# Patient Record
Sex: Female | Born: 1985 | Race: White | Hispanic: No | Smoking: Former smoker
Health system: Southern US, Community
[De-identification: ages and names within clinical notes are randomized; demographics above are authoritative.]

## PROBLEM LIST (undated history)

## (undated) ENCOUNTER — Inpatient Hospital Stay (HOSPITAL_COMMUNITY): Payer: Self-pay

## (undated) DIAGNOSIS — Z8751 Personal history of pre-term labor: Secondary | ICD-10-CM

## (undated) DIAGNOSIS — G4730 Sleep apnea, unspecified: Secondary | ICD-10-CM

## (undated) DIAGNOSIS — G709 Myoneural disorder, unspecified: Secondary | ICD-10-CM

## (undated) DIAGNOSIS — G500 Trigeminal neuralgia: Secondary | ICD-10-CM

## (undated) DIAGNOSIS — J45909 Unspecified asthma, uncomplicated: Secondary | ICD-10-CM

## (undated) DIAGNOSIS — R569 Unspecified convulsions: Secondary | ICD-10-CM

## (undated) DIAGNOSIS — D259 Leiomyoma of uterus, unspecified: Secondary | ICD-10-CM

## (undated) DIAGNOSIS — F419 Anxiety disorder, unspecified: Secondary | ICD-10-CM

## (undated) DIAGNOSIS — T7840XA Allergy, unspecified, initial encounter: Secondary | ICD-10-CM

## (undated) DIAGNOSIS — C801 Malignant (primary) neoplasm, unspecified: Secondary | ICD-10-CM

## (undated) DIAGNOSIS — G43909 Migraine, unspecified, not intractable, without status migrainosus: Secondary | ICD-10-CM

## (undated) DIAGNOSIS — K219 Gastro-esophageal reflux disease without esophagitis: Secondary | ICD-10-CM

## (undated) HISTORY — DX: Sleep apnea, unspecified: G47.30

## (undated) HISTORY — DX: Trigeminal neuralgia: G50.0

## (undated) HISTORY — DX: Unspecified asthma, uncomplicated: J45.909

## (undated) HISTORY — DX: Personal history of pre-term labor: Z87.51

## (undated) HISTORY — PX: OTHER SURGICAL HISTORY: SHX169

## (undated) HISTORY — DX: Anxiety disorder, unspecified: F41.9

## (undated) HISTORY — DX: Myoneural disorder, unspecified: G70.9

## (undated) HISTORY — PX: BREAST SURGERY: SHX581

## (undated) HISTORY — DX: Unspecified convulsions: R56.9

## (undated) HISTORY — DX: Gastro-esophageal reflux disease without esophagitis: K21.9

## (undated) HISTORY — DX: Allergy, unspecified, initial encounter: T78.40XA

## (undated) HISTORY — DX: Migraine, unspecified, not intractable, without status migrainosus: G43.909

## (undated) HISTORY — PX: COSMETIC SURGERY: SHX468

## (undated) HISTORY — DX: Malignant (primary) neoplasm, unspecified: C80.1

---
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
## 2006-01-05 ENCOUNTER — Ambulatory Visit: Payer: Self-pay | Admitting: Internal Medicine

## 2006-01-06 ENCOUNTER — Ambulatory Visit: Payer: Self-pay | Admitting: Internal Medicine

## 2006-01-07 ENCOUNTER — Ambulatory Visit: Payer: Self-pay | Admitting: Internal Medicine

## 2006-01-12 ENCOUNTER — Ambulatory Visit: Payer: Self-pay | Admitting: Internal Medicine

## 2006-01-22 ENCOUNTER — Ambulatory Visit: Payer: Self-pay | Admitting: Cardiology

## 2006-02-02 ENCOUNTER — Ambulatory Visit: Payer: Self-pay | Admitting: Internal Medicine

## 2006-02-26 ENCOUNTER — Ambulatory Visit: Payer: Self-pay | Admitting: Internal Medicine

## 2006-04-13 HISTORY — PX: BREAST ENHANCEMENT SURGERY: SHX7

## 2006-06-07 ENCOUNTER — Other Ambulatory Visit: Admission: RE | Admit: 2006-06-07 | Discharge: 2006-06-07 | Payer: Self-pay | Admitting: Obstetrics and Gynecology

## 2006-07-26 ENCOUNTER — Ambulatory Visit: Payer: Self-pay | Admitting: Internal Medicine

## 2006-11-06 ENCOUNTER — Encounter: Payer: Self-pay | Admitting: Internal Medicine

## 2006-11-06 DIAGNOSIS — J45909 Unspecified asthma, uncomplicated: Secondary | ICD-10-CM | POA: Insufficient documentation

## 2006-11-06 DIAGNOSIS — G43909 Migraine, unspecified, not intractable, without status migrainosus: Secondary | ICD-10-CM | POA: Insufficient documentation

## 2007-03-16 ENCOUNTER — Encounter: Payer: Self-pay | Admitting: Internal Medicine

## 2007-04-22 ENCOUNTER — Ambulatory Visit: Payer: Self-pay | Admitting: Internal Medicine

## 2007-04-22 DIAGNOSIS — J309 Allergic rhinitis, unspecified: Secondary | ICD-10-CM | POA: Insufficient documentation

## 2007-05-13 ENCOUNTER — Encounter: Payer: Self-pay | Admitting: Internal Medicine

## 2007-05-16 ENCOUNTER — Telehealth: Payer: Self-pay | Admitting: Internal Medicine

## 2007-05-23 ENCOUNTER — Encounter: Payer: Self-pay | Admitting: Internal Medicine

## 2007-06-29 ENCOUNTER — Telehealth: Payer: Self-pay | Admitting: Internal Medicine

## 2007-07-06 ENCOUNTER — Ambulatory Visit: Payer: Self-pay | Admitting: Internal Medicine

## 2007-07-07 ENCOUNTER — Telehealth: Payer: Self-pay | Admitting: Internal Medicine

## 2007-07-08 ENCOUNTER — Encounter: Payer: Self-pay | Admitting: Internal Medicine

## 2007-08-12 ENCOUNTER — Encounter: Payer: Self-pay | Admitting: Internal Medicine

## 2007-09-14 ENCOUNTER — Encounter: Payer: Self-pay | Admitting: Internal Medicine

## 2007-11-01 ENCOUNTER — Other Ambulatory Visit: Admission: RE | Admit: 2007-11-01 | Discharge: 2007-11-01 | Payer: Self-pay | Admitting: Obstetrics and Gynecology

## 2008-01-18 ENCOUNTER — Telehealth (INDEPENDENT_AMBULATORY_CARE_PROVIDER_SITE_OTHER): Payer: Self-pay | Admitting: *Deleted

## 2008-02-02 ENCOUNTER — Telehealth: Payer: Self-pay | Admitting: Internal Medicine

## 2008-07-27 ENCOUNTER — Ambulatory Visit: Payer: Self-pay | Admitting: Internal Medicine

## 2008-07-30 ENCOUNTER — Telehealth: Payer: Self-pay | Admitting: Internal Medicine

## 2008-08-06 ENCOUNTER — Telehealth: Payer: Self-pay | Admitting: Family Medicine

## 2008-08-07 ENCOUNTER — Telehealth: Payer: Self-pay | Admitting: Internal Medicine

## 2009-05-31 ENCOUNTER — Ambulatory Visit: Payer: Self-pay | Admitting: Internal Medicine

## 2009-05-31 DIAGNOSIS — J019 Acute sinusitis, unspecified: Secondary | ICD-10-CM | POA: Insufficient documentation

## 2010-01-31 ENCOUNTER — Telehealth: Payer: Self-pay | Admitting: Internal Medicine

## 2010-05-04 ENCOUNTER — Encounter: Payer: Self-pay | Admitting: Internal Medicine

## 2010-05-13 NOTE — Assessment & Plan Note (Signed)
Summary: SINUS PRESSURE,DRAINAGE-LB   Vital Signs:  Patient profile:   25 year old female Height:      63 inches Weight:      122 pounds BMI:     21.69 Temp:     98.5 degrees F oral Pulse rate:   88 / minute Pulse rhythm:   regular BP sitting:   100 / 70  (left arm) Cuff size:   regular  Vitals Entered By: Lamar Sprinkles, CMA (May 31, 2009 8:36 AM) CC: Pt c/o sinus pressure & pain. Symptoms started wednesday. She is taking dayquil, and has tried sudafed & benadryl w/no relief. No fever., URI symptoms   Primary Care Provider:  [facility]  CC:  Pt c/o sinus pressure & pain. Symptoms started wednesday. She is taking dayquil, and has tried sudafed & benadryl w/no relief. No fever., and URI symptoms.  History of Present Illness:  URI Symptoms      This is a 25 year old woman who presents with URI symptoms.  The symptoms began 3 days ago.  The severity is described as moderate.  The patient reports nasal congestion, purulent nasal discharge, dry cough, and sick contacts, but denies sore throat, productive cough, and earache.  The patient denies fever, stiff neck, dyspnea, wheezing, rash, vomiting, diarrhea, and use of an antipyretic.  The patient also reports severe fatigue.  The patient denies headache and muscle aches.  Risk factors for Strep sinusitis include unilateral facial pain, unilateral nasal discharge, and Strep exposure.  The patient denies the following risk factors for Strep sinusitis: tender adenopathy and absence of cough.    Preventive Screening-Counseling & Management  Alcohol-Tobacco     Alcohol drinks/day: 0     Smoking Status: never  Caffeine-Diet-Exercise     Does Patient Exercise: no  Hep-HIV-STD-Contraception     Hepatitis Risk: no risk noted     HIV Risk: no risk noted     STD Risk: no risk noted      Sexual History:  currently monogamous.        Drug Use:  no.        Blood Transfusions:  no.    Medications Prior to Update: 1)  Advair Diskus 100-50  Mcg/dose  Misc (Fluticasone-Salmeterol) .... Two Times A Day 2)  Ativan 1 Mg  Tabs (Lorazepam) .Marland Kitchen.. 1 Two Times A Day As Needed 3)  Maxalt-Mlt 10 Mg  Tbdp (Rizatriptan Benzoate) .... As Needed 4)  Topamax 50 Mg  Tabs (Topiramate) .... 6 Tabs At Hs 5)  Ortho Micronor 0.35 Mg  Tabs (Norethindrone (Contraceptive)) .... As Dirr 6)  Xopenex Hfa 45 Mcg/act Aero (Levalbuterol Tartrate) .... 2 Inh Two Times A Day As Needed 7)  Flexeril 10 Mg Tabs (Cyclobenzaprine Hcl) .Marland Kitchen.. 1 By Mouth Two Times A Day Prn 8)  Vitamin D3 1000 Unit  Tabs (Cholecalciferol) .Marland Kitchen.. 1 Qd 9)  Zyrtec-D Allergy & Congestion 5-120 Mg Xr12h-Tab (Cetirizine-Pseudoephedrine) 10)  Clindamycin Phosphate 1 % Gel (Clindamycin Phosphate) .... Use Bid 11)  Transderm-Scop 1.5 Mg Pt72 (Scopolamine Base) .Marland Kitchen.. 1 Q 3d Prn 12)  Hydrocodone-Acetaminophen 5-325 Mg Tabs (Hydrocodone-Acetaminophen) .Marland Kitchen.. 1 By Mouth Up To 4 Times Per Day As Needed For Pain  Current Medications (verified): 1)  Topamax 100 Mg Tabs (Topiramate) .... 3 By Mouth Qhs 2)  Vitamin D3 1000 Unit  Tabs (Cholecalciferol) .Marland Kitchen.. 1 Qd 3)  Zyrtec-D Allergy & Congestion 5-120 Mg Xr12h-Tab (Cetirizine-Pseudoephedrine) 4)  Clindamycin Phosphate 1 % Gel (Clindamycin Phosphate) .... Use Bid 5)  Trileptal  150 Mg Tabs (Oxcarbazepine) .... 3 At Bedtime 6)  Neurontin 800 Mg Tabs (Gabapentin) .... 3 To 4 Daily 7)  Vit E .... Daily  Allergies (verified): 1)  ! Sulfa 2)  ! Codeine 3)  ! * Amitriptyline 4)  ! Erythromycin 5)  ! Paxil  Past History:  Past Medical History: Reviewed history from 04/22/2007 and no changes required. Asthma Migraines Allergic rhinitis   Dr Thomasena Edis gyn Dr Neale Burly HA  Past Surgical History: Denies surgical history  Family History: Reviewed history from 04/22/2007 and no changes required. Family Hsitory Headaches  Social History: Reviewed history from 04/22/2007 and no changes required. Occupation: Pharm. Tech. Single Never Smoked Alcohol  use-no Drug use-no Regular exercise-no Hepatitis Risk:  no risk noted HIV Risk:  no risk noted STD Risk:  no risk noted Sexual History:  currently monogamous Blood Transfusions:  no Drug Use:  no Does Patient Exercise:  no  Review of Systems  The patient denies anorexia, fever, weight loss, weight gain, chest pain, dyspnea on exertion, headaches, hemoptysis, abdominal pain, hematuria, depression, and enlarged lymph nodes.    Physical Exam  General:  alert, well-developed, well-nourished, well-hydrated, appropriate dress, normal appearance, healthy-appearing, and cooperative to examination.   Head:  normocephalic, atraumatic, no abnormalities observed, and no abnormalities palpated.   Ears:  R ear normal and L ear normal.   Nose:  no airflow obstruction, no intranasal foreign body, no nasal polyps, no nasal mucosal lesions, no mucosal friability, no active bleeding or clots, no septum abnormalities, nasal dischargemucosal pallor, mucosal erythema, mucosal edema, L maxillary sinus tenderness, and R maxillary sinus tenderness.   Mouth:  Oral mucosa and oropharynx without lesions or exudates.  Teeth in good repair. Neck:  No deformities, masses, or tenderness noted.no cervical lymphadenopathy and no neck tenderness.   Lungs:  Normal respiratory effort, chest expands symmetrically. Lungs are clear to auscultation, no crackles or wheezes. Heart:  Normal rate and regular rhythm. S1 and S2 normal without gallop, murmur, click, rub or other extra sounds. Abdomen:  Bowel sounds positive,abdomen soft and non-tender without masses, organomegaly or hernias noted. Msk:  No deformity or scoliosis noted of thoracic or lumbar spine.   Pulses:  R and L carotid,radial,femoral,dorsalis pedis and posterior tibial pulses are full and equal bilaterally Extremities:  No clubbing, cyanosis, edema, or deformity noted with normal full range of motion of all joints.   Neurologic:  No cranial nerve deficits noted.  Station and gait are normal. Plantar reflexes are down-going bilaterally. DTRs are symmetrical throughout. Sensory, motor and coordinative functions appear intact. Skin:  Intact without suspicious lesions or rashes Cervical Nodes:  no anterior cervical adenopathy and no posterior cervical adenopathy.   Axillary Nodes:  no R axillary adenopathy and no L axillary adenopathy.   Psych:  Cognition and judgment appear intact. Alert and cooperative with normal attention span and concentration. No apparent delusions, illusions, hallucinations   Impression & Recommendations:  Problem # 1:  SINUSITIS- ACUTE-NOS (ICD-461.9) Assessment New  Her updated medication list for this problem includes:    Zyrtec-d Allergy & Congestion 5-120 Mg Xr12h-tab (Cetirizine-pseudoephedrine)    Amoxicillin 500 Mg Cap (Amoxicillin) .Marland Kitchen... Take 1 capsule by mouth three times a day x 10 days    Hydrocodone-homatropine 5-1.5 Mg/43ml Syrp (Hydrocodone-homatropine) .Marland Kitchen... 5-10 ml by mouth qid as needed for cough  Instructed on treatment. Call if symptoms persist or worsen.   Complete Medication List: 1)  Topamax 100 Mg Tabs (Topiramate) .... 3 by mouth qhs 2)  Vitamin D3 1000 Unit Tabs (Cholecalciferol) .Marland Kitchen.. 1 qd 3)  Zyrtec-d Allergy & Congestion 5-120 Mg Xr12h-tab (Cetirizine-pseudoephedrine) 4)  Clindamycin Phosphate 1 % Gel (Clindamycin phosphate) .... Use bid 5)  Trileptal 150 Mg Tabs (Oxcarbazepine) .... 3 at bedtime 6)  Neurontin 800 Mg Tabs (Gabapentin) .... 3 to 4 daily 7)  Vit E  .... Daily 8)  Amoxicillin 500 Mg Cap (Amoxicillin) .... Take 1 capsule by mouth three times a day x 10 days 9)  Hydrocodone-homatropine 5-1.5 Mg/54ml Syrp (Hydrocodone-homatropine) .... 5-10 ml by mouth qid as needed for cough  Patient Instructions: 1)  Please schedule a follow-up appointment in 2 weeks. 2)  Take your antibiotic as prescribed until ALL of it is gone, but stop if you develop a rash or swelling and contact our office as  soon as possible. 3)  Acute sinusitis symptoms for less than 10 days are not helped by antibiotics.Use warm moist compresses, and over the counter decongestants ( only as directed). Call if no improvement in 5-7 days, sooner if increasing pain, fever, or new symptoms. Prescriptions: HYDROCODONE-HOMATROPINE 5-1.5 MG/5ML SYRP (HYDROCODONE-HOMATROPINE) 5-10 ml by mouth QID as needed for cough  #6 ounces x 0   Entered and Authorized by:   Etta Grandchild MD   Signed by:   Etta Grandchild MD on 05/31/2009   Method used:   Print then Give to Patient   RxID:   [id number] AMOXICILLIN 500 MG CAP (AMOXICILLIN) Take 1 capsule by mouth three times a day X 10 days  #30 x 0   Entered and Authorized by:   Etta Grandchild MD   Signed by:   Etta Grandchild MD on 05/31/2009   Method used:   Print then Give to Patient   RxID:   [id number]

## 2010-05-13 NOTE — Letter (Signed)
Summary: Out of Work  [facility]  [address] [place], [place] [address]   Phone: [phone]  Fax: [phone]    May 31, 2009   Employee:  Sharon King    To Whom It May Concern:   For Medical reasons, please excuse the above named employee from work for the following dates:  Start:   05/30/09  End:   06/03/09  If you need additional information, please feel free to contact our office.         Sincerely,    Etta Grandchild MD

## 2010-05-13 NOTE — Progress Notes (Signed)
Summary: refill  Phone Note Call from Patient   Caller: Patient Summary of Call: Pt called wanting to know why Xopenex was denied. Initial call taken by: Alysia Penna,  January 31, 2010 1:39 PM  Follow-up for Phone Call        called back explained that xopenex was removed from active med list. Dr is out of office. will send refill request to another dr. advised need an office visit. pt stated it would not be anytime soon. Is it ok to refill Xopenex 2 inh 2 times a day. Follow-up by: Alysia Penna,  January 31, 2010 1:55 PM  Additional Follow-up for Phone Call Additional follow up Details #1::        yes Additional Follow-up by: Etta Grandchild MD,  January 31, 2010 2:00 PM    New/Updated Medications: XOPENEX HFA 45 MCG/ACT AERO (LEVALBUTEROL TARTRATE) 2 inh 2 times a day  Appended Document: refill sent refill to pharmacy.

## 2010-08-29 NOTE — Letter (Signed)
March 17, 2006    Mark Benecke  [address]  [place], [place] [address]   RE:  [name], [name]  MRN:  [id number]  /  DOB:  10-07-1985   To whom it may concern:   [name] has been a patient of mine.  She was diagnosed with  headaches related to trigeminal neuralgia this fall.  She is doing well  now and it is okay for her to attend school.    Sincerely,      Georgina Quint. Plotnikov, MD  Electronically Signed    AVP/MedQ  DD: 03/17/2006  DT: 03/17/2006  Job #: [id number]

## 2011-02-12 ENCOUNTER — Ambulatory Visit (INDEPENDENT_AMBULATORY_CARE_PROVIDER_SITE_OTHER): Payer: BC Managed Care – PPO | Admitting: Internal Medicine

## 2011-02-12 ENCOUNTER — Encounter: Payer: Self-pay | Admitting: Internal Medicine

## 2011-02-12 ENCOUNTER — Other Ambulatory Visit (INDEPENDENT_AMBULATORY_CARE_PROVIDER_SITE_OTHER): Payer: BC Managed Care – PPO

## 2011-02-12 VITALS — BP 92/68 | HR 96 | Temp 97.9°F | Resp 16 | Wt 126.0 lb

## 2011-02-12 DIAGNOSIS — R112 Nausea with vomiting, unspecified: Secondary | ICD-10-CM

## 2011-02-12 DIAGNOSIS — Z87898 Personal history of other specified conditions: Secondary | ICD-10-CM

## 2011-02-12 DIAGNOSIS — J029 Acute pharyngitis, unspecified: Secondary | ICD-10-CM

## 2011-02-12 LAB — CBC WITH DIFFERENTIAL/PLATELET
Basophils Absolute: 0 10*3/uL (ref 0.0–0.1)
Eosinophils Absolute: 0 10*3/uL (ref 0.0–0.7)
Lymphocytes Relative: 13.9 % (ref 12.0–46.0)
MCHC: 34 g/dL (ref 30.0–36.0)
Neutrophils Relative %: 78 % — ABNORMAL HIGH (ref 43.0–77.0)
RBC: 4.05 Mil/uL (ref 3.87–5.11)
RDW: 13.1 % (ref 11.5–14.6)

## 2011-02-12 LAB — MONONUCLEOSIS SCREEN: Mono Screen: NEGATIVE

## 2011-02-12 LAB — HEPATIC FUNCTION PANEL
Alkaline Phosphatase: 51 U/L (ref 39–117)
Bilirubin, Direct: 0 mg/dL (ref 0.0–0.3)

## 2011-02-12 LAB — BASIC METABOLIC PANEL
BUN: 7 mg/dL (ref 6–23)
CO2: 23 mEq/L (ref 19–32)
Chloride: 108 mEq/L (ref 96–112)
Creatinine, Ser: 0.6 mg/dL (ref 0.4–1.2)

## 2011-02-12 MED ORDER — PROMETHAZINE HCL 12.5 MG PO TABS: 12.5000 mg | ORAL_TABLET | Freq: Four times a day (QID) | ORAL | Status: DC | PRN

## 2011-02-12 MED ORDER — PROMETHAZINE HCL 50 MG RE SUPP: 50.0000 mg | Freq: Four times a day (QID) | RECTAL | Status: DC | PRN

## 2011-02-12 MED ORDER — PROMETHAZINE HCL 50 MG/ML IJ SOLN
50.0000 mg | Freq: Four times a day (QID) | INTRAMUSCULAR | Status: DC | PRN
  Administered 2011-02-12: 50 mg via INTRAMUSCULAR

## 2011-02-12 NOTE — Progress Notes (Signed)
  Subjective:    Patient ID: Sharon King, female    DOB: [birth year], 25 y.o.   MRN: [id number]  HPI  C/o ST x 1 wk. C/o n/v since last night; four times this am Was seen 4 d ago - taking a zpac  Review of Systems  Constitutional: Positive for fever, chills and fatigue. Negative for activity change, appetite change and unexpected weight change.  HENT: Positive for sore throat. Negative for congestion, mouth sores and sinus pressure.   Eyes: Negative for visual disturbance.  Respiratory: Negative for cough and chest tightness.   Gastrointestinal: Positive for vomiting. Negative for nausea, abdominal pain, diarrhea and abdominal distention.  Genitourinary: Negative for frequency, difficulty urinating and vaginal pain.  Musculoskeletal: Negative for back pain and gait problem.  Skin: Negative for pallor and rash.  Neurological: Positive for headaches. Negative for dizziness, tremors, weakness and numbness.  Psychiatric/Behavioral: Negative for confusion and sleep disturbance.   LMP 3 wks ago    Objective:   Physical Exam  Constitutional: She appears well-developed and well-nourished. No distress.       Looks tired  HENT:  Head: Normocephalic.  Right Ear: External ear normal.  Left Ear: External ear normal.  Nose: Nose normal.  Mouth/Throat: Oropharynx is clear and moist.       Not dry Eryth throat  Eyes: Conjunctivae are normal. Pupils are equal, round, and reactive to light. Right eye exhibits no discharge. Left eye exhibits no discharge.  Neck: Normal range of motion. Neck supple. No JVD present. No tracheal deviation present. No thyromegaly present.  Cardiovascular: Normal rate, regular rhythm and normal heart sounds.   Pulmonary/Chest: No stridor. No respiratory distress. She has no wheezes.  Abdominal: Soft. Bowel sounds are normal. She exhibits no distension and no mass. There is no tenderness. There is no rebound and no guarding.       Sensitive epig area    Musculoskeletal: She exhibits no edema and no tenderness.  Lymphadenopathy:    She has cervical adenopathy (mild B).  Neurological: She displays normal reflexes. No cranial nerve deficit. She exhibits normal muscle tone. Coordination normal.  Skin: No rash noted. No erythema.  Psychiatric: She has a normal mood and affect. Her behavior is normal. Judgment and thought content normal.          Assessment & Plan:

## 2011-02-12 NOTE — Patient Instructions (Signed)
Sips of Pedialite or any other rehydration solution To ER if worse

## 2011-02-13 ENCOUNTER — Telehealth: Payer: Self-pay | Admitting: Internal Medicine

## 2011-02-13 NOTE — Telephone Encounter (Signed)
Sharon King, please, inform patient that all labs are normal. Mono test is negative. Is she better? Thx

## 2011-02-13 NOTE — Telephone Encounter (Signed)
Pt informed. She feels a little better. She states she can keep some food/fluids down.

## 2011-02-15 ENCOUNTER — Encounter: Payer: Self-pay | Admitting: Internal Medicine

## 2011-02-15 NOTE — Assessment & Plan Note (Signed)
See Meds 

## 2011-02-15 NOTE — Assessment & Plan Note (Signed)
Continue with current prescription therapy as reflected on the Med list.  

## 2011-02-15 NOTE — Assessment & Plan Note (Signed)
Viral vs other. Not pregnant Labs  See instructions

## 2011-02-21 ENCOUNTER — Other Ambulatory Visit: Payer: Self-pay | Admitting: Internal Medicine

## 2011-04-10 ENCOUNTER — Telehealth: Payer: Self-pay | Admitting: *Deleted

## 2011-04-10 NOTE — Telephone Encounter (Signed)
Rf req for Lorazepam 1 mg 1 po bid prn # 60. Last filled 11.29.12. Ok to Rf?

## 2011-04-10 NOTE — Telephone Encounter (Signed)
OK to fill this prescription with additional refills x0. Use as little as poss  Needs OV. Thank you!

## 2011-04-13 MED ORDER — LORAZEPAM 1 MG PO TABS: 1.0000 mg | ORAL_TABLET | Freq: Two times a day (BID) | ORAL | Status: DC

## 2011-04-13 NOTE — Telephone Encounter (Signed)
Rf phoned in. Will have schedulers call pt to arrange OV.

## 2011-04-28 ENCOUNTER — Ambulatory Visit: Payer: BC Managed Care – PPO | Admitting: Internal Medicine

## 2011-08-05 ENCOUNTER — Telehealth: Payer: Self-pay | Admitting: Internal Medicine

## 2011-08-05 NOTE — Telephone Encounter (Signed)
Received copies from [facility] ,on 08/05/11 . Forwarded 3  pages to Dr. Martha Clan review.

## 2011-10-18 ENCOUNTER — Other Ambulatory Visit: Payer: Self-pay | Admitting: Internal Medicine

## 2012-04-03 ENCOUNTER — Emergency Department (HOSPITAL_COMMUNITY): Payer: Self-pay

## 2012-04-03 ENCOUNTER — Encounter (HOSPITAL_COMMUNITY): Payer: Self-pay | Admitting: *Deleted

## 2012-04-03 ENCOUNTER — Emergency Department (HOSPITAL_COMMUNITY)
Admission: EM | Admit: 2012-04-03 | Discharge: 2012-04-03 | Disposition: A | Discharge status: Home / Self Care | Payer: Self-pay | Attending: Emergency Medicine | Admitting: Emergency Medicine

## 2012-04-03 DIAGNOSIS — R221 Localized swelling, mass and lump, neck: Secondary | ICD-10-CM | POA: Insufficient documentation

## 2012-04-03 DIAGNOSIS — Z79899 Other long term (current) drug therapy: Secondary | ICD-10-CM | POA: Insufficient documentation

## 2012-04-03 DIAGNOSIS — R0789 Other chest pain: Secondary | ICD-10-CM | POA: Insufficient documentation

## 2012-04-03 DIAGNOSIS — T4995XA Adverse effect of unspecified topical agent, initial encounter: Secondary | ICD-10-CM | POA: Insufficient documentation

## 2012-04-03 DIAGNOSIS — R21 Rash and other nonspecific skin eruption: Secondary | ICD-10-CM | POA: Insufficient documentation

## 2012-04-03 DIAGNOSIS — R220 Localized swelling, mass and lump, head: Secondary | ICD-10-CM | POA: Insufficient documentation

## 2012-04-03 DIAGNOSIS — R0602 Shortness of breath: Secondary | ICD-10-CM | POA: Insufficient documentation

## 2012-04-03 DIAGNOSIS — Z3202 Encounter for pregnancy test, result negative: Secondary | ICD-10-CM | POA: Insufficient documentation

## 2012-04-03 DIAGNOSIS — G43909 Migraine, unspecified, not intractable, without status migrainosus: Secondary | ICD-10-CM | POA: Insufficient documentation

## 2012-04-03 DIAGNOSIS — T7840XA Allergy, unspecified, initial encounter: Secondary | ICD-10-CM

## 2012-04-03 LAB — URINALYSIS, ROUTINE W REFLEX MICROSCOPIC
Glucose, UA: NEGATIVE mg/dL
Hgb urine dipstick: NEGATIVE
Leukocytes, UA: NEGATIVE
Specific Gravity, Urine: 1.004 — ABNORMAL LOW (ref 1.005–1.030)
Urobilinogen, UA: 0.2 mg/dL (ref 0.0–1.0)

## 2012-04-03 MED ORDER — FAMOTIDINE 20 MG PO TABS: 20.0000 mg | ORAL_TABLET | Freq: Two times a day (BID) | ORAL | Status: DC

## 2012-04-03 MED ORDER — PREDNISONE 20 MG PO TABS
60.0000 mg | ORAL_TABLET | Freq: Once | ORAL | Status: AC
  Administered 2012-04-03: 60 mg via ORAL
  Filled 2012-04-03: qty 3

## 2012-04-03 MED ORDER — DIPHENHYDRAMINE HCL 25 MG PO TABS: 25.0000 mg | ORAL_TABLET | Freq: Four times a day (QID) | ORAL | Status: DC

## 2012-04-03 MED ORDER — FAMOTIDINE 20 MG PO TABS
20.0000 mg | ORAL_TABLET | Freq: Once | ORAL | Status: AC
  Administered 2012-04-03: 20 mg via ORAL
  Filled 2012-04-03: qty 1

## 2012-04-03 MED ORDER — PREDNISONE 50 MG PO TABS: ORAL_TABLET | ORAL | Status: DC

## 2012-04-03 NOTE — ED Notes (Signed)
Patient transported to X-ray 

## 2012-04-03 NOTE — ED Notes (Signed)
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  Pt had banana, lean cuisine lunch, and hostess snack.  Pt states faces started to swell, redness to face, throat tightness.  Pt took 75mg  of benadryl.

## 2012-04-03 NOTE — ED Provider Notes (Signed)
History     CSN: [id number]  Arrival date & time 04/03/12  1702   First MD Initiated Contact with Patient 04/03/12 1715      Chief Complaint  Patient presents with  . Allergic Reaction    (Consider location/radiation/quality/duration/timing/severity/associated sxs/prior treatment) HPI Comments: Patient presents with allergic reaction which she thinks is due to food. She started having redness and swelling to her face and throat tightness immediately after eating lunch today. 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 Her redness is feeling better. She still has some tightness in her chest and difficulty breathing. She denies any problems swallowing or vomiting. No abdominal pain. No recent changes with her medications.  The history is provided by the patient and a relative.    Past Medical History  Diagnosis Date  . Migraine     History reviewed. No pertinent past surgical history.  Family History  Problem Relation Age of Onset  . Hyperlipidemia Mother   . Arthritis Mother   . Depression Mother     History  Substance Use Topics  . Smoking status: Never Smoker   . Smokeless tobacco: Not on file  . Alcohol Use: No    OB History    Grav Para Term Preterm Abortions TAB SAB Ect Mult Living                  Review of Systems  Constitutional: Negative for activity change and appetite change.  HENT: Negative for congestion, sore throat, rhinorrhea and trouble swallowing.   Respiratory: Positive for chest tightness and shortness of breath. Negative for cough.   Cardiovascular: Positive for chest pain.  Gastrointestinal: Negative for nausea, vomiting and abdominal pain.  Genitourinary: Negative for dysuria.  Musculoskeletal: Negative for back pain.  Skin: Positive for rash.  Neurological: Negative for dizziness, weakness and headaches.  A complete 10 system review of systems was obtained and all systems are negative except as noted in the  HPI and PMH.    Allergies  Codeine; Paroxetine; Erythromycin; and Sulfonamide derivatives  Home Medications   Current Outpatient Rx  Name  Route  Sig  Dispense  Refill  . DIPHENHYDRAMINE HCL 25 MG PO TABS   Oral   Take 75 mg by mouth once.         . DROSPIRENONE-ETHINYL ESTRADIOL 3-0.02 MG PO TABS   Oral   Take 1 tablet by mouth daily.           Marland Kitchen GABAPENTIN 400 MG PO CAPS   Oral   Take 400-800 mg by mouth 3 (three) times daily.           Marland Kitchen LORAZEPAM 1 MG PO TABS   Oral   Take 1 tablet (1 mg total) by mouth 2 (two) times daily.   60 tablet   0   . TOPIRAMATE 100 MG PO TABS   Oral   Take 500 mg by mouth 2 (two) times daily.         Marland Kitchen DIPHENHYDRAMINE HCL 25 MG PO TABS   Oral   Take 1 tablet (25 mg total) by mouth every 6 (six) hours.   20 tablet   0   . FAMOTIDINE 20 MG PO TABS   Oral   Take 1 tablet (20 mg total) by mouth 2 (two) times daily.   30 tablet   0   . PREDNISONE 50 MG PO TABS      1 tablet PO daily  5 tablet   0   . PROMETHAZINE HCL 12.5 MG PO TABS   Oral   Take 1-2 tablets (12.5-25 mg total) by mouth every 6 (six) hours as needed for nausea.   60 tablet   0   . PROMETHAZINE HCL 50 MG RE SUPP   Rectal   Place 1 suppository (50 mg total) rectally every 6 (six) hours as needed for nausea.   12 each   0     BP 120/74  Pulse 88  Temp 98.3 F (36.8 C) (Oral)  Resp 11  SpO2 100%  LMP 04/01/2012  Physical Exam  Constitutional: She appears well-developed and well-nourished. No distress.  HENT:  Head: Normocephalic and atraumatic.  Mouth/Throat: Oropharynx is clear and moist. No oropharyngeal exudate.       Erythema to the cheeks, forehead and nose. Oropharynx clear, no asymmetry, uvula midline  Eyes: Conjunctivae normal and EOM are normal. Pupils are equal, round, and reactive to light.  Neck: Normal range of motion. Neck supple.  Cardiovascular: Normal rate, regular rhythm and normal heart sounds.   No murmur  heard. Pulmonary/Chest: Effort normal and breath sounds normal. No respiratory distress. She has no wheezes.  Abdominal: Soft. There is no tenderness. There is no rebound and no guarding.  Musculoskeletal: Normal range of motion. She exhibits no edema and no tenderness.  Neurological: No cranial nerve deficit. She exhibits normal muscle tone. Coordination normal.  Skin: No rash noted.    ED Course  Procedures (including critical care time)  Labs Reviewed  URINALYSIS, ROUTINE W REFLEX MICROSCOPIC - Abnormal; Notable for the following:    Specific Gravity, Urine 1.004 (*)     All other components within normal limits  PREGNANCY, URINE   Dg Chest 2 View  04/03/2012  *RADIOLOGY REPORT*  Clinical Data: Chest pain and shortness of breath  CHEST - 2 VIEW  Comparison: None.  Findings: Lungs clear.  Heart size and pulmonary vascularity are normal.  No adenopathy.  No bone lesions.  IMPRESSION: No abnormality noted.   Original Report Authenticated By: Bretta Bang, M.D.      1. Allergic reaction       MDM  Presumed allergic reaction with redness and swelling to face and throat. Vital stable, no distress, lungs clear. No evidence of airway compromise. Oropharynx appears normal. Speaking in full sentences  Will get steroids, antihistamines, chest x-ray reassess  Patient monitored in the ED for 1 hour. She had improvement in her facial redness and swelling. Her lungs remain clear. Her throat remains clear.  Treat for allergic reaction with antihistamines and steroids. Return precautions discussed.   Date: 04/03/2012  Rate: 94  Rhythm: normal sinus rhythm  QRS Axis: normal  Intervals: normal  ST/T Wave abnormalities: normal  Conduction Disutrbances:none  Narrative Interpretation:   Old EKG Reviewed: none available      Glynn Octave, MD 04/03/12 2348

## 2012-04-03 NOTE — ED Notes (Signed)
EDP at bedside  

## 2012-11-23 ENCOUNTER — Emergency Department: Payer: Self-pay | Admitting: Unknown Physician Specialty

## 2012-11-23 LAB — CBC WITH DIFFERENTIAL/PLATELET
Basophil #: 0 10*3/uL (ref 0.0–0.1)
Eosinophil #: 0.1 10*3/uL (ref 0.0–0.7)
Eosinophil %: 0.7 %
HCT: 38.2 % (ref 35.0–47.0)
Lymphocyte #: 4.2 10*3/uL — ABNORMAL HIGH (ref 1.0–3.6)
Lymphocyte %: 51.4 %
MCH: 31.8 pg (ref 26.0–34.0)
MCHC: 34.7 g/dL (ref 32.0–36.0)
MCV: 92 fL (ref 80–100)
Monocyte %: 8 %
Neutrophil %: 39.5 %
Platelet: 262 10*3/uL (ref 150–440)
RBC: 4.16 10*6/uL (ref 3.80–5.20)
RDW: 12.7 % (ref 11.5–14.5)

## 2012-11-23 LAB — URINALYSIS, COMPLETE
Ketone: NEGATIVE
Nitrite: NEGATIVE
Protein: NEGATIVE
RBC,UR: <1 /HPF (ref 0–5)
Specific Gravity: 1.006 (ref 1.003–1.030)

## 2012-11-23 LAB — DRUG SCREEN, URINE
Benzodiazepine, Ur Scrn: NEGATIVE (ref ?–200)
MDMA (Ecstasy)Ur Screen: NEGATIVE (ref ?–500)
Opiate, Ur Screen: NEGATIVE (ref ?–300)
Phencyclidine (PCP) Ur S: NEGATIVE (ref ?–25)

## 2012-11-23 LAB — COMPREHENSIVE METABOLIC PANEL
Anion Gap: 10 (ref 7–16)
BUN: 7 mg/dL (ref 7–18)
Bilirubin,Total: 0.2 mg/dL (ref 0.2–1.0)
Chloride: 109 mmol/L — ABNORMAL HIGH (ref 98–107)
Co2: 23 mmol/L (ref 21–32)
Creatinine: 0.76 mg/dL (ref 0.60–1.30)
EGFR (Non-African Amer.): >60
Glucose: 87 mg/dL (ref 65–99)
Osmolality: 280 (ref 275–301)
SGPT (ALT): 21 U/L (ref 12–78)
Total Protein: 7.4 g/dL (ref 6.4–8.2)

## 2013-09-15 ENCOUNTER — Ambulatory Visit: Payer: Self-pay | Admitting: Family Medicine

## 2013-10-04 ENCOUNTER — Ambulatory Visit: Payer: Self-pay | Admitting: Obstetrics & Gynecology

## 2013-10-20 ENCOUNTER — Ambulatory Visit: Payer: Self-pay | Admitting: Family Medicine

## 2013-10-24 ENCOUNTER — Emergency Department: Payer: Self-pay | Admitting: Emergency Medicine

## 2013-10-24 LAB — COMPREHENSIVE METABOLIC PANEL
ALK PHOS: 57 U/L
ANION GAP: 7 (ref 7–16)
Albumin: 3.5 g/dL (ref 3.4–5.0)
BUN: 9 mg/dL (ref 7–18)
Bilirubin,Total: 0.4 mg/dL (ref 0.2–1.0)
CALCIUM: 8.8 mg/dL (ref 8.5–10.1)
CHLORIDE: 109 mmol/L — AB (ref 98–107)
CO2: 24 mmol/L (ref 21–32)
CREATININE: 0.63 mg/dL (ref 0.60–1.30)
EGFR (African American): >60
EGFR (Non-African Amer.): >60
Glucose: 106 mg/dL — ABNORMAL HIGH (ref 65–99)
Osmolality: 279 (ref 275–301)
POTASSIUM: 3.5 mmol/L (ref 3.5–5.1)
SGOT(AST): 8 U/L — ABNORMAL LOW (ref 15–37)
SGPT (ALT): 10 U/L — ABNORMAL LOW (ref 12–78)
Sodium: 140 mmol/L (ref 136–145)
Total Protein: 7.2 g/dL (ref 6.4–8.2)

## 2013-10-24 LAB — URINALYSIS, COMPLETE
BLOOD: NEGATIVE
Bilirubin,UR: NEGATIVE
Glucose,UR: NEGATIVE mg/dL (ref 0–75)
Ketone: NEGATIVE
LEUKOCYTE ESTERASE: NEGATIVE
Nitrite: NEGATIVE
Ph: 6 (ref 4.5–8.0)
Protein: NEGATIVE
SPECIFIC GRAVITY: 1.014 (ref 1.003–1.030)
Squamous Epithelial: 3

## 2013-10-24 LAB — CBC
HCT: 38.9 % (ref 35.0–47.0)
HGB: 13 g/dL (ref 12.0–16.0)
MCH: 31.3 pg (ref 26.0–34.0)
MCHC: 33.5 g/dL (ref 32.0–36.0)
MCV: 94 fL (ref 80–100)
Platelet: 258 10*3/uL (ref 150–440)
RBC: 4.16 10*6/uL (ref 3.80–5.20)
RDW: 12.8 % (ref 11.5–14.5)
WBC: 7.5 10*3/uL (ref 3.6–11.0)

## 2013-10-24 LAB — GC/CHLAMYDIA PROBE AMP

## 2013-10-24 LAB — LIPASE, BLOOD: Lipase: 162 U/L (ref 73–393)

## 2013-10-24 LAB — WET PREP, GENITAL

## 2013-10-26 ENCOUNTER — Ambulatory Visit: Payer: Self-pay | Admitting: Obstetrics & Gynecology

## 2013-11-29 ENCOUNTER — Ambulatory Visit: Payer: Self-pay | Admitting: Obstetrics & Gynecology

## 2013-11-29 DIAGNOSIS — Z01419 Encounter for gynecological examination (general) (routine) without abnormal findings: Secondary | ICD-10-CM

## 2013-12-02 ENCOUNTER — Emergency Department: Payer: Self-pay | Admitting: Emergency Medicine

## 2013-12-26 ENCOUNTER — Ambulatory Visit: Payer: Self-pay | Admitting: Family Medicine

## 2013-12-29 ENCOUNTER — Ambulatory Visit: Payer: Self-pay | Admitting: Obstetrics & Gynecology

## 2014-01-04 ENCOUNTER — Ambulatory Visit: Payer: Self-pay | Admitting: Obstetrics & Gynecology

## 2014-01-04 DIAGNOSIS — Z01419 Encounter for gynecological examination (general) (routine) without abnormal findings: Secondary | ICD-10-CM

## 2014-12-13 ENCOUNTER — Ambulatory Visit (INDEPENDENT_AMBULATORY_CARE_PROVIDER_SITE_OTHER): Payer: Self-pay | Admitting: Obstetrics and Gynecology

## 2014-12-13 ENCOUNTER — Encounter: Payer: Self-pay | Admitting: Obstetrics and Gynecology

## 2014-12-13 ENCOUNTER — Encounter (INDEPENDENT_AMBULATORY_CARE_PROVIDER_SITE_OTHER): Payer: Self-pay

## 2014-12-13 VITALS — BP 120/82 | HR 116 | Wt 148.0 lb

## 2014-12-13 DIAGNOSIS — Z124 Encounter for screening for malignant neoplasm of cervix: Secondary | ICD-10-CM

## 2014-12-13 DIAGNOSIS — Z23 Encounter for immunization: Secondary | ICD-10-CM

## 2014-12-13 DIAGNOSIS — O99351 Diseases of the nervous system complicating pregnancy, first trimester: Secondary | ICD-10-CM

## 2014-12-13 DIAGNOSIS — Z3401 Encounter for supervision of normal first pregnancy, first trimester: Secondary | ICD-10-CM

## 2014-12-13 DIAGNOSIS — Z113 Encounter for screening for infections with a predominantly sexual mode of transmission: Secondary | ICD-10-CM

## 2014-12-13 DIAGNOSIS — O99350 Diseases of the nervous system complicating pregnancy, unspecified trimester: Secondary | ICD-10-CM

## 2014-12-13 DIAGNOSIS — Z3400 Encounter for supervision of normal first pregnancy, unspecified trimester: Secondary | ICD-10-CM | POA: Insufficient documentation

## 2014-12-13 DIAGNOSIS — G40909 Epilepsy, unspecified, not intractable, without status epilepticus: Secondary | ICD-10-CM | POA: Insufficient documentation

## 2014-12-13 NOTE — Addendum Note (Signed)
Addended by: Lin Landsman C on: 12/13/2014 11:08 AM   Modules accepted: Orders

## 2014-12-13 NOTE — Progress Notes (Signed)
Bedside ultrasound shows [redacted]w[redacted]d fetus with heartbeat.

## 2014-12-13 NOTE — Assessment & Plan Note (Signed)
BABYSCRIPTS PATIENT: [X] initial, [ ] 12, [ ] 20, [ ] 28, [ ] 32, [ ] 36, [ ] 38, [ ] 39, [ ] 40 

## 2014-12-13 NOTE — Progress Notes (Signed)
   Subjective:    Sharon King is a G1P0 [redacted]w[redacted]d being seen today for her first obstetrical visit.  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 Patient has been followed by a neurologist who stopped anti-seizure meds recently. last seizure was summer 2015. Patient does intend to breast feed. Pregnancy history fully reviewed.  Patient reports nausea and is requesting diclegis.  Filed Vitals:   12/13/14 1007  BP: 120/82  Pulse: 116  Weight: 148 lb (67.132 kg)    HISTORY: OB History  Gravida Para Term Preterm AB SAB TAB Ectopic Multiple Living  1             # Outcome Date GA Lbr Len/2nd Weight Sex Delivery Anes PTL Lv  1 Current              Past Medical History  Diagnosis Date  . Migraine   . Seizures   . Migraines    Past Surgical History  Procedure Laterality Date  . Breast enhancement surgery  2008  . Wisdom teeth     Family History  Problem Relation Age of Onset  . Hyperlipidemia Mother   . Arthritis Mother   . Depression Mother      Exam    Uterus:     Pelvic Exam:    Perineum: No Hemorrhoids, Normal Perineum   Vulva: normal   Vagina:  normal mucosa, normal discharge   pH:    Cervix: closed/thick and long   Adnexa: no mass, fullness, tenderness   Bony Pelvis: gynecoid  System: Breast:  normal appearance, no masses or tenderness   Skin: normal coloration and turgor, no rashes    Neurologic: oriented, no focal deficits   Extremities: normal strength, tone, and muscle mass   HEENT extra ocular movement intact   Mouth/Teeth mucous membranes moist, pharynx normal without lesions and dental hygiene good   Neck supple and no masses   Cardiovascular: regular rate and rhythm   Respiratory:  chest clear, no wheezing, crepitations, rhonchi, normal symmetric air entry   Abdomen: soft, non-tender; bowel sounds normal; no masses,  no organomegaly   Urinary:       Assessment:    Pregnancy: G1P0 Patient Active Problem List   Diagnosis Date Noted  . Supervision of normal first pregnancy 12/13/2014  . Seizure disorder in pregnancy, antepartum 12/13/2014  . Pharyngitis 02/12/2011  . Nausea & vomiting 02/12/2011  . SINUSITIS- ACUTE-NOS 05/31/2009  . ALLERGIC RHINITIS 04/22/2007  . ASTHMA 11/06/2006  . MIGRAINES, HX OF 11/06/2006        Plan:     Initial labs drawn. Prenatal vitamins. Problem list reviewed and updated. Genetic Screening discussed First Screen: ordered.  Ultrasound discussed; fetal survey: requested. Diclegis samples provided  Follow up in 4 weeks. 50% of 30 min visit spent on counseling and coordination of care.     Odel Schmid 12/13/2014

## 2014-12-14 LAB — PRESCRIPTION MONITORING PROFILE (19 PANEL)
AMPHETAMINE/METH: NEGATIVE ng/mL
BARBITURATE SCREEN, URINE: NEGATIVE ng/mL
Benzodiazepine Screen, Urine: NEGATIVE ng/mL
Buprenorphine, Urine: NEGATIVE ng/mL
CANNABINOID SCRN UR: NEGATIVE ng/mL
CARISOPRODOL, URINE: NEGATIVE ng/mL
Cocaine Metabolites: NEGATIVE ng/mL
Creatinine, Urine: 19.09 mg/dL — ABNORMAL LOW (ref >20.0)
Fentanyl, Ur: NEGATIVE ng/mL
MDMA URINE: NEGATIVE ng/mL
METHAQUALONE SCREEN (URINE): NEGATIVE ng/mL
Meperidine, Ur: NEGATIVE ng/mL
Methadone Screen, Urine: NEGATIVE ng/mL
NITRITES URINE, INITIAL: NEGATIVE ug/mL
OPIATE SCREEN, URINE: NEGATIVE ng/mL
Oxycodone Screen, Ur: NEGATIVE ng/mL
PHENCYCLIDINE, UR: NEGATIVE ng/mL
PROPOXYPHENE: NEGATIVE ng/mL
TAPENTADOLUR: NEGATIVE ng/mL
Tramadol Scrn, Ur: NEGATIVE ng/mL
Zolpidem, Urine: NEGATIVE ng/mL
pH, Initial: 5.7 pH (ref 4.5–8.9)

## 2014-12-14 LAB — OBSTETRIC PANEL
Antibody Screen: NEGATIVE
BASOS ABS: 0 10*3/uL (ref 0.0–0.1)
Basophils Relative: 0 % (ref 0–1)
Eosinophils Absolute: 0.1 10*3/uL (ref 0.0–0.7)
Eosinophils Relative: 1 % (ref 0–5)
HEMATOCRIT: 40.7 % (ref 36.0–46.0)
HEP B S AG: NEGATIVE
Hemoglobin: 13.2 g/dL (ref 12.0–15.0)
LYMPHS PCT: 32 % (ref 12–46)
Lymphs Abs: 3 10*3/uL (ref 0.7–4.0)
MCH: 31.7 pg (ref 26.0–34.0)
MCHC: 32.4 g/dL (ref 30.0–36.0)
MCV: 97.8 fL (ref 78.0–100.0)
MONO ABS: 0.8 10*3/uL (ref 0.1–1.0)
MPV: 10.4 fL (ref 8.6–12.4)
Monocytes Relative: 8 % (ref 3–12)
NEUTROS PCT: 59 % (ref 43–77)
Neutro Abs: 5.5 10*3/uL (ref 1.7–7.7)
Platelets: 404 10*3/uL — ABNORMAL HIGH (ref 150–400)
RBC: 4.16 MIL/uL (ref 3.87–5.11)
RDW: 14.2 % (ref 11.5–15.5)
RH TYPE: POSITIVE
Rubella: 4.89 Index — ABNORMAL HIGH (ref <0.90)
WBC: 9.4 10*3/uL (ref 4.0–10.5)

## 2014-12-14 LAB — CULTURE, OB URINE
COLONY COUNT: NO GROWTH
ORGANISM ID, BACTERIA: NO GROWTH

## 2014-12-14 LAB — CYTOLOGY - PAP

## 2014-12-25 ENCOUNTER — Telehealth: Payer: Self-pay | Admitting: Family Medicine

## 2014-12-25 DIAGNOSIS — O219 Vomiting of pregnancy, unspecified: Secondary | ICD-10-CM

## 2014-12-25 MED ORDER — PROMETHAZINE HCL 12.5 MG PO TABS: 12.5000 mg | ORAL_TABLET | Freq: Four times a day (QID) | ORAL | Status: DC | PRN

## 2014-12-25 NOTE — Telephone Encounter (Signed)
I have spoke to patient and called in phenergan.  Pt ask for 12.5 mg.  I made patient aware it may make her drowsy.

## 2014-12-25 NOTE — Telephone Encounter (Signed)
Pt was wondering if something could be called in for n/v. Pt can't used Diclegis, and prefers Zofran. She uses the pharmacy at [facility] in [place] on [address]. Please advise.

## 2015-01-10 ENCOUNTER — Encounter: Payer: Self-pay | Admitting: Family Medicine

## 2015-01-15 ENCOUNTER — Ambulatory Visit (INDEPENDENT_AMBULATORY_CARE_PROVIDER_SITE_OTHER): Payer: BLUE CROSS/BLUE SHIELD | Admitting: Family Medicine

## 2015-01-15 VITALS — BP 116/78 | HR 92 | Wt 156.0 lb

## 2015-01-15 DIAGNOSIS — Z3401 Encounter for supervision of normal first pregnancy, first trimester: Secondary | ICD-10-CM

## 2015-01-15 NOTE — Patient Instructions (Signed)
Second Trimester of Pregnancy The second trimester is from week 13 through week 28, months 4 through 6. The second trimester is often a time when you feel your best. Your body has also adjusted to being pregnant, and you begin to feel better physically. Usually, morning sickness has lessened or quit completely, you may have more energy, and you may have an increase in appetite. The second trimester is also a time when the fetus is growing rapidly. At the end of the sixth month, the fetus is about 9 inches long and weighs about 1 pounds. You will likely begin to feel the baby move (quickening) between 18 and 20 weeks of the pregnancy. BODY CHANGES Your body goes through many changes during pregnancy. The changes vary from woman to woman.   Your weight will continue to increase. You will notice your lower abdomen bulging out.  You may begin to get stretch marks on your hips, abdomen, and breasts.  You may develop headaches that can be relieved by medicines approved by your health care provider.  You may urinate more often because the fetus is pressing on your bladder.  You may develop or continue to have heartburn as a result of your pregnancy.  You may develop constipation because certain hormones are causing the muscles that push waste through your intestines to slow down.  You may develop hemorrhoids or swollen, bulging veins (varicose veins).  You may have back pain because of the weight gain and pregnancy hormones relaxing your joints between the bones in your pelvis and as a result of a shift in weight and the muscles that support your balance.  Your breasts will continue to grow and be tender.  Your gums may bleed and may be sensitive to brushing and flossing.  Dark spots or blotches (chloasma, mask of pregnancy) may develop on your face. This will likely fade after the baby is born.  A dark line from your belly button to the pubic area (linea nigra) may appear. This will likely  fade after the baby is born.  You may have changes in your hair. These can include thickening of your hair, rapid growth, and changes in texture. Some women also have hair loss during or after pregnancy, or hair that feels dry or thin. Your hair will most likely return to normal after your baby is born. WHAT TO EXPECT AT YOUR PRENATAL VISITS During a routine prenatal visit:  You will be weighed to make sure you and the fetus are growing normally.  Your blood pressure will be taken.  Your abdomen will be measured to track your baby's growth.  The fetal heartbeat will be listened to.  Any test results from the previous visit will be discussed. Your health care provider may ask you:  How you are feeling.  If you are feeling the baby move.  If you have had any abnormal symptoms, such as leaking fluid, bleeding, severe headaches, or abdominal cramping.  If you have any questions. Other tests that may be performed during your second trimester include:  Blood tests that check for:  Low iron levels (anemia).  Gestational diabetes (between 24 and 28 weeks).  Rh antibodies.  Urine tests to check for infections, diabetes, or protein in the urine.  An ultrasound to confirm the proper growth and development of the baby.  An amniocentesis to check for possible genetic problems.  Fetal screens for spina bifida and Down syndrome. HOME CARE INSTRUCTIONS   Avoid all smoking, herbs, alcohol, and unprescribed   drugs. These chemicals affect the formation and growth of the baby.  Follow your health care provider's instructions regarding medicine use. There are medicines that are either safe or unsafe to take during pregnancy.  Exercise only as directed by your health care provider. Experiencing uterine cramps is a good sign to stop exercising.  Continue to eat regular, healthy meals.  Wear a good support bra for breast tenderness.  Do not use hot tubs, steam rooms, or saunas.  Wear  your seat belt at all times when driving.  Avoid raw meat, uncooked cheese, cat litter boxes, and soil used by cats. These carry germs that can cause birth defects in the baby.  Take your prenatal vitamins.  Try taking a stool softener (if your health care provider approves) if you develop constipation. Eat more high-fiber foods, such as fresh vegetables or fruit and whole grains. Drink plenty of fluids to keep your urine clear or pale yellow.  Take warm sitz baths to soothe any pain or discomfort caused by hemorrhoids. Use hemorrhoid cream if your health care provider approves.  If you develop varicose veins, wear support hose. Elevate your feet for 15 minutes, 3-4 times a day. Limit salt in your diet.  Avoid heavy lifting, wear low heel shoes, and practice good posture.  Rest with your legs elevated if you have leg cramps or low back pain.  Visit your dentist if you have not gone yet during your pregnancy. Use a soft toothbrush to brush your teeth and be gentle when you floss.  A sexual relationship may be continued unless your health care provider directs you otherwise.  Continue to go to all your prenatal visits as directed by your health care provider. SEEK MEDICAL CARE IF:   You have dizziness.  You have mild pelvic cramps, pelvic pressure, or nagging pain in the abdominal area.  You have persistent nausea, vomiting, or diarrhea.  You have a bad smelling vaginal discharge.  You have pain with urination. SEEK IMMEDIATE MEDICAL CARE IF:   You have a fever.  You are leaking fluid from your vagina.  You have spotting or bleeding from your vagina.  You have severe abdominal cramping or pain.  You have rapid weight gain or loss.  You have shortness of breath with chest pain.  You notice sudden or extreme swelling of your face, hands, ankles, feet, or legs.  You have not felt your baby move in over an hour.  You have severe headaches that do not go away with  medicine.  You have vision changes. Document Released: 03/24/2001 Document Revised: 04/04/2013 Document Reviewed: 05/31/2012 [employer] Patient Information 2015 [employer]. This information is not intended to replace advice given to you by your health care provider. Make sure you discuss any questions you have with your health care provider.  Breastfeeding Deciding to breastfeed is one of the best choices you can make for you and your baby. A change in hormones during pregnancy causes your breast tissue to grow and increases the number and size of your milk ducts. These hormones also allow proteins, sugars, and fats from your blood supply to make breast milk in your milk-producing glands. Hormones prevent breast milk from being released before your baby is born as well as prompt milk flow after birth. Once breastfeeding has begun, thoughts of your baby, as well as his or her sucking or crying, can stimulate the release of milk from your milk-producing glands.  BENEFITS OF BREASTFEEDING For Your Baby  Your first   milk (colostrum) helps your baby's digestive system function better.   There are antibodies in your milk that help your baby fight off infections.   Your baby has a lower incidence of asthma, allergies, and sudden infant death syndrome.   The nutrients in breast milk are better for your baby than infant formulas and are designed uniquely for your baby's needs.   Breast milk improves your baby's brain development.   Your baby is less likely to develop other conditions, such as childhood obesity, asthma, or type 2 diabetes mellitus.  For You   Breastfeeding helps to create a very special bond between you and your baby.   Breastfeeding is convenient. Breast milk is always available at the correct temperature and costs nothing.   Breastfeeding helps to burn calories and helps you lose the weight gained during pregnancy.   Breastfeeding makes your uterus contract to its  prepregnancy size faster and slows bleeding (lochia) after you give birth.   Breastfeeding helps to lower your risk of developing type 2 diabetes mellitus, osteoporosis, and breast or ovarian cancer later in life. SIGNS THAT YOUR BABY IS HUNGRY Early Signs of Hunger  Increased alertness or activity.  Stretching.  Movement of the head from side to side.  Movement of the head and opening of the mouth when the corner of the mouth or cheek is stroked (rooting).  Increased sucking sounds, smacking lips, cooing, sighing, or squeaking.  Hand-to-mouth movements.  Increased sucking of fingers or hands. Late Signs of Hunger  Fussing.  Intermittent crying. Extreme Signs of Hunger Signs of extreme hunger will require calming and consoling before your baby will be able to breastfeed successfully. Do not wait for the following signs of extreme hunger to occur before you initiate breastfeeding:   Restlessness.  A loud, strong cry.   Screaming. BREASTFEEDING BASICS Breastfeeding Initiation  Find a comfortable place to sit or lie down, with your neck and back well supported.  Place a pillow or rolled up blanket under your baby to bring him or her to the level of your breast (if you are seated). Nursing pillows are specially designed to help support your arms and your baby while you breastfeed.  Make sure that your baby's abdomen is facing your abdomen.   Gently massage your breast. With your fingertips, massage from your chest wall toward your nipple in a circular motion. This encourages milk flow. You may need to continue this action during the feeding if your milk flows slowly.  Support your breast with 4 fingers underneath and your thumb above your nipple. Make sure your fingers are well away from your nipple and your baby's mouth.   Stroke your baby's lips gently with your finger or nipple.   When your baby's mouth is open wide enough, quickly bring your baby to your breast,  placing your entire nipple and as much of the colored area around your nipple (areola) as possible into your baby's mouth.   More areola should be visible above your baby's upper lip than below the lower lip.   Your baby's tongue should be between his or her lower gum and your breast.   Ensure that your baby's mouth is correctly positioned around your nipple (latched). Your baby's lips should create a seal on your breast and be turned out (everted).  It is common for your baby to suck about 2-3 minutes in order to start the flow of breast milk. Latching Teaching your baby how to latch on to your breast   properly is very important. An improper latch can cause nipple pain and decreased milk supply for you and poor weight gain in your baby. Also, if your baby is not latched onto your nipple properly, he or she may swallow some air during feeding. This can make your baby fussy. Burping your baby when you switch breasts during the feeding can help to get rid of the air. However, teaching your baby to latch on properly is still the best way to prevent fussiness from swallowing air while breastfeeding. Signs that your baby has successfully latched on to your nipple:    Silent tugging or silent sucking, without causing you pain.   Swallowing heard between every 3-4 sucks.    Muscle movement above and in front of his or her ears while sucking.  Signs that your baby has not successfully latched on to nipple:   Sucking sounds or smacking sounds from your baby while breastfeeding.  Nipple pain. If you think your baby has not latched on correctly, slip your finger into the corner of your baby's mouth to break the suction and place it between your baby's gums. Attempt breastfeeding initiation again. Signs of Successful Breastfeeding Signs from your baby:   A gradual decrease in the number of sucks or complete cessation of sucking.   Falling asleep.   Relaxation of his or her body.    Retention of a small amount of milk in his or her mouth.   Letting go of your breast by himself or herself. Signs from you:  Breasts that have increased in firmness, weight, and size 1-3 hours after feeding.   Breasts that are softer immediately after breastfeeding.  Increased milk volume, as well as a change in milk consistency and color by the fifth day of breastfeeding.   Nipples that are not sore, cracked, or bleeding. Signs That Your Baby is Getting Enough Milk  Wetting at least 3 diapers in a 24-hour period. The urine should be clear and pale yellow by age 5 days.  At least 3 stools in a 24-hour period by age 5 days. The stool should be soft and yellow.  At least 3 stools in a 24-hour period by age 7 days. The stool should be seedy and yellow.  No loss of weight greater than 10% of birth weight during the first 3 days of age.  Average weight gain of 4-7 ounces (113-198 g) per week after age 4 days.  Consistent daily weight gain by age 5 days, without weight loss after the age of 2 weeks. After a feeding, your baby may spit up a small amount. This is common. BREASTFEEDING FREQUENCY AND DURATION Frequent feeding will help you make more milk and can prevent sore nipples and breast engorgement. Breastfeed when you feel the need to reduce the fullness of your breasts or when your baby shows signs of hunger. This is called "breastfeeding on demand." Avoid introducing a pacifier to your baby while you are working to establish breastfeeding (the first 4-6 weeks after your baby is born). After this time you may choose to use a pacifier. Research has shown that pacifier use during the first year of a baby's life decreases the risk of sudden infant death syndrome (SIDS). Allow your baby to feed on each breast as long as he or she wants. Breastfeed until your baby is finished feeding. When your baby unlatches or falls asleep while feeding from the first breast, offer the second breast.  Because newborns are often sleepy in the   first few weeks of life, you may need to awaken your baby to get him or her to feed. Breastfeeding times will vary from baby to baby. However, the following rules can serve as a guide to help you ensure that your baby is properly fed:  Newborns (babies 4 weeks of age or younger) may breastfeed every 1-3 hours.  Newborns should not go longer than 3 hours during the day or 5 hours during the night without breastfeeding.  You should breastfeed your baby a minimum of 8 times in a 24-hour period until you begin to introduce solid foods to your baby at around 6 months of age. BREAST MILK PUMPING Pumping and storing breast milk allows you to ensure that your baby is exclusively fed your breast milk, even at times when you are unable to breastfeed. This is especially important if you are going back to work while you are still breastfeeding or when you are not able to be present during feedings. Your lactation consultant can give you guidelines on how long it is safe to store breast milk.  A breast pump is a machine that allows you to pump milk from your breast into a sterile bottle. The pumped breast milk can then be stored in a refrigerator or freezer. Some breast pumps are operated by hand, while others use electricity. Ask your lactation consultant which type will work best for you. Breast pumps can be purchased, but some hospitals and breastfeeding support groups lease breast pumps on a monthly basis. A lactation consultant can teach you how to hand express breast milk, if you prefer not to use a pump.  CARING FOR YOUR BREASTS WHILE YOU BREASTFEED Nipples can become dry, cracked, and sore while breastfeeding. The following recommendations can help keep your breasts moisturized and healthy:  Avoid using soap on your nipples.   Wear a supportive bra. Although not required, special nursing bras and tank tops are designed to allow access to your breasts for  breastfeeding without taking off your entire bra or top. Avoid wearing underwire-style bras or extremely tight bras.  Air dry your nipples for 3-4minutes after each feeding.   Use only cotton bra pads to absorb leaked breast milk. Leaking of breast milk between feedings is normal.   Use lanolin on your nipples after breastfeeding. Lanolin helps to maintain your skin's normal moisture barrier. If you use pure lanolin, you do not need to wash it off before feeding your baby again. Pure lanolin is not toxic to your baby. You may also hand express a few drops of breast milk and gently massage that milk into your nipples and allow the milk to air dry. In the first few weeks after giving birth, some women experience extremely full breasts (engorgement). Engorgement can make your breasts feel heavy, warm, and tender to the touch. Engorgement peaks within 3-5 days after you give birth. The following recommendations can help ease engorgement:  Completely empty your breasts while breastfeeding or pumping. You may want to start by applying warm, moist heat (in the shower or with warm water-soaked hand towels) just before feeding or pumping. This increases circulation and helps the milk flow. If your baby does not completely empty your breasts while breastfeeding, pump any extra milk after he or she is finished.  Wear a snug bra (nursing or regular) or tank top for 1-2 days to signal your body to slightly decrease milk production.  Apply ice packs to your breasts, unless this is too uncomfortable for you.    Make sure that your baby is latched on and positioned properly while breastfeeding. If engorgement persists after 48 hours of following these recommendations, contact your health care provider or a lactation consultant. OVERALL HEALTH CARE RECOMMENDATIONS WHILE BREASTFEEDING  Eat healthy foods. Alternate between meals and snacks, eating 3 of each per day. Because what you eat affects your breast milk,  some of the foods may make your baby more irritable than usual. Avoid eating these foods if you are sure that they are negatively affecting your baby.  Drink milk, fruit juice, and water to satisfy your thirst (about 10 glasses a day).   Rest often, relax, and continue to take your prenatal vitamins to prevent fatigue, stress, and anemia.  Continue breast self-awareness checks.  Avoid chewing and smoking tobacco.  Avoid alcohol and drug use. Some medicines that may be harmful to your baby can pass through breast milk. It is important to ask your health care provider before taking any medicine, including all over-the-counter and prescription medicine as well as vitamin and herbal supplements. It is possible to become pregnant while breastfeeding. If birth control is desired, ask your health care provider about options that will be safe for your baby. SEEK MEDICAL CARE IF:   You feel like you want to stop breastfeeding or have become frustrated with breastfeeding.  You have painful breasts or nipples.  Your nipples are cracked or bleeding.  Your breasts are red, tender, or warm.  You have a swollen area on either breast.  You have a fever or chills.  You have nausea or vomiting.  You have drainage other than breast milk from your nipples.  Your breasts do not become full before feedings by the fifth day after you give birth.  You feel sad and depressed.  Your baby is too sleepy to eat well.  Your baby is having trouble sleeping.   Your baby is wetting less than 3 diapers in a 24-hour period.  Your baby has less than 3 stools in a 24-hour period.  Your baby's skin or the white part of his or her eyes becomes yellow.   Your baby is not gaining weight by 5 days of age. SEEK IMMEDIATE MEDICAL CARE IF:   Your baby is overly tired (lethargic) and does not want to wake up and feed.  Your baby develops an unexplained fever. Document Released: 03/30/2005 Document Revised:  04/04/2013 Document Reviewed: 09/21/2012 ExitCare Patient Information 2015 [employer]. This information is not intended to replace advice given to you by your health care provider. Make sure you discuss any questions you have with your health care provider.  

## 2015-01-15 NOTE — Progress Notes (Signed)
C/O occasional RLP

## 2015-01-15 NOTE — Progress Notes (Signed)
Subjective:  Sharon King is a 29 y.o. G1P0 at [redacted]w[redacted]d being seen today for ongoing prenatal care.  Patient reports no complaints.  Contractions: Not present.  Vag. Bleeding: None. Movement: Absent. Denies leaking of fluid.   The following portions of the patient's history were reviewed and updated as appropriate: allergies, current medications, past family history, past medical history, past social history, past surgical history and problem list.   Objective:   Filed Vitals:   01/15/15 0934  BP: 116/78  Pulse: 92  Weight: 156 lb (70.761 kg)    Fetal Status: Fetal Heart Rate (bpm): + on [place]   Movement: Absent     General:  Alert, oriented and cooperative. Patient is in no acute distress.  Skin: Skin is warm and dry. No rash noted.   Cardiovascular: Normal heart rate noted  Respiratory: Normal respiratory effort, no problems with respiration noted  Abdomen: Soft, gravid, appropriate for gestational age. Pain/Pressure: Present     Pelvic: Vag. Bleeding: None Vag D/C Character: Thin   Cervical exam deferred        Extremities: Normal range of motion.  Edema: None  Mental Status: Normal mood and affect. Normal behavior. Normal judgment and thought content.   Urinalysis: Urine Protein: Negative Urine Glucose: Negative  Assessment and Plan:  Pregnancy: G1P0 at [redacted]w[redacted]d  1. Encounter for supervision of normal first pregnancy in first trimester Continue routine prenatal care. For first screen--scheduled  Please refer to After Visit Summary for other counseling recommendations.  No Follow-up on file.   Donnamae Jude, MD

## 2015-01-31 ENCOUNTER — Ambulatory Visit (HOSPITAL_COMMUNITY): Admission: RE | Admit: 2015-01-31 | Payer: BLUE CROSS/BLUE SHIELD | Source: Ambulatory Visit

## 2015-01-31 ENCOUNTER — Encounter (HOSPITAL_COMMUNITY): Payer: Self-pay

## 2015-01-31 ENCOUNTER — Ambulatory Visit (HOSPITAL_COMMUNITY)
Admission: RE | Admit: 2015-01-31 | Discharge: 2015-01-31 | Disposition: A | Discharge status: Home / Self Care | Payer: BLUE CROSS/BLUE SHIELD | Source: Ambulatory Visit | Attending: Family Medicine | Admitting: Family Medicine

## 2015-01-31 DIAGNOSIS — Z3401 Encounter for supervision of normal first pregnancy, first trimester: Secondary | ICD-10-CM | POA: Insufficient documentation

## 2015-02-01 ENCOUNTER — Telehealth: Payer: Self-pay | Admitting: *Deleted

## 2015-02-01 DIAGNOSIS — O219 Vomiting of pregnancy, unspecified: Secondary | ICD-10-CM

## 2015-02-01 MED ORDER — PROMETHAZINE HCL 12.5 MG PO TABS: 12.5000 mg | ORAL_TABLET | Freq: Four times a day (QID) | ORAL | Status: DC | PRN

## 2015-02-01 NOTE — Telephone Encounter (Signed)
-----   Message from Sharon King sent at 02/01/2015 10:14 AM EDT ----- Regarding: Rx Refill Contact: [phone] [name] called and wants to know if she can get an refill on her promethazine  [facility] on [address] [facility])

## 2015-02-01 NOTE — Telephone Encounter (Signed)
Sent in refills to patients pharmacy. Left message on patient voicemail that the prescription was at pharmacy.

## 2015-02-14 ENCOUNTER — Telehealth: Payer: Self-pay | Admitting: *Deleted

## 2015-02-14 NOTE — Telephone Encounter (Signed)
Pt had 1st Trimester Screen at [facility], unable to visualize NT due to baby position, recommended quad screen.  Pt calling to see if she needed to come in sooner.  Informed her that we can draw it bt 15-[redacted]wks gestation and it could be done at her next visit in December. Pt acknowledged.

## 2015-03-04 ENCOUNTER — Other Ambulatory Visit: Payer: Self-pay | Admitting: Family Medicine

## 2015-03-13 ENCOUNTER — Telehealth: Payer: Self-pay | Admitting: *Deleted

## 2015-03-13 DIAGNOSIS — Z3402 Encounter for supervision of normal first pregnancy, second trimester: Secondary | ICD-10-CM

## 2015-03-13 NOTE — Telephone Encounter (Signed)
Order needed for Anatomy US, placed order per protocol.  Will schedule appt for pt.

## 2015-03-14 ENCOUNTER — Inpatient Hospital Stay (HOSPITAL_COMMUNITY)
Admission: AD | Admit: 2015-03-14 | Discharge: 2015-03-14 | Disposition: A | Discharge status: Home / Self Care | Payer: BLUE CROSS/BLUE SHIELD | Source: Ambulatory Visit | Attending: Obstetrics & Gynecology | Admitting: Obstetrics & Gynecology

## 2015-03-14 ENCOUNTER — Encounter (HOSPITAL_COMMUNITY): Payer: Self-pay | Admitting: *Deleted

## 2015-03-14 ENCOUNTER — Ambulatory Visit (HOSPITAL_BASED_OUTPATIENT_CLINIC_OR_DEPARTMENT_OTHER)
Admit: 2015-03-14 | Discharge: 2015-03-14 | Disposition: A | Discharge status: Home / Self Care | Payer: BLUE CROSS/BLUE SHIELD | Attending: Obstetrics and Gynecology | Admitting: Obstetrics and Gynecology

## 2015-03-14 ENCOUNTER — Encounter (HOSPITAL_COMMUNITY): Payer: BLUE CROSS/BLUE SHIELD

## 2015-03-14 DIAGNOSIS — M5431 Sciatica, right side: Secondary | ICD-10-CM | POA: Insufficient documentation

## 2015-03-14 DIAGNOSIS — M79606 Pain in leg, unspecified: Secondary | ICD-10-CM

## 2015-03-14 DIAGNOSIS — O26892 Other specified pregnancy related conditions, second trimester: Secondary | ICD-10-CM | POA: Insufficient documentation

## 2015-03-14 DIAGNOSIS — O9989 Other specified diseases and conditions complicating pregnancy, childbirth and the puerperium: Secondary | ICD-10-CM | POA: Diagnosis not present

## 2015-03-14 DIAGNOSIS — Z3A19 19 weeks gestation of pregnancy: Secondary | ICD-10-CM | POA: Diagnosis not present

## 2015-03-14 DIAGNOSIS — M79661 Pain in right lower leg: Secondary | ICD-10-CM | POA: Diagnosis not present

## 2015-03-14 LAB — URINALYSIS, ROUTINE W REFLEX MICROSCOPIC
BILIRUBIN URINE: NEGATIVE
Glucose, UA: NEGATIVE mg/dL
HGB URINE DIPSTICK: NEGATIVE
Ketones, ur: NEGATIVE mg/dL
Nitrite: NEGATIVE
PH: 6.5 (ref 5.0–8.0)
Protein, ur: NEGATIVE mg/dL

## 2015-03-14 LAB — URINE MICROSCOPIC-ADD ON: RBC / HPF: NONE SEEN RBC/hpf (ref 0–5)

## 2015-03-14 MED ORDER — CYCLOBENZAPRINE HCL 5 MG PO TABS
5.0000 mg | ORAL_TABLET | Freq: Once | ORAL | Status: AC
  Administered 2015-03-14: 5 mg via ORAL
  Filled 2015-03-14: qty 1

## 2015-03-14 MED ORDER — IBUPROFEN 600 MG PO TABS
600.0000 mg | ORAL_TABLET | Freq: Once | ORAL | Status: AC
  Administered 2015-03-14: 600 mg via ORAL
  Filled 2015-03-14: qty 1

## 2015-03-14 NOTE — MAU Provider Note (Signed)
History     CSN: [id number]  Arrival date and time: 03/14/15 [id number]   First Provider Initiated Contact with Patient 03/14/15 0935      No chief complaint on file.  HPI   Ms.Sharon King is a 29 y.o. female G1P0 at [redacted]w[redacted]d presenting to MAU with numbness and pain in her right leg; its her whole leg that is numb and painful.   This has happened many other times; this time was the worst it felt. The numbness and pain has only ever occurred while she was pregnant.   The patient was able to walk into the hospital without difficulty.   The symptoms feels like when your legs goes to sleep, like the feeling of tingling.   It occurred yesterday while she was at work, she works at [employer] and stands on her feet a lot.   The pain is mostly at her hip and in her in her lower back and shoots down her leg.   OB History    Gravida Para Term Preterm AB TAB SAB Ectopic Multiple Living   1               Past Medical History  Diagnosis Date  . Migraine   . Migraines   . Seizures ([place])     Last seizure 2014    Past Surgical History  Procedure Laterality Date  . Breast enhancement surgery  2008  . Wisdom teeth      Family History  Problem Relation Age of Onset  . Hyperlipidemia Mother   . Arthritis Mother   . Depression Mother     Social History  Substance Use Topics  . Smoking status: Former Research scientist (life sciences)  . Smokeless tobacco: Former Systems developer  . Alcohol Use: Yes     Comment: rare-stopped with pregnancy    Allergies:  Allergies  Allergen Reactions  . Paroxetine Other (See Comments)    Heart palpatations  . Erythromycin Rash  . Sulfonamide Derivatives Rash    Prescriptions prior to admission  Medication Sig Dispense Refill Last Dose  . diphenhydrAMINE (BENADRYL) 25 MG tablet Take 50 mg by mouth 2 (two) times daily as needed for allergies or sleep.    03/14/2015 at Unknown time  . Melatonin 3 MG CAPS Take 3 mg by mouth at bedtime as needed (for sleep).    prn  . promethazine  (PHENERGAN) 12.5 MG tablet TAKE 1 TABLET BY MOUTH EVERY 6 HOURS AS NEEDED FOR NAUSEA/VOMITING 30 tablet 1 Past Week at Unknown time    Results for orders placed or performed during the hospital encounter of 03/14/15 (from the past 48 hour(s))  Urinalysis, Routine w reflex microscopic (not at [facility])     Status: Abnormal   Collection Time: 03/14/15  8:44 AM  Result Value Ref Range   Color, Urine YELLOW YELLOW   APPearance CLEAR CLEAR   Specific Gravity, Urine <1.005 (L) 1.005 - 1.030   pH 6.5 5.0 - 8.0   Glucose, UA NEGATIVE NEGATIVE mg/dL   Hgb urine dipstick NEGATIVE NEGATIVE   Bilirubin Urine NEGATIVE NEGATIVE   Ketones, ur NEGATIVE NEGATIVE mg/dL   Protein, ur NEGATIVE NEGATIVE mg/dL   Nitrite NEGATIVE NEGATIVE   Leukocytes, UA TRACE (A) NEGATIVE  Urine microscopic-add on     Status: Abnormal   Collection Time: 03/14/15  8:44 AM  Result Value Ref Range   Squamous Epithelial / LPF 0-5 (A) NONE SEEN   WBC, UA 0-5 0 - 5 WBC/hpf   RBC / HPF  NONE SEEN 0 - 5 RBC/hpf   Bacteria, UA RARE (A) NONE SEEN   No results found.  Review of Systems  Constitutional: Negative for fever and chills.  Cardiovascular: Negative for chest pain.  Neurological: Negative for weakness and headaches.   Physical Exam   Blood pressure 115/71, pulse 101, temperature 98.5 F (36.9 C), resp. rate 18, last menstrual period 10/09/2014.  Physical Exam  Constitutional: She is oriented to person, place, and time. She appears well-developed and well-nourished. No distress.  HENT:  Head: Normocephalic.  Neck: Normal range of motion. Neck supple.  Cardiovascular: Regular rhythm, intact distal pulses and normal pulses.  Exam reveals no decreased pulses.   Respiratory: Effort normal.  Musculoskeletal: Normal range of motion. She exhibits tenderness.       Back:       Arms: Right calf 14.5 Left calf 14 No erythema   Neurological: She is alert and oriented to person, place, and time. She has normal reflexes.  She displays normal reflexes.  Skin: Skin is warm. She is not diaphoretic.  Psychiatric: Her behavior is normal.   MAU Course  Procedures  None  MDM + fetal heart tones via doppler.   Right calf 14.5 Left calf 14 Pedal pulses strong bilaterally.    Ibuprofen 600 mg PO Heat pack   Vascular lab unable to come to MAU to perform study.  Patient to go to [facility] Vascular lab for venous doppler.  Discussed plan of care with Dr. Gala Romney.   Venous doppler negative for DVT in right calf. Patient called and made aware @1645   Assessment and Plan   A:  1. Sciatic nerve pain, right   2. Leg pain    P:  Patient to leave MAU and go to [facility] for Vascular study to evaluate for DVT; I will call patient with results Patient to return to MAU if symptoms worsen Sciatic nerve exercises given and discussed Ok to take ibuprofen for 2-3 days as needed for pain.  Alternate heat and cold Keep appointments with [facility].    Lezlie Lye, NP 03/14/2015 4:49 PM

## 2015-03-14 NOTE — Progress Notes (Signed)
Vascular Lab Preliminary Results  Right lower extremity venous duplex completed. No evidence of deep or superficial vein thrombosis in the right lower extremity and left common femoral vein.

## 2015-03-14 NOTE — MAU Note (Signed)
Pt presents to MAU with complaints of numbness in her right leg for three hours. Denies any vaginal bleeding or abnormal discharge.

## 2015-03-14 NOTE — Discharge Instructions (Signed)
Sciatica With Rehab The sciatic nerve runs from the back down the leg and is responsible for sensation and control of the muscles in the back (posterior) side of the thigh, lower leg, and foot. Sciatica is a condition that is characterized by inflammation of this nerve.  SYMPTOMS   Signs of nerve damage, including numbness and/or weakness along the posterior side of the lower extremity.  Pain in the back of the thigh that may also travel down the leg.  Pain that worsens when sitting for long periods of time.  Occasionally, pain in the back or buttock. CAUSES  Inflammation of the sciatic nerve is the cause of sciatica. The inflammation is due to something irritating the nerve. Common sources of irritation include:  Sitting for long periods of time.  Direct trauma to the nerve.  Arthritis of the spine.  Herniated or ruptured disk.  Slipping of the vertebrae (spondylolisthesis).  Pressure from soft tissues, such as muscles or ligament-like tissue (fascia). RISK INCREASES WITH:  Sports that place pressure or stress on the spine (football or weightlifting).  Poor strength and flexibility.  Failure to warm up properly before activity.  Family history of low back pain or disk disorders.  Previous back injury or surgery.  Poor body mechanics, especially when lifting, or poor posture. PREVENTION   Warm up and stretch properly before activity.  Maintain physical fitness:  Strength, flexibility, and endurance.  Cardiovascular fitness.  Learn and use proper technique, especially with posture and lifting. When possible, have coach correct improper technique.  Avoid activities that place stress on the spine. PROGNOSIS If treated properly, then sciatica usually resolves within 6 weeks. However, occasionally surgery is necessary.  RELATED COMPLICATIONS   Permanent nerve damage, including pain, numbness, tingle, or weakness.  Chronic back pain.  Risks of surgery: infection,  bleeding, nerve damage, or damage to surrounding tissues. TREATMENT Treatment initially involves resting from any activities that aggravate your symptoms. The use of ice and medication may help reduce pain and inflammation. The use of strengthening and stretching exercises may help reduce pain with activity. These exercises may be performed at home or with referral to a therapist. A therapist may recommend further treatments, such as transcutaneous electronic nerve stimulation (TENS) or ultrasound. Your caregiver may recommend corticosteroid injections to help reduce inflammation of the sciatic nerve. If symptoms persist despite non-surgical (conservative) treatment, then surgery may be recommended. MEDICATION  If pain medication is necessary, then nonsteroidal anti-inflammatory medications, such as aspirin and ibuprofen, or other minor pain relievers, such as acetaminophen, are often recommended.  Do not take pain medication for 7 days before surgery.  Prescription pain relievers may be given if deemed necessary by your caregiver. Use only as directed and only as much as you need.  Ointments applied to the skin may be helpful.  Corticosteroid injections may be given by your caregiver. These injections should be reserved for the most serious cases, because they may only be given a certain number of times. HEAT AND COLD  Cold treatment (icing) relieves pain and reduces inflammation. Cold treatment should be applied for 10 to 15 minutes every 2 to 3 hours for inflammation and pain and immediately after any activity that aggravates your symptoms. Use ice packs or massage the area with a piece of ice (ice massage).  Heat treatment may be used prior to performing the stretching and strengthening activities prescribed by your caregiver, physical therapist, or athletic trainer. Use a heat pack or soak the injury in warm water.  SEEK MEDICAL CARE IF:  Treatment seems to offer no benefit, or the condition  worsens.  Any medications produce adverse side effects. EXERCISES  RANGE OF MOTION (ROM) AND STRETCHING EXERCISES - Sciatica Most people with sciatic will find that their symptoms worsen with either excessive bending forward (flexion) or arching at the low back (extension). The exercises which will help resolve your symptoms will focus on the opposite motion. Your physician, physical therapist or athletic trainer will help you determine which exercises will be most helpful to resolve your low back pain. Do not complete any exercises without first consulting with your clinician. Discontinue any exercises which worsen your symptoms until you speak to your clinician. If you have pain, numbness or tingling which travels down into your buttocks, leg or foot, the goal of the therapy is for these symptoms to move closer to your back and eventually resolve. Occasionally, these leg symptoms will get better, but your low back pain may worsen; this is typically an indication of progress in your rehabilitation. Be certain to be very alert to any changes in your symptoms and the activities in which you participated in the 24 hours prior to the change. Sharing this information with your clinician will allow him/her to most efficiently treat your condition. These exercises may help you when beginning to rehabilitate your injury. Your symptoms may resolve with or without further involvement from your physician, physical therapist or athletic trainer. While completing these exercises, remember:   Restoring tissue flexibility helps normal motion to return to the joints. This allows healthier, less painful movement and activity.  An effective stretch should be held for at least 30 seconds.  A stretch should never be painful. You should only feel a gentle lengthening or release in the stretched tissue. FLEXION RANGE OF MOTION AND STRETCHING EXERCISES: STRETCH - Flexion, Single Knee to Chest   Lie on a firm bed or floor  with both legs extended in front of you.  Keeping one leg in contact with the floor, bring your opposite knee to your chest. Hold your leg in place by either grabbing behind your thigh or at your knee.  Pull until you feel a gentle stretch in your low back. Hold __________ seconds.  Slowly release your grasp and repeat the exercise with the opposite side. Repeat __________ times. Complete this exercise __________ times per day.  STRETCH - Flexion, Double Knee to Chest  Lie on a firm bed or floor with both legs extended in front of you.  Keeping one leg in contact with the floor, bring your opposite knee to your chest.  Tense your stomach muscles to support your back and then lift your other knee to your chest. Hold your legs in place by either grabbing behind your thighs or at your knees.  Pull both knees toward your chest until you feel a gentle stretch in your low back. Hold __________ seconds.  Tense your stomach muscles and slowly return one leg at a time to the floor. Repeat __________ times. Complete this exercise __________ times per day.  STRETCH - Low Trunk Rotation   Lie on a firm bed or floor. Keeping your legs in front of you, bend your knees so they are both pointed toward the ceiling and your feet are flat on the floor.  Extend your arms out to the side. This will stabilize your upper body by keeping your shoulders in contact with the floor.  Gently and slowly drop both knees together to one side until  you feel a gentle stretch in your low back. Hold for __________ seconds.  Tense your stomach muscles to support your low back as you bring your knees back to the starting position. Repeat the exercise to the other side. Repeat __________ times. Complete this exercise __________ times per day  EXTENSION RANGE OF MOTION AND FLEXIBILITY EXERCISES: STRETCH - Extension, Prone on Elbows  Lie on your stomach on the floor, a bed will be too soft. Place your palms about shoulder  width apart and at the height of your head.  Place your elbows under your shoulders. If this is too painful, stack pillows under your chest.  Allow your body to relax so that your hips drop lower and make contact more completely with the floor.  Hold this position for __________ seconds.  Slowly return to lying flat on the floor. Repeat __________ times. Complete this exercise __________ times per day.  RANGE OF MOTION - Extension, Prone Press Ups  Lie on your stomach on the floor, a bed will be too soft. Place your palms about shoulder width apart and at the height of your head.  Keeping your back as relaxed as possible, slowly straighten your elbows while keeping your hips on the floor. You may adjust the placement of your hands to maximize your comfort. As you gain motion, your hands will come more underneath your shoulders.  Hold this position __________ seconds.  Slowly return to lying flat on the floor. Repeat __________ times. Complete this exercise __________ times per day.  STRENGTHENING EXERCISES - Sciatica  These exercises may help you when beginning to rehabilitate your injury. These exercises should be done near your "sweet spot." This is the neutral, low-back arch, somewhere between fully rounded and fully arched, that is your least painful position. When performed in this safe range of motion, these exercises can be used for people who have either a flexion or extension based injury. These exercises may resolve your symptoms with or without further involvement from your physician, physical therapist or athletic trainer. While completing these exercises, remember:   Muscles can gain both the endurance and the strength needed for everyday activities through controlled exercises.  Complete these exercises as instructed by your physician, physical therapist or athletic trainer. Progress with the resistance and repetition exercises only as your caregiver advises.  You may  experience muscle soreness or fatigue, but the pain or discomfort you are trying to eliminate should never worsen during these exercises. If this pain does worsen, stop and make certain you are following the directions exactly. If the pain is still present after adjustments, discontinue the exercise until you can discuss the trouble with your clinician. STRENGTHENING - Deep Abdominals, Pelvic Tilt   Lie on a firm bed or floor. Keeping your legs in front of you, bend your knees so they are both pointed toward the ceiling and your feet are flat on the floor.  Tense your lower abdominal muscles to press your low back into the floor. This motion will rotate your pelvis so that your tail bone is scooping upwards rather than pointing at your feet or into the floor.  With a gentle tension and even breathing, hold this position for __________ seconds. Repeat __________ times. Complete this exercise __________ times per day.  STRENGTHENING - Abdominals, Crunches   Lie on a firm bed or floor. Keeping your legs in front of you, bend your knees so they are both pointed toward the ceiling and your feet are flat on the  floor. Cross your arms over your chest.  Slightly tip your chin down without bending your neck.  Tense your abdominals and slowly lift your trunk high enough to just clear your shoulder blades. Lifting higher can put excessive stress on the low back and does not further strengthen your abdominal muscles.  Control your return to the starting position. Repeat __________ times. Complete this exercise __________ times per day.  STRENGTHENING - Quadruped, Opposite UE/LE Lift  Assume a hands and knees position on a firm surface. Keep your hands under your shoulders and your knees under your hips. You may place padding under your knees for comfort.  Find your neutral spine and gently tense your abdominal muscles so that you can maintain this position. Your shoulders and hips should form a rectangle  that is parallel with the floor and is not twisted.  Keeping your trunk steady, lift your right hand no higher than your shoulder and then your left leg no higher than your hip. Make sure you are not holding your breath. Hold this position __________ seconds.  Continuing to keep your abdominal muscles tense and your back steady, slowly return to your starting position. Repeat with the opposite arm and leg. Repeat __________ times. Complete this exercise __________ times per day.  STRENGTHENING - Abdominals and Quadriceps, Straight Leg Raise   Lie on a firm bed or floor with both legs extended in front of you.  Keeping one leg in contact with the floor, bend the other knee so that your foot can rest flat on the floor.  Find your neutral spine, and tense your abdominal muscles to maintain your spinal position throughout the exercise.  Slowly lift your straight leg off the floor about 6 inches for a count of 15, making sure to not hold your breath.  Still keeping your neutral spine, slowly lower your leg all the way to the floor. Repeat this exercise with each leg __________ times. Complete this exercise __________ times per day. POSTURE AND BODY MECHANICS CONSIDERATIONS - Sciatica Keeping correct posture when sitting, standing or completing your activities will reduce the stress put on different body tissues, allowing injured tissues a chance to heal and limiting painful experiences. The following are general guidelines for improved posture. Your physician or physical therapist will provide you with any instructions specific to your needs. While reading these guidelines, remember:  The exercises prescribed by your provider will help you have the flexibility and strength to maintain correct postures.  The correct posture provides the optimal environment for your joints to work. All of your joints have less wear and tear when properly supported by a spine with good posture. This means you will  experience a healthier, less painful body.  Correct posture must be practiced with all of your activities, especially prolonged sitting and standing. Correct posture is as important when doing repetitive low-stress activities (typing) as it is when doing a single heavy-load activity (lifting). RESTING POSITIONS Consider which positions are most painful for you when choosing a resting position. If you have pain with flexion-based activities (sitting, bending, stooping, squatting), choose a position that allows you to rest in a less flexed posture. You would want to avoid curling into a fetal position on your side. If your pain worsens with extension-based activities (prolonged standing, working overhead), avoid resting in an extended position such as sleeping on your stomach. Most people will find more comfort when they rest with their spine in a more neutral position, neither too rounded nor too  arched. Lying on a non-sagging bed on your side with a pillow between your knees, or on your back with a pillow under your knees will often provide some relief. Keep in mind, being in any one position for a prolonged period of time, no matter how correct your posture, can still lead to stiffness. PROPER SITTING POSTURE In order to minimize stress and discomfort on your spine, you must sit with correct posture Sitting with good posture should be effortless for a healthy body. Returning to good posture is a gradual process. Many people can work toward this most comfortably by using various supports until they have the flexibility and strength to maintain this posture on their own. When sitting with proper posture, your ears will fall over your shoulders and your shoulders will fall over your hips. You should use the back of the chair to support your upper back. Your low back will be in a neutral position, just slightly arched. You may place a small pillow or folded towel at the base of your low back for support.  When  working at a desk, create an environment that supports good, upright posture. Without extra support, muscles fatigue and lead to excessive strain on joints and other tissues. Keep these recommendations in mind: CHAIR:   A chair should be able to slide under your desk when your back makes contact with the back of the chair. This allows you to work closely.  The chair's height should allow your eyes to be level with the upper part of your monitor and your hands to be slightly lower than your elbows. BODY POSITION  Your feet should make contact with the floor. If this is not possible, use a foot rest.  Keep your ears over your shoulders. This will reduce stress on your neck and low back. INCORRECT SITTING POSTURES   If you are feeling tired and unable to assume a healthy sitting posture, do not slouch or slump. This puts excessive strain on your back tissues, causing more damage and pain. Healthier options include:  Using more support, like a lumbar pillow.  Switching tasks to something that requires you to be upright or walking.  Talking a brief walk.  Lying down to rest in a neutral-spine position. PROLONGED STANDING WHILE SLIGHTLY LEANING FORWARD  When completing a task that requires you to lean forward while standing in one place for a long time, place either foot up on a stationary 2-4 inch high object to help maintain the best posture. When both feet are on the ground, the low back tends to lose its slight inward curve. If this curve flattens (or becomes too large), then the back and your other joints will experience too much stress, fatigue more quickly and can cause pain.  CORRECT STANDING POSTURES Proper standing posture should be assumed with all daily activities, even if they only take a few moments, like when brushing your teeth. As in sitting, your ears should fall over your shoulders and your shoulders should fall over your hips. You should keep a slight tension in your abdominal  muscles to brace your spine. Your tailbone should point down to the ground, not behind your body, resulting in an over-extended swayback posture.  INCORRECT STANDING POSTURES  Common incorrect standing postures include a forward head, locked knees and/or an excessive swayback. WALKING Walk with an upright posture. Your ears, shoulders and hips should all line-up. PROLONGED ACTIVITY IN A FLEXED POSITION When completing a task that requires you to bend forward  at your waist or lean over a low surface, try to find a way to stabilize 3 of 4 of your limbs. You can place a hand or elbow on your thigh or rest a knee on the surface you are reaching across. This will provide you more stability so that your muscles do not fatigue as quickly. By keeping your knees relaxed, or slightly bent, you will also reduce stress across your low back. CORRECT LIFTING TECHNIQUES DO :   Assume a wide stance. This will provide you more stability and the opportunity to get as close as possible to the object which you are lifting.  Tense your abdominals to brace your spine; then bend at the knees and hips. Keeping your back locked in a neutral-spine position, lift using your leg muscles. Lift with your legs, keeping your back straight.  Test the weight of unknown objects before attempting to lift them.  Try to keep your elbows locked down at your sides in order get the best strength from your shoulders when carrying an object.  Always ask for help when lifting heavy or awkward objects. INCORRECT LIFTING TECHNIQUES DO NOT:   Lock your knees when lifting, even if it is a small object.  Bend and twist. Pivot at your feet or move your feet when needing to change directions.  Assume that you cannot safely pick up a paperclip without proper posture.   This information is not intended to replace advice given to you by your health care provider. Make sure you discuss any questions you have with your health care provider.     Document Released: 03/30/2005 Document Revised: 08/14/2014 Document Reviewed: 07/12/2008 [employer] Interactive Patient Education 2016 [employer] Sciatica Sciatica is pain, weakness, numbness, or tingling along your sciatic nerve. The nerve starts in the lower back and runs down the back of each leg. Nerve damage or certain conditions pinch or put pressure on the sciatic nerve. This causes the pain, weakness, and other discomforts of sciatica. HOME CARE   Only take medicine as told by your doctor.  Apply ice to the affected area for 20 minutes. Do this 3-4 times a day for the first 48-72 hours. Then try heat in the same way.  Exercise, stretch, or do your usual activities if these do not make your pain worse.  Go to physical therapy as told by your doctor.  Keep all doctor visits as told.  Do not wear high heels or shoes that are not supportive.  Get a firm mattress if your mattress is too soft to lessen pain and discomfort. GET HELP RIGHT AWAY IF:   You cannot control when you poop (bowel movement) or pee (urinate).  You have more weakness in your lower back, lower belly (pelvis), butt (buttocks), or legs.  You have redness or puffiness (swelling) of your back.  You have a burning feeling when you pee.  You have pain that gets worse when you lie down.  You have pain that wakes you from your sleep.  Your pain is worse than past pain.  Your pain lasts longer than 4 weeks.  You are suddenly losing weight without reason. MAKE SURE YOU:   Understand these instructions.  Will watch this condition.  Will get help right away if you are not doing well or get worse.   This information is not intended to replace advice given to you by your health care provider. Make sure you discuss any questions you have with your health care provider.  Document Released: 01/07/2008 Document Revised: 12/19/2014 Document Reviewed: 08/09/2011 [employer] Interactive Patient Education 2016  [employer] Sciatica Sciatica is pain, weakness, numbness, or tingling along the path of the sciatic nerve. The nerve starts in the lower back and runs down the back of each leg. The nerve controls the muscles in the lower leg and in the back of the knee, while also providing sensation to the back of the thigh, lower leg, and the sole of your foot. Sciatica is a symptom of another medical condition. For instance, nerve damage or certain conditions, such as a herniated disk or bone spur on the spine, pinch or put pressure on the sciatic nerve. This causes the pain, weakness, or other sensations normally associated with sciatica. Generally, sciatica only affects one side of the body. CAUSES   Herniated or slipped disc.  Degenerative disk disease.  A pain disorder involving the narrow muscle in the buttocks (piriformis syndrome).  Pelvic injury or fracture.  Pregnancy.  Tumor (rare). SYMPTOMS  Symptoms can vary from mild to very severe. The symptoms usually travel from the low back to the buttocks and down the back of the leg. Symptoms can include:  Mild tingling or dull aches in the lower back, leg, or hip.  Numbness in the back of the calf or sole of the foot.  Burning sensations in the lower back, leg, or hip.  Sharp pains in the lower back, leg, or hip.  Leg weakness.  Severe back pain inhibiting movement. These symptoms may get worse with coughing, sneezing, laughing, or prolonged sitting or standing. Also, being overweight may worsen symptoms. DIAGNOSIS  Your caregiver will perform a physical exam to look for common symptoms of sciatica. He or she may ask you to do certain movements or activities that would trigger sciatic nerve pain. Other tests may be performed to find the cause of the sciatica. These may include:  Blood tests.  X-rays.  Imaging tests, such as an MRI or CT scan. TREATMENT  Treatment is directed at the cause of the sciatic pain. Sometimes, treatment is  not necessary and the pain and discomfort goes away on its own. If treatment is needed, your caregiver may suggest:  Over-the-counter medicines to relieve pain.  Prescription medicines, such as anti-inflammatory medicine, muscle relaxants, or narcotics.  Applying heat or ice to the painful area.  Steroid injections to lessen pain, irritation, and inflammation around the nerve.  Reducing activity during periods of pain.  Exercising and stretching to strengthen your abdomen and improve flexibility of your spine. Your caregiver may suggest losing weight if the extra weight makes the back pain worse.  Physical therapy.  Surgery to eliminate what is pressing or pinching the nerve, such as a bone spur or part of a herniated disk. HOME CARE INSTRUCTIONS   Only take over-the-counter or prescription medicines for pain or discomfort as directed by your caregiver.  Apply ice to the affected area for 20 minutes, 3-4 times a day for the first 48-72 hours. Then try heat in the same way.  Exercise, stretch, or perform your usual activities if these do not aggravate your pain.  Attend physical therapy sessions as directed by your caregiver.  Keep all follow-up appointments as directed by your caregiver.  Do not wear high heels or shoes that do not provide proper support.  Check your mattress to see if it is too soft. A firm mattress may lessen your pain and discomfort. SEEK IMMEDIATE MEDICAL CARE IF:   You lose control  of your bowel or bladder (incontinence).  You have increasing weakness in the lower back, pelvis, buttocks, or legs.  You have redness or swelling of your back.  You have a burning sensation when you urinate.  You have pain that gets worse when you lie down or awakens you at night.  Your pain is worse than you have experienced in the past.  Your pain is lasting longer than 4 weeks.  You are suddenly losing weight without reason. MAKE SURE YOU:  Understand these  instructions.  Will watch your condition.  Will get help right away if you are not doing well or get worse.   This information is not intended to replace advice given to you by your health care provider. Make sure you discuss any questions you have with your health care provider.   Document Released: 03/24/2001 Document Revised: 12/19/2014 Document Reviewed: 08/09/2011 Elsevier Interactive Patient Education [employer].

## 2015-03-15 ENCOUNTER — Ambulatory Visit (HOSPITAL_COMMUNITY)
Admission: RE | Admit: 2015-03-15 | Discharge: 2015-03-15 | Disposition: A | Discharge status: Home / Self Care | Payer: BLUE CROSS/BLUE SHIELD | Source: Ambulatory Visit | Attending: Family Medicine | Admitting: Family Medicine

## 2015-03-15 ENCOUNTER — Other Ambulatory Visit: Payer: Self-pay | Admitting: Family Medicine

## 2015-03-15 DIAGNOSIS — Z3A19 19 weeks gestation of pregnancy: Secondary | ICD-10-CM

## 2015-03-15 DIAGNOSIS — Z1389 Encounter for screening for other disorder: Secondary | ICD-10-CM

## 2015-03-15 DIAGNOSIS — Z3402 Encounter for supervision of normal first pregnancy, second trimester: Secondary | ICD-10-CM

## 2015-03-19 ENCOUNTER — Ambulatory Visit (INDEPENDENT_AMBULATORY_CARE_PROVIDER_SITE_OTHER): Payer: BLUE CROSS/BLUE SHIELD | Admitting: Obstetrics & Gynecology

## 2015-03-19 VITALS — BP 121/77 | HR 99 | Wt 167.0 lb

## 2015-03-19 DIAGNOSIS — Z36 Encounter for antenatal screening of mother: Secondary | ICD-10-CM | POA: Diagnosis not present

## 2015-03-19 DIAGNOSIS — Z3402 Encounter for supervision of normal first pregnancy, second trimester: Secondary | ICD-10-CM

## 2015-03-19 DIAGNOSIS — O418X20 Other specified disorders of amniotic fluid and membranes, second trimester, not applicable or unspecified: Secondary | ICD-10-CM

## 2015-03-19 NOTE — Patient Instructions (Signed)
Return to clinic for any obstetric concerns or go to [facility] for evaluation   Second Trimester of Pregnancy The second trimester is from week 13 through week 28, months 4 through 6. The second trimester is often a time when you feel your best. Your body has also adjusted to being pregnant, and you begin to feel better physically. Usually, morning sickness has lessened or quit completely, you may have more energy, and you may have an increase in appetite. The second trimester is also a time when the fetus is growing rapidly. At the end of the sixth month, the fetus is about 9 inches long and weighs about 1 pounds. You will likely begin to feel the baby move (quickening) between 18 and 20 weeks of the pregnancy. BODY CHANGES Your body goes through many changes during pregnancy. The changes vary from woman to woman.   Your weight will continue to increase. You will notice your lower abdomen bulging out.  You may begin to get stretch marks on your hips, abdomen, and breasts.  You may develop headaches that can be relieved by medicines approved by your health care provider.  You may urinate more often because the fetus is pressing on your bladder.  You may develop or continue to have heartburn as a result of your pregnancy.  You may develop constipation because certain hormones are causing the muscles that push waste through your intestines to slow down.  You may develop hemorrhoids or swollen, bulging veins (varicose veins).  You may have back pain because of the weight gain and pregnancy hormones relaxing your joints between the bones in your pelvis and as a result of a shift in weight and the muscles that support your balance.  Your breasts will continue to grow and be tender.  Your gums may bleed and may be sensitive to brushing and flossing.  Dark spots or blotches (chloasma, mask of pregnancy) may develop on your face. This will likely fade after the baby is born.  A dark line from your  belly button to the pubic area (linea nigra) may appear. This will likely fade after the baby is born.  You may have changes in your hair. These can include thickening of your hair, rapid growth, and changes in texture. Some women also have hair loss during or after pregnancy, or hair that feels dry or thin. Your hair will most likely return to normal after your baby is born. WHAT TO EXPECT AT YOUR PRENATAL VISITS During a routine prenatal visit:  You will be weighed to make sure you and the fetus are growing normally.  Your blood pressure will be taken.  Your abdomen will be measured to track your baby's growth.  The fetal heartbeat will be listened to.  Any test results from the previous visit will be discussed. Your health care provider may ask you:  How you are feeling.  If you are feeling the baby move.  If you have had any abnormal symptoms, such as leaking fluid, bleeding, severe headaches, or abdominal cramping.  If you are using any tobacco products, including cigarettes, chewing tobacco, and electronic cigarettes.  If you have any questions. Other tests that may be performed during your second trimester include:  Blood tests that check for:  Low iron levels (anemia).  Gestational diabetes (between 24 and 28 weeks).  Rh antibodies.  Urine tests to check for infections, diabetes, or protein in the urine.  An ultrasound to confirm the proper growth and development of the baby.  An amniocentesis to check for possible genetic problems.  Fetal screens for spina bifida and Down syndrome.  HIV (human immunodeficiency virus) testing. Routine prenatal testing includes screening for HIV, unless you choose not to have this test. HOME CARE INSTRUCTIONS   Avoid all smoking, herbs, alcohol, and unprescribed drugs. These chemicals affect the formation and growth of the baby.  Do not use any tobacco products, including cigarettes, chewing tobacco, and electronic cigarettes.  If you need help quitting, ask your health care provider. You may receive counseling support and other resources to help you quit.  Follow your health care provider's instructions regarding medicine use. There are medicines that are either safe or unsafe to take during pregnancy.  Exercise only as directed by your health care provider. Experiencing uterine cramps is a good sign to stop exercising.  Continue to eat regular, healthy meals.  Wear a good support bra for breast tenderness.  Do not use hot tubs, steam rooms, or saunas.  Wear your seat belt at all times when driving.  Avoid raw meat, uncooked cheese, cat litter boxes, and soil used by cats. These carry germs that can cause birth defects in the baby.  Take your prenatal vitamins.  Take 1500-2000 mg of calcium daily starting at the 20th week of pregnancy until you deliver your baby.  Try taking a stool softener (if your health care provider approves) if you develop constipation. Eat more high-fiber foods, such as fresh vegetables or fruit and whole grains. Drink plenty of fluids to keep your urine clear or pale yellow.  Take warm sitz baths to soothe any pain or discomfort caused by hemorrhoids. Use hemorrhoid cream if your health care provider approves.  If you develop varicose veins, wear support hose. Elevate your feet for 15 minutes, 3-4 times a day. Limit salt in your diet.  Avoid heavy lifting, wear low heel shoes, and practice good posture.  Rest with your legs elevated if you have leg cramps or low back pain.  Visit your dentist if you have not gone yet during your pregnancy. Use a soft toothbrush to brush your teeth and be gentle when you floss.  A sexual relationship may be continued unless your health care provider directs you otherwise.  Continue to go to all your prenatal visits as directed by your health care provider. SEEK MEDICAL CARE IF:   You have dizziness.  You have mild pelvic cramps, pelvic  pressure, or nagging pain in the abdominal area.  You have persistent nausea, vomiting, or diarrhea.  You have a bad smelling vaginal discharge.  You have pain with urination. SEEK IMMEDIATE MEDICAL CARE IF:   You have a fever.  You are leaking fluid from your vagina.  You have spotting or bleeding from your vagina.  You have severe abdominal cramping or pain.  You have rapid weight gain or loss.  You have shortness of breath with chest pain.  You notice sudden or extreme swelling of your face, hands, ankles, feet, or legs.  You have not felt your baby move in over an hour.  You have severe headaches that do not go away with medicine.  You have vision changes.   This information is not intended to replace advice given to you by your health care provider. Make sure you discuss any questions you have with your health care provider.   Document Released: 03/24/2001 Document Revised: 04/20/2014 Document Reviewed: 05/31/2012 Elsevier Interactive Patient Education [employer].

## 2015-03-19 NOTE — Progress Notes (Signed)
Subjective:  Sharon King is a 29 y.o. G1P0 at [redacted]w[redacted]d being seen today for ongoing prenatal care.  She is currently monitored for the following issues for this low-risk pregnancy and has ALLERGIC RHINITIS; ASTHMA; MIGRAINES, HX OF; Supervision of normal first pregnancy; Seizure disorder in pregnancy, antepartum ([place]); and Amniotic band diagnosed on [redacted]w[redacted]d ultrasound on her problem list.  She is on the [facility] program.  Patient reports no complaints.  Contractions: Not present. Vag. Bleeding: None.  Movement: Present. Denies leaking of fluid.   The following portions of the patient's history were reviewed and updated as appropriate: allergies, current medications, past family history, past medical history, past social history, past surgical history and problem list. Problem list updated.  Objective:   Filed Vitals:   03/19/15 0834  BP: 121/77  Pulse: 99  Weight: 167 lb (75.751 kg)    Fetal Status: Fetal Heart Rate (bpm): 140 Fundal Height: 20 cm Movement: Present     General:  Alert, oriented and cooperative. Patient is in no acute distress.  Skin: Skin is warm and dry. No rash noted.   Cardiovascular: Normal heart rate noted  Respiratory: Normal respiratory effort, no problems with respiration noted  Abdomen: Soft, gravid, appropriate for gestational age. Pain/Pressure: Absent     Pelvic: Vag. Bleeding: None Vag D/C Character: Thin   Cervical exam deferred        Extremities: Normal range of motion.  Edema: None  Mental Status: Normal mood and affect. Normal behavior. Normal judgment and thought content.   Urinalysis: Urine Protein: Negative Urine Glucose: Negative  Assessment and Plan:  Pregnancy: G1P0 at [redacted]w[redacted]d  1. Amniotic band in second trimester, not applicable or unspecified fetus [redacted]w[redacted]d: Does not encircle any fetal tissue Needs serial ultrasounds with MFM, about every 4 weeks  2. Encounter for supervision of normal first pregnancy in second trimester - HIV antibody -  AFP, Quad Screen Preterm labor symptoms and general obstetric precautions including but not limited to vaginal bleeding, contractions, leaking of fluid and fetal movement were reviewed in detail with the patient. Please refer to After Visit Summary for other counseling recommendations.  Return in about 8 weeks (around 05/14/2015) for 1 hr GTT, 3rd trimester labs, TDap, OB Visit.  Continue [facility] program for now.   Sharon Oman, MD

## 2015-03-20 LAB — AFP, QUAD SCREEN
AFP: 33.9 ng/mL
CURR GEST AGE: 19.6 wks.days
HCG TOTAL: 14.36 [IU]/mL
INH: 100 pg/mL
INTERPRETATION-AFP: NEGATIVE
MOM FOR HCG: 0.66
MOM FOR INH: 0.55
MoM for AFP: 0.62
Open Spina bifida: NEGATIVE
Osb Risk: <1:27300 {titer}
Tri 18 Scr Risk Est: NEGATIVE
Trisomy 18 (Edward) Syndrome Interp.: 1:20600 {titer}
UE3 MOM: 1.25
uE3 Value: 2.26 ng/mL

## 2015-03-20 LAB — HIV ANTIBODY (ROUTINE TESTING W REFLEX): HIV 1&2 Ab, 4th Generation: NONREACTIVE

## 2015-04-12 ENCOUNTER — Ambulatory Visit (HOSPITAL_COMMUNITY)
Admission: RE | Admit: 2015-04-12 | Discharge: 2015-04-12 | Disposition: A | Discharge status: Home / Self Care | Payer: BLUE CROSS/BLUE SHIELD | Source: Ambulatory Visit | Attending: Family Medicine | Admitting: Family Medicine

## 2015-04-12 VITALS — BP 128/74 | HR 115 | Wt 173.6 lb

## 2015-04-12 DIAGNOSIS — O418X20 Other specified disorders of amniotic fluid and membranes, second trimester, not applicable or unspecified: Secondary | ICD-10-CM | POA: Diagnosis present

## 2015-04-12 DIAGNOSIS — Z36 Encounter for antenatal screening of mother: Secondary | ICD-10-CM | POA: Insufficient documentation

## 2015-04-12 DIAGNOSIS — Z3A23 23 weeks gestation of pregnancy: Secondary | ICD-10-CM | POA: Diagnosis not present

## 2015-04-12 DIAGNOSIS — Z1389 Encounter for screening for other disorder: Secondary | ICD-10-CM

## 2015-04-29 ENCOUNTER — Telehealth: Payer: Self-pay | Admitting: *Deleted

## 2015-04-29 DIAGNOSIS — R51 Headache: Principal | ICD-10-CM

## 2015-04-29 DIAGNOSIS — R519 Headache, unspecified: Secondary | ICD-10-CM

## 2015-04-29 MED ORDER — BUTALBITAL-APAP-CAFFEINE 50-325-40 MG PO CAPS: 1.0000 | ORAL_CAPSULE | Freq: Four times a day (QID) | ORAL | Status: DC | PRN

## 2015-04-29 NOTE — Telephone Encounter (Signed)
Pt has a history of migraine headaches, stated she began to have a headache 2 days ago and has tried Tylenol with minimal relief, wanting to know if she can take anything else.  Spoke to Dr Elly Modena, sent Fioricet to pharmacy and encouraged increase in fluid intake.  Scheduled appointment with Santiago Glad on 04-30-15 at 1045 to follow-up.

## 2015-04-30 ENCOUNTER — Encounter: Payer: Self-pay | Admitting: Physician Assistant

## 2015-04-30 ENCOUNTER — Ambulatory Visit (INDEPENDENT_AMBULATORY_CARE_PROVIDER_SITE_OTHER): Payer: 59 | Admitting: Physician Assistant

## 2015-04-30 VITALS — BP 128/81 | HR 109 | Resp 18 | Ht 63.0 in | Wt 181.0 lb

## 2015-04-30 DIAGNOSIS — O219 Vomiting of pregnancy, unspecified: Secondary | ICD-10-CM

## 2015-04-30 DIAGNOSIS — G43001 Migraine without aura, not intractable, with status migrainosus: Secondary | ICD-10-CM

## 2015-04-30 DIAGNOSIS — G43809 Other migraine, not intractable, without status migrainosus: Secondary | ICD-10-CM

## 2015-04-30 MED ORDER — PROMETHAZINE HCL 25 MG/ML IJ SOLN: 25.0000 mg | Freq: Once | INTRAMUSCULAR | Status: DC

## 2015-04-30 MED ORDER — CYCLOBENZAPRINE HCL 10 MG PO TABS: 10.0000 mg | ORAL_TABLET | Freq: Three times a day (TID) | ORAL | Status: DC | PRN

## 2015-04-30 MED ORDER — OXYCODONE-ACETAMINOPHEN 5-325 MG PO TABS: 1.0000 | ORAL_TABLET | ORAL | Status: DC | PRN

## 2015-04-30 MED ORDER — PROMETHAZINE HCL 25 MG/ML IJ SOLN
25.0000 mg | Freq: Four times a day (QID) | INTRAMUSCULAR | Status: AC | PRN
  Administered 2015-04-30: 25 mg via INTRAMUSCULAR

## 2015-04-30 NOTE — Progress Notes (Signed)
Pt started having a migraine 3 days ago that is progressively getting worse to the point where she cant turn her head side to side.  Has taken Tylenol and tried Fioricet which was called in to the pharmacy yesterday.

## 2015-04-30 NOTE — Progress Notes (Signed)
Patient ID: Sharon King, female   DOB: 1985/11/08, 30 y.o.   MRN: [id number] History:  Sharon King is a 30 y.o. G1P0 who presents to clinic today for treatment of 3 day HA.  Has tried Tylenol, phenergan and Fioricet for this headache.  She used 3 tabs of fioricet and 25mg  pf phenergan with no relief whatsoever.  She has an extensive migraine history and has tried many medications for acute treatment as well as prevention.    Several options discussed including trigger point injections and Prednisone taper.  She reports bad reactions to these as well as many other medications.   She was on Topamax and Neurontin prior to pregnancy.  Sees neurologist for management when not pregnant.    HIT6:65 Number of days in the last 4 weeks with:  Severe headache: 2 Moderate headache: 1 Mild headache: 2  No headache: 25   Past Medical History  Diagnosis Date  . Migraine   . Migraines   . Seizures ([place])     Last seizure 2014    Social History   Social History  . Marital Status: Single    Spouse Name: N/A  . Number of Children: N/A  . Years of Education: N/A   Occupational History  . Not on file.   Social History Main Topics  . Smoking status: Former Research scientist (life sciences)  . Smokeless tobacco: Former Systems developer  . Alcohol Use: Yes     Comment: rare-stopped with pregnancy  . Drug Use: No  . Sexual Activity: Yes    Birth Control/ Protection: None   Other Topics Concern  . Not on file   Social History Narrative    Family History  Problem Relation Age of Onset  . Hyperlipidemia Mother   . Arthritis Mother   . Depression Mother     Allergies  Allergen Reactions  . Paroxetine Other (See Comments)    Heart palpatations  . Erythromycin Rash  . Sulfonamide Derivatives Rash    Current Outpatient Prescriptions on File Prior to Visit  Medication Sig Dispense Refill  . Butalbital-APAP-Caffeine 50-325-40 MG capsule Take 1-2 capsules by mouth every 6 (six) hours as needed for headache. 30  capsule 1  . diphenhydrAMINE (BENADRYL) 25 MG tablet Take 50 mg by mouth 2 (two) times daily as needed for allergies or sleep.     . Melatonin 3 MG CAPS Take 3 mg by mouth at bedtime as needed (for sleep).     . promethazine (PHENERGAN) 12.5 MG tablet TAKE 1 TABLET BY MOUTH EVERY 6 HOURS AS NEEDED FOR NAUSEA/VOMITING 30 tablet 1   No current facility-administered medications on file prior to visit.     Review of Systems:  All pertinent positive/negative included in HPI, all other review of systems are negative  Objective:  Physical Exam BP 128/81 mmHg  Pulse 109  Resp 18  Ht 5\' 3"  (1.6 m)  Wt 181 lb (82.101 kg)  BMI 32.07 kg/m2  LMP 10/09/2014 CONSTITUTIONAL: Well-developed, well-nourished female in no acute distress but appears uncomfortable  EYES: EOM intact ENT: Normocephalic CARDIOVASCULAR: Regular rate and rhythm with no adventitious sounds.  RESPIRATORY: Normal rate. Clear to auscultation bilaterally.  ENDOCRINE: Normal thyroid.  MUSCULOSKELETAL: Normal ROM SKIN: Warm, dry without erythema  NEUROLOGICAL: Alert, oriented, CN II-XII grossly intact, Appropriate balance PSYCH: Normal behavior, mood   Assessment & Plan:  Assessment: Intractable migraine  Plan: IM phenergan given in office today.   Limited supply of percocet given for at home use.  Will not  maintain regular rx for this.   Flexeril first line for HA and/or muscle spasm Follow-up in 1-3 weeks for full discussion of HA process when feeling better.    Paticia Stack, PA-C 04/30/2015 11:10 AM

## 2015-05-07 NOTE — Patient Instructions (Signed)

## 2015-05-08 ENCOUNTER — Other Ambulatory Visit: Payer: Self-pay | Admitting: Family Medicine

## 2015-05-10 ENCOUNTER — Ambulatory Visit (HOSPITAL_COMMUNITY)
Admission: RE | Admit: 2015-05-10 | Discharge: 2015-05-10 | Disposition: A | Discharge status: Home / Self Care | Payer: 59 | Source: Ambulatory Visit | Attending: Family Medicine | Admitting: Family Medicine

## 2015-05-10 ENCOUNTER — Encounter (HOSPITAL_COMMUNITY): Payer: Self-pay

## 2015-05-10 ENCOUNTER — Other Ambulatory Visit (HOSPITAL_COMMUNITY): Payer: Self-pay | Admitting: Maternal and Fetal Medicine

## 2015-05-10 DIAGNOSIS — O418X20 Other specified disorders of amniotic fluid and membranes, second trimester, not applicable or unspecified: Secondary | ICD-10-CM

## 2015-05-10 DIAGNOSIS — O418X90 Other specified disorders of amniotic fluid and membranes, unspecified trimester, not applicable or unspecified: Secondary | ICD-10-CM | POA: Insufficient documentation

## 2015-05-10 DIAGNOSIS — Z3A27 27 weeks gestation of pregnancy: Secondary | ICD-10-CM

## 2015-05-14 ENCOUNTER — Encounter: Payer: BLUE CROSS/BLUE SHIELD | Admitting: Family Medicine

## 2015-05-14 ENCOUNTER — Encounter: Payer: 59 | Admitting: Physician Assistant

## 2015-05-15 ENCOUNTER — Ambulatory Visit (INDEPENDENT_AMBULATORY_CARE_PROVIDER_SITE_OTHER): Payer: 59 | Admitting: Certified Nurse Midwife

## 2015-05-15 ENCOUNTER — Encounter: Payer: Self-pay | Admitting: Certified Nurse Midwife

## 2015-05-15 VITALS — BP 115/79 | HR 94 | Wt 187.0 lb

## 2015-05-15 DIAGNOSIS — Z23 Encounter for immunization: Secondary | ICD-10-CM | POA: Diagnosis not present

## 2015-05-15 DIAGNOSIS — Z3493 Encounter for supervision of normal pregnancy, unspecified, third trimester: Secondary | ICD-10-CM

## 2015-05-15 DIAGNOSIS — Z3403 Encounter for supervision of normal first pregnancy, third trimester: Secondary | ICD-10-CM

## 2015-05-15 DIAGNOSIS — Z36 Encounter for antenatal screening of mother: Secondary | ICD-10-CM

## 2015-05-15 LAB — CBC
HCT: 30.6 % — ABNORMAL LOW (ref 36.0–46.0)
Hemoglobin: 10 g/dL — ABNORMAL LOW (ref 12.0–15.0)
MCH: 27.2 pg (ref 26.0–34.0)
MCHC: 32.7 g/dL (ref 30.0–36.0)
MCV: 83.4 fL (ref 78.0–100.0)
MPV: 8.9 fL (ref 8.6–12.4)
Platelets: 422 10*3/uL — ABNORMAL HIGH (ref 150–400)
RBC: 3.67 MIL/uL — ABNORMAL LOW (ref 3.87–5.11)
RDW: 14.7 % (ref 11.5–15.5)
WBC: 14 10*3/uL — ABNORMAL HIGH (ref 4.0–10.5)

## 2015-05-15 NOTE — Progress Notes (Signed)
Subjective:  Sharon King is a 30 y.o. G1P0 at [redacted]w[redacted]d being seen today for ongoing prenatal care.  She is currently monitored for the following issues for this low-risk pregnancy and has ALLERGIC RHINITIS; ASTHMA; MIGRAINES, HX OF; Supervision of normal first pregnancy; Seizure disorder in pregnancy, antepartum ([place]); and Amniotic band diagnosed on [redacted]w[redacted]d ultrasound on her problem list.  Patient reports insomnia.  Contractions: Not present. Vag. Bleeding: None.  Movement: Present. Denies leaking of fluid.   The following portions of the patient's history were reviewed and updated as appropriate: allergies, current medications, past family history, past medical history, past social history, past surgical history and problem list. Problem list updated.  Objective:   Filed Vitals:   05/15/15 1459  BP: 115/79  Pulse: 94  Weight: 187 lb (84.823 kg)    Fetal Status: Fetal Heart Rate (bpm): 141 Fundal Height: 28 cm Movement: Present     General:  Alert, oriented and cooperative. Patient is in no acute distress.  Skin: Skin is warm and dry. No rash noted.   Cardiovascular: Normal heart rate noted  Respiratory: Normal respiratory effort, no problems with respiration noted  Abdomen: Soft, gravid, appropriate for gestational age. Pain/Pressure: Absent     Pelvic: Vag. Bleeding: None Vag D/C Character: Thin   Cervical exam deferred        Extremities: Normal range of motion.  Edema: Trace  Mental Status: Normal mood and affect. Normal behavior. Normal judgment and thought content.   Urinalysis:      Assessment and Plan:  Pregnancy: G1P0 at [redacted]w[redacted]d  1. Supervision of normal pregnancy in third trimester  - Tdap vaccine greater than or equal to 7yo IM - CBC - RPR - HIV antibody - Glucose Tolerance, 1 HR (50g)  2. Encounter for supervision of normal first pregnancy in third trimester   Preterm labor symptoms and general obstetric precautions including but not limited to vaginal  bleeding, contractions, leaking of fluid and fetal movement were reviewed in detail with the patient. Please refer to After Visit Summary for other counseling recommendations.  Return in about 2 weeks (around 05/29/2015).   Larey Days, CNM

## 2015-05-15 NOTE — Patient Instructions (Signed)
Glucose Tolerance Test The glucose tolerance test (GTT) is one of several tests used to diagnose diabetes mellitus. The GTT is a blood test, and it may include a urine test as well. The GTT checks to see how your body processes sugar (glucose). For this test, you will consume a drink containing a high level of glucose. Your blood glucose levels will be checked before you consume the drink and then again 1, 2, 3, and possibly 4 hours after you consume it. Your health care provider may recommend that you have the GTT if you:  Have a family history of diabetes.   Are very overweight (obese).   Have experienced infections that keep coming back.   Have had numerous cuts or wounds that did not heal quickly, especially on your legs and feet.   Are a woman and have a history of giving birth to very large babies or a history of repeated fetal loss (stillbirth).  Have had glucose in your urine or high blood sugar:   During pregnancy.   After a heart attack, surgery, or prolonged periods of high stress.  The GTT lasts 3-4 hours. Other than the glucose solution, you will not be allowed to eat or drink anything during the test. You must remain at the testing location to make sure that your blood and urine samples are taken on time. PREPARATION FOR TEST Eat normally for 3 days prior to the GTT test, including having plenty of carbohydrate-rich foods. Do not eat or drink anything except water during the final 12 hours before the test. You should not smoke or exercise during the test. In addition, your health care provider may ask you to stop taking certain medicines before the test. RESULTS It is your responsibility to obtain your test results. Ask the lab or department performing the test when and how you will get your results. Contact your health care provider to discuss any questions you have about your results. Range of Normal Values Ranges for normal values may vary among different labs and  hospitals. You should always check with your health care provider after having lab work or other tests done to discuss whether your values are considered within normal limits.  Normal levels of blood glucose are as follows:  Fasting: less than 110 mg/dL or less than 6.1 mmol/L (SI units).  1 hour after consuming the glucose drink: less than 200 mg/dL or less than 11.1 mmol/L.  2 hours after consuming the glucose drink: less than 140 mg/dL or less than 7.8 mmol/L.  3 hours after consuming the glucose drink: 70-115 mg/dL or less than 6.4 mmol/L.  4 hours after consuming the glucose drink: 70-115 mg/dL or less than 6.4 mmol/L. The normal result for the urine test is negative, meaning that glucose is absent from your urine. Some substances can interfere with GTT results. These may include:  Blood pressure and heart failure medicines, including beta blockers, furosemide, and thiazides.   Anti-inflammatory medicines, including aspirin.   Nicotine.   Some psychiatric medicines.   Oral contraceptives.   Diuretics or corticosteroids. Meaning of Results Outside Normal Value Ranges GTT test results that are above normal values may indicate health problems, such as:  Diabetes mellitus.   Acute stress response.   Cushing syndrome.   Tumors such as pheochromocytoma or glucagonoma.   Chronic renal failure.   Pancreatitis.   Hyperthyroidism.   Current infection.  Discuss your test results with your health care provider. He or she will use the results   to make a diagnosis and determine a treatment plan that is right for you.   This information is not intended to replace advice given to you by your health care provider. Make sure you discuss any questions you have with your health care provider.   Document Released: 04/22/2004 Document Revised: 04/20/2014 Document Reviewed: 08/04/2013 Elsevier Interactive Patient Education 2016 [employer]  

## 2015-05-15 NOTE — Progress Notes (Signed)
Pt using Benadryl and Melatonin for sleep, requesting any other options or medications that she could use to help her sleep.

## 2015-05-16 LAB — GLUCOSE TOLERANCE, 1 HOUR (50G) W/O FASTING: Glucose, 1 Hour GTT: 124 mg/dL (ref 70–140)

## 2015-05-16 LAB — HIV ANTIBODY (ROUTINE TESTING W REFLEX): HIV 1&2 Ab, 4th Generation: NONREACTIVE

## 2015-05-16 LAB — RPR

## 2015-05-27 ENCOUNTER — Other Ambulatory Visit: Payer: Self-pay | Admitting: Family Medicine

## 2015-05-28 ENCOUNTER — Encounter: Payer: 59 | Admitting: Physician Assistant

## 2015-05-31 ENCOUNTER — Ambulatory Visit (INDEPENDENT_AMBULATORY_CARE_PROVIDER_SITE_OTHER): Payer: 59 | Admitting: Family Medicine

## 2015-05-31 VITALS — BP 127/83 | HR 112 | Wt 189.0 lb

## 2015-05-31 DIAGNOSIS — O99352 Diseases of the nervous system complicating pregnancy, second trimester: Secondary | ICD-10-CM

## 2015-05-31 DIAGNOSIS — O418X20 Other specified disorders of amniotic fluid and membranes, second trimester, not applicable or unspecified: Secondary | ICD-10-CM

## 2015-05-31 DIAGNOSIS — Z3403 Encounter for supervision of normal first pregnancy, third trimester: Secondary | ICD-10-CM

## 2015-05-31 DIAGNOSIS — G40909 Epilepsy, unspecified, not intractable, without status epilepticus: Secondary | ICD-10-CM

## 2015-05-31 NOTE — Patient Instructions (Signed)
Contraception Choices Contraception (birth control) is the use of any methods or devices to prevent pregnancy. Below are some methods to help avoid pregnancy. HORMONAL METHODS   Contraceptive implant. This is a thin, plastic tube containing progesterone hormone. It does not contain estrogen hormone. Your health care provider inserts the tube in the inner part of the upper arm. The tube can remain in place for up to 3 years. After 3 years, the implant must be removed. The implant prevents the ovaries from releasing an egg (ovulation), thickens the cervical mucus to prevent sperm from entering the uterus, and thins the lining of the inside of the uterus.  Progesterone-only injections. These injections are given every 3 months by your health care provider to prevent pregnancy. This synthetic progesterone hormone stops the ovaries from releasing eggs. It also thickens cervical mucus and changes the uterine lining. This makes it harder for sperm to survive in the uterus.  Birth control pills. These pills contain estrogen and progesterone hormone. They work by preventing the ovaries from releasing eggs (ovulation). They also cause the cervical mucus to thicken, preventing the sperm from entering the uterus. Birth control pills are prescribed by a health care provider.Birth control pills can also be used to treat heavy periods.  Minipill. This type of birth control pill contains only the progesterone hormone. They are taken every day of each month and must be prescribed by your health care provider.  Birth control patch. The patch contains hormones similar to those in birth control pills. It must be changed once a week and is prescribed by a health care provider.  Vaginal ring. The ring contains hormones similar to those in birth control pills. It is left in the vagina for 3 weeks, removed for 1 week, and then a new one is put back in place. The patient must be comfortable inserting and removing the ring  from the vagina.A health care provider's prescription is necessary.  Emergency contraception. Emergency contraceptives prevent pregnancy after unprotected sexual intercourse. This pill can be taken right after sex or up to 5 days after unprotected sex. It is most effective the sooner you take the pills after having sexual intercourse. Most emergency contraceptive pills are available without a prescription. Check with your pharmacist. Do not use emergency contraception as your only form of birth control. BARRIER METHODS   Female condom. This is a thin sheath (latex or rubber) that is worn over the penis during sexual intercourse. It can be used with spermicide to increase effectiveness.  Female condom. This is a soft, loose-fitting sheath that is put into the vagina before sexual intercourse.  Diaphragm. This is a soft, latex, dome-shaped barrier that must be fitted by a health care provider. It is inserted into the vagina, along with a spermicidal jelly. It is inserted before intercourse. The diaphragm should be left in the vagina for 6 to 8 hours after intercourse.  Cervical cap. This is a round, soft, latex or plastic cup that fits over the cervix and must be fitted by a health care provider. The cap can be left in place for up to 48 hours after intercourse.  Sponge. This is a soft, circular piece of polyurethane foam. The sponge has spermicide in it. It is inserted into the vagina after wetting it and before sexual intercourse.  Spermicides. These are chemicals that kill or block sperm from entering the cervix and uterus. They come in the form of creams, jellies, suppositories, foam, or tablets. They do not require a   prescription. They are inserted into the vagina with an applicator before having sexual intercourse. The process must be repeated every time you have sexual intercourse. INTRAUTERINE CONTRACEPTION  Intrauterine device (IUD). This is a T-shaped device that is put in a woman's uterus  during a menstrual period to prevent pregnancy. There are 2 types:  Copper IUD. This type of IUD is wrapped in copper wire and is placed inside the uterus. Copper makes the uterus and fallopian tubes produce a fluid that kills sperm. It can stay in place for 10 years.  Hormone IUD. This type of IUD contains the hormone progestin (synthetic progesterone). The hormone thickens the cervical mucus and prevents sperm from entering the uterus, and it also thins the uterine lining to prevent implantation of a fertilized egg. The hormone can weaken or kill the sperm that get into the uterus. It can stay in place for 3-5 years, depending on which type of IUD is used. PERMANENT METHODS OF CONTRACEPTION  Female tubal ligation. This is when the woman's fallopian tubes are surgically sealed, tied, or blocked to prevent the egg from traveling to the uterus.  Hysteroscopic sterilization. This involves placing a small coil or insert into each fallopian tube. Your doctor uses a technique called hysteroscopy to do the procedure. The device causes scar tissue to form. This results in permanent blockage of the fallopian tubes, so the sperm cannot fertilize the egg. It takes about 3 months after the procedure for the tubes to become blocked. You must use another form of birth control for these 3 months.  Female sterilization. This is when the female has the tubes that carry sperm tied off (vasectomy).This blocks sperm from entering the vagina during sexual intercourse. After the procedure, the man can still ejaculate fluid (semen). NATURAL PLANNING METHODS  Natural family planning. This is not having sexual intercourse or using a barrier method (condom, diaphragm, cervical cap) on days the woman could become pregnant.  Calendar method. This is keeping track of the length of each menstrual cycle and identifying when you are fertile.  Ovulation method. This is avoiding sexual intercourse during ovulation.  Symptothermal  method. This is avoiding sexual intercourse during ovulation, using a thermometer and ovulation symptoms.  Post-ovulation method. This is timing sexual intercourse after you have ovulated. Regardless of which type or method of contraception you choose, it is important that you use condoms to protect against the transmission of sexually transmitted infections (STIs). Talk with your health care provider about which form of contraception is most appropriate for you.   This information is not intended to replace advice given to you by your health care provider. Make sure you discuss any questions you have with your health care provider.   Document Released: 03/30/2005 Document Revised: 04/04/2013 Document Reviewed: 09/22/2012 Elsevier Interactive Patient Education 2016 [employer]  Breastfeeding Deciding to breastfeed is one of the best choices you can make for you and your baby. A change in hormones during pregnancy causes your breast tissue to grow and increases the number and size of your milk ducts. These hormones also allow proteins, sugars, and fats from your blood supply to make breast milk in your milk-producing glands. Hormones prevent breast milk from being released before your baby is born as well as prompt milk flow after birth. Once breastfeeding has begun, thoughts of your baby, as well as his or her sucking or crying, can stimulate the release of milk from your milk-producing glands.  BENEFITS OF BREASTFEEDING For Your Baby    Your first milk (colostrum) helps your baby's digestive system function better.  There are antibodies in your milk that help your baby fight off infections.  Your baby has a lower incidence of asthma, allergies, and sudden infant death syndrome.  The nutrients in breast milk are better for your baby than infant formulas and are designed uniquely for your baby's needs.  Breast milk improves your baby's brain development.  Your baby is less likely to develop  other conditions, such as childhood obesity, asthma, or type 2 diabetes mellitus. For You  Breastfeeding helps to create a very special bond between you and your baby.  Breastfeeding is convenient. Breast milk is always available at the correct temperature and costs nothing.  Breastfeeding helps to burn calories and helps you lose the weight gained during pregnancy.  Breastfeeding makes your uterus contract to its prepregnancy size faster and slows bleeding (lochia) after you give birth.   Breastfeeding helps to lower your risk of developing type 2 diabetes mellitus, osteoporosis, and breast or ovarian cancer later in life. SIGNS THAT YOUR BABY IS HUNGRY Early Signs of Hunger  Increased alertness or activity.  Stretching.  Movement of the head from side to side.  Movement of the head and opening of the mouth when the corner of the mouth or cheek is stroked (rooting).  Increased sucking sounds, smacking lips, cooing, sighing, or squeaking.  Hand-to-mouth movements.  Increased sucking of fingers or hands. Late Signs of Hunger  Fussing.  Intermittent crying. Extreme Signs of Hunger Signs of extreme hunger will require calming and consoling before your baby will be able to breastfeed successfully. Do not wait for the following signs of extreme hunger to occur before you initiate breastfeeding:  Restlessness.  A loud, strong cry.  Screaming. BREASTFEEDING BASICS Breastfeeding Initiation  Find a comfortable place to sit or lie down, with your neck and back well supported.  Place a pillow or rolled up blanket under your baby to bring him or her to the level of your breast (if you are seated). Nursing pillows are specially designed to help support your arms and your baby while you breastfeed.  Make sure that your baby's abdomen is facing your abdomen.  Gently massage your breast. With your fingertips, massage from your chest wall toward your nipple in a circular motion.  This encourages milk flow. You may need to continue this action during the feeding if your milk flows slowly.  Support your breast with 4 fingers underneath and your thumb above your nipple. Make sure your fingers are well away from your nipple and your baby's mouth.  Stroke your baby's lips gently with your finger or nipple.  When your baby's mouth is open wide enough, quickly bring your baby to your breast, placing your entire nipple and as much of the colored area around your nipple (areola) as possible into your baby's mouth.  More areola should be visible above your baby's upper lip than below the lower lip.  Your baby's tongue should be between his or her lower gum and your breast.  Ensure that your baby's mouth is correctly positioned around your nipple (latched). Your baby's lips should create a seal on your breast and be turned out (everted).  It is common for your baby to suck about 2-3 minutes in order to start the flow of breast milk. Latching Teaching your baby how to latch on to your breast properly is very important. An improper latch can cause nipple pain and decreased milk supply for   you and poor weight gain in your baby. Also, if your baby is not latched onto your nipple properly, he or she may swallow some air during feeding. This can make your baby fussy. Burping your baby when you switch breasts during the feeding can help to get rid of the air. However, teaching your baby to latch on properly is still the best way to prevent fussiness from swallowing air while breastfeeding. Signs that your baby has successfully latched on to your nipple:  Silent tugging or silent sucking, without causing you pain.  Swallowing heard between every 3-4 sucks.  Muscle movement above and in front of his or her ears while sucking. Signs that your baby has not successfully latched on to nipple:  Sucking sounds or smacking sounds from your baby while breastfeeding.  Nipple pain. If you  think your baby has not latched on correctly, slip your finger into the corner of your baby's mouth to break the suction and place it between your baby's gums. Attempt breastfeeding initiation again. Signs of Successful Breastfeeding Signs from your baby:  A gradual decrease in the number of sucks or complete cessation of sucking.  Falling asleep.  Relaxation of his or her body.  Retention of a small amount of milk in his or her mouth.  Letting go of your breast by himself or herself. Signs from you:  Breasts that have increased in firmness, weight, and size 1-3 hours after feeding.  Breasts that are softer immediately after breastfeeding.  Increased milk volume, as well as a change in milk consistency and color by the fifth day of breastfeeding.  Nipples that are not sore, cracked, or bleeding. Signs That Your Baby is Getting Enough Milk  Wetting at least 3 diapers in a 24-hour period. The urine should be clear and pale yellow by age 5 days.  At least 3 stools in a 24-hour period by age 5 days. The stool should be soft and yellow.  At least 3 stools in a 24-hour period by age 7 days. The stool should be seedy and yellow.  No loss of weight greater than 10% of birth weight during the first 3 days of age.  Average weight gain of 4-7 ounces (113-198 g) per week after age 4 days.  Consistent daily weight gain by age 5 days, without weight loss after the age of 2 weeks. After a feeding, your baby may spit up a small amount. This is common. BREASTFEEDING FREQUENCY AND DURATION Frequent feeding will help you make more milk and can prevent sore nipples and breast engorgement. Breastfeed when you feel the need to reduce the fullness of your breasts or when your baby shows signs of hunger. This is called "breastfeeding on demand." Avoid introducing a pacifier to your baby while you are working to establish breastfeeding (the first 4-6 weeks after your baby is born). After this time you  may choose to use a pacifier. Research has shown that pacifier use during the first year of a baby's life decreases the risk of sudden infant death syndrome (SIDS). Allow your baby to feed on each breast as long as he or she wants. Breastfeed until your baby is finished feeding. When your baby unlatches or falls asleep while feeding from the first breast, offer the second breast. Because newborns are often sleepy in the first few weeks of life, you may need to awaken your baby to get him or her to feed. Breastfeeding times will vary from baby to baby. However, the following   rules can serve as a guide to help you ensure that your baby is properly fed:  Newborns (babies 4 weeks of age or younger) may breastfeed every 1-3 hours.  Newborns should not go longer than 3 hours during the day or 5 hours during the night without breastfeeding.  You should breastfeed your baby a minimum of 8 times in a 24-hour period until you begin to introduce solid foods to your baby at around 6 months of age. BREAST MILK PUMPING Pumping and storing breast milk allows you to ensure that your baby is exclusively fed your breast milk, even at times when you are unable to breastfeed. This is especially important if you are going back to work while you are still breastfeeding or when you are not able to be present during feedings. Your lactation consultant can give you guidelines on how long it is safe to store breast milk. A breast pump is a machine that allows you to pump milk from your breast into a sterile bottle. The pumped breast milk can then be stored in a refrigerator or freezer. Some breast pumps are operated by hand, while others use electricity. Ask your lactation consultant which type will work best for you. Breast pumps can be purchased, but some hospitals and breastfeeding support groups lease breast pumps on a monthly basis. A lactation consultant can teach you how to hand express breast milk, if you prefer not to use  a pump. CARING FOR YOUR BREASTS WHILE YOU BREASTFEED Nipples can become dry, cracked, and sore while breastfeeding. The following recommendations can help keep your breasts moisturized and healthy:  Avoid using soap on your nipples.  Wear a supportive bra. Although not required, special nursing bras and tank tops are designed to allow access to your breasts for breastfeeding without taking off your entire bra or top. Avoid wearing underwire-style bras or extremely tight bras.  Air dry your nipples for 3-4minutes after each feeding.  Use only cotton bra pads to absorb leaked breast milk. Leaking of breast milk between feedings is normal.  Use lanolin on your nipples after breastfeeding. Lanolin helps to maintain your skin's normal moisture barrier. If you use pure lanolin, you do not need to wash it off before feeding your baby again. Pure lanolin is not toxic to your baby. You may also hand express a few drops of breast milk and gently massage that milk into your nipples and allow the milk to air dry. In the first few weeks after giving birth, some women experience extremely full breasts (engorgement). Engorgement can make your breasts feel heavy, warm, and tender to the touch. Engorgement peaks within 3-5 days after you give birth. The following recommendations can help ease engorgement:  Completely empty your breasts while breastfeeding or pumping. You may want to start by applying warm, moist heat (in the shower or with warm water-soaked hand towels) just before feeding or pumping. This increases circulation and helps the milk flow. If your baby does not completely empty your breasts while breastfeeding, pump any extra milk after he or she is finished.  Wear a snug bra (nursing or regular) or tank top for 1-2 days to signal your body to slightly decrease milk production.  Apply ice packs to your breasts, unless this is too uncomfortable for you.  Make sure that your baby is latched on and  positioned properly while breastfeeding. If engorgement persists after 48 hours of following these recommendations, contact your health care provider or a lactation consultant. OVERALL HEALTH   CARE RECOMMENDATIONS WHILE BREASTFEEDING  Eat healthy foods. Alternate between meals and snacks, eating 3 of each per day. Because what you eat affects your breast milk, some of the foods may make your baby more irritable than usual. Avoid eating these foods if you are sure that they are negatively affecting your baby.  Drink milk, fruit juice, and water to satisfy your thirst (about 10 glasses a day).  Rest often, relax, and continue to take your prenatal vitamins to prevent fatigue, stress, and anemia.  Continue breast self-awareness checks.  Avoid chewing and smoking tobacco. Chemicals from cigarettes that pass into breast milk and exposure to secondhand smoke may harm your baby.  Avoid alcohol and drug use, including marijuana. Some medicines that may be harmful to your baby can pass through breast milk. It is important to ask your health care provider before taking any medicine, including all over-the-counter and prescription medicine as well as vitamin and herbal supplements. It is possible to become pregnant while breastfeeding. If birth control is desired, ask your health care provider about options that will be safe for your baby. SEEK MEDICAL CARE IF:  You feel like you want to stop breastfeeding or have become frustrated with breastfeeding.  You have painful breasts or nipples.  Your nipples are cracked or bleeding.  Your breasts are red, tender, or warm.  You have a swollen area on either breast.  You have a fever or chills.  You have nausea or vomiting.  You have drainage other than breast milk from your nipples.  Your breasts do not become full before feedings by the fifth day after you give birth.  You feel sad and depressed.  Your baby is too sleepy to eat well.  Your  baby is having trouble sleeping.   Your baby is wetting less than 3 diapers in a 24-hour period.  Your baby has less than 3 stools in a 24-hour period.  Your baby's skin or the white part of his or her eyes becomes yellow.   Your baby is not gaining weight by 5 days of age. SEEK IMMEDIATE MEDICAL CARE IF:  Your baby is overly tired (lethargic) and does not want to wake up and feed.  Your baby develops an unexplained fever.   This information is not intended to replace advice given to you by your health care provider. Make sure you discuss any questions you have with your health care provider.   Document Released: 03/30/2005 Document Revised: 12/19/2014 Document Reviewed: 09/21/2012 Elsevier Interactive Patient Education 2016 [employer]  

## 2015-05-31 NOTE — Progress Notes (Signed)
Subjective:  Sharon King is a 30 y.o. G1P0 at [redacted]w[redacted]d being seen today for ongoing prenatal care.  She is currently monitored for the following issues for this high-risk pregnancy and has ALLERGIC RHINITIS; ASTHMA; MIGRAINES, HX OF; Supervision of normal first pregnancy; Seizure disorder in pregnancy, antepartum ([place]); and Amniotic band diagnosed on [redacted]w[redacted]d ultrasound on her problem list.  Patient reports no complaints.  Contractions: Not present. Vag. Bleeding: None.  Movement: Present. Denies leaking of fluid.   The following portions of the patient's history were reviewed and updated as appropriate: allergies, current medications, past family history, past medical history, past social history, past surgical history and problem list. Problem list updated.  Objective:   Filed Vitals:   05/31/15 0952  BP: 127/83  Pulse: 112  Weight: 189 lb (85.73 kg)    Fetal Status: Fetal Heart Rate (bpm): 151 Fundal Height: 30 cm Movement: Present     General:  Alert, oriented and cooperative. Patient is in no acute distress.  Skin: Skin is warm and dry. No rash noted.   Cardiovascular: Normal heart rate noted  Respiratory: Normal respiratory effort, no problems with respiration noted  Abdomen: Soft, gravid, appropriate for gestational age. Pain/Pressure: Absent     Pelvic: Vag. Bleeding: None Vag D/C Character: Thin   Cervical exam deferred        Extremities: Normal range of motion.  Edema: None  Mental Status: Normal mood and affect. Normal behavior. Normal judgment and thought content.   Urinalysis: Urine Protein: Negative Urine Glucose: Negative  Assessment and Plan:  Pregnancy: G1P0 at [redacted]w[redacted]d  1. Encounter for supervision of normal first pregnancy in third trimester Continue routine prenatal care.  2. Seizure disorder in pregnancy, antepartum, second trimester ([place]) On no medications right now  3. Amniotic band in second trimester, not applicable or unspecified fetus For f/u u/s  for growth in 1 wk  Preterm labor symptoms and general obstetric precautions including but not limited to vaginal bleeding, contractions, leaking of fluid and fetal movement were reviewed in detail with the patient. Please refer to After Visit Summary for other counseling recommendations.  Return in 4 weeks (on 06/28/2015).   Donnamae Jude, MD

## 2015-06-07 ENCOUNTER — Encounter (HOSPITAL_COMMUNITY): Payer: Self-pay

## 2015-06-07 ENCOUNTER — Ambulatory Visit (HOSPITAL_COMMUNITY)
Admission: RE | Admit: 2015-06-07 | Discharge: 2015-06-07 | Disposition: A | Discharge status: Home / Self Care | Payer: 59 | Source: Ambulatory Visit | Attending: Family Medicine | Admitting: Family Medicine

## 2015-06-07 DIAGNOSIS — O328XX0 Maternal care for other malpresentation of fetus, not applicable or unspecified: Secondary | ICD-10-CM | POA: Diagnosis not present

## 2015-06-07 DIAGNOSIS — O42913 Preterm premature rupture of membranes, unspecified as to length of time between rupture and onset of labor, third trimester: Secondary | ICD-10-CM | POA: Diagnosis not present

## 2015-06-07 DIAGNOSIS — O418X20 Other specified disorders of amniotic fluid and membranes, second trimester, not applicable or unspecified: Secondary | ICD-10-CM

## 2015-06-07 NOTE — ED Notes (Signed)
Pt having left foot and right shoulder pain.

## 2015-06-08 ENCOUNTER — Inpatient Hospital Stay (HOSPITAL_COMMUNITY)
Admission: AD | Admit: 2015-06-08 | Discharge: 2015-06-15 | DRG: 765 | Disposition: A | Discharge status: Home / Self Care | Payer: 59 | Source: Ambulatory Visit | Attending: Obstetrics & Gynecology | Admitting: Obstetrics & Gynecology

## 2015-06-08 ENCOUNTER — Encounter (HOSPITAL_COMMUNITY): Payer: Self-pay | Admitting: *Deleted

## 2015-06-08 DIAGNOSIS — D62 Acute posthemorrhagic anemia: Secondary | ICD-10-CM | POA: Diagnosis not present

## 2015-06-08 DIAGNOSIS — O321XX0 Maternal care for breech presentation, not applicable or unspecified: Secondary | ICD-10-CM | POA: Diagnosis present

## 2015-06-08 DIAGNOSIS — O9962 Diseases of the digestive system complicating childbirth: Secondary | ICD-10-CM | POA: Diagnosis present

## 2015-06-08 DIAGNOSIS — K219 Gastro-esophageal reflux disease without esophagitis: Secondary | ICD-10-CM | POA: Diagnosis present

## 2015-06-08 DIAGNOSIS — O99214 Obesity complicating childbirth: Secondary | ICD-10-CM | POA: Diagnosis present

## 2015-06-08 DIAGNOSIS — Z3A31 31 weeks gestation of pregnancy: Secondary | ICD-10-CM | POA: Diagnosis not present

## 2015-06-08 DIAGNOSIS — O328XX0 Maternal care for other malpresentation of fetus, not applicable or unspecified: Principal | ICD-10-CM | POA: Diagnosis present

## 2015-06-08 DIAGNOSIS — O99354 Diseases of the nervous system complicating childbirth: Secondary | ICD-10-CM | POA: Diagnosis present

## 2015-06-08 DIAGNOSIS — O418X30 Other specified disorders of amniotic fluid and membranes, third trimester, not applicable or unspecified: Secondary | ICD-10-CM | POA: Diagnosis present

## 2015-06-08 DIAGNOSIS — E669 Obesity, unspecified: Secondary | ICD-10-CM | POA: Diagnosis present

## 2015-06-08 DIAGNOSIS — Z6832 Body mass index (BMI) 32.0-32.9, adult: Secondary | ICD-10-CM | POA: Diagnosis not present

## 2015-06-08 DIAGNOSIS — G40909 Epilepsy, unspecified, not intractable, without status epilepticus: Secondary | ICD-10-CM

## 2015-06-08 DIAGNOSIS — O9952 Diseases of the respiratory system complicating childbirth: Secondary | ICD-10-CM | POA: Diagnosis present

## 2015-06-08 DIAGNOSIS — O42919 Preterm premature rupture of membranes, unspecified as to length of time between rupture and onset of labor, unspecified trimester: Secondary | ICD-10-CM | POA: Diagnosis present

## 2015-06-08 DIAGNOSIS — O9081 Anemia of the puerperium: Secondary | ICD-10-CM | POA: Diagnosis not present

## 2015-06-08 DIAGNOSIS — O418X20 Other specified disorders of amniotic fluid and membranes, second trimester, not applicable or unspecified: Secondary | ICD-10-CM

## 2015-06-08 DIAGNOSIS — Z3A32 32 weeks gestation of pregnancy: Secondary | ICD-10-CM | POA: Diagnosis not present

## 2015-06-08 DIAGNOSIS — O99352 Diseases of the nervous system complicating pregnancy, second trimester: Secondary | ICD-10-CM

## 2015-06-08 DIAGNOSIS — O42913 Preterm premature rupture of membranes, unspecified as to length of time between rupture and onset of labor, third trimester: Secondary | ICD-10-CM | POA: Diagnosis present

## 2015-06-08 DIAGNOSIS — J4520 Mild intermittent asthma, uncomplicated: Secondary | ICD-10-CM | POA: Diagnosis present

## 2015-06-08 DIAGNOSIS — Z3403 Encounter for supervision of normal first pregnancy, third trimester: Secondary | ICD-10-CM

## 2015-06-08 LAB — GROUP B STREP BY PCR: GROUP B STREP BY PCR: NEGATIVE

## 2015-06-08 LAB — TYPE AND SCREEN
ABO/RH(D): A POS
Antibody Screen: NEGATIVE

## 2015-06-08 LAB — POCT FERN TEST: POCT FERN TEST: POSITIVE

## 2015-06-08 LAB — OB RESULTS CONSOLE GBS: GBS: NEGATIVE

## 2015-06-08 MED ORDER — DEXTROSE 5 % IV SOLN
500.0000 mg | INTRAVENOUS | Status: AC
  Administered 2015-06-08 – 2015-06-09 (×2): 500 mg via INTRAVENOUS
  Filled 2015-06-08 (×2): qty 500

## 2015-06-08 MED ORDER — CALCIUM CARBONATE ANTACID 500 MG PO CHEW
2.0000 | CHEWABLE_TABLET | ORAL | Status: DC | PRN
  Administered 2015-06-09: 400 mg via ORAL
  Filled 2015-06-08 (×2): qty 2

## 2015-06-08 MED ORDER — MAGNESIUM SULFATE BOLUS VIA INFUSION
4.0000 g | Freq: Once | INTRAVENOUS | Status: AC
  Administered 2015-06-08: 4 g via INTRAVENOUS
  Filled 2015-06-08: qty 500

## 2015-06-08 MED ORDER — ZOLPIDEM TARTRATE 5 MG PO TABS
5.0000 mg | ORAL_TABLET | Freq: Every evening | ORAL | Status: DC | PRN
  Administered 2015-06-10 – 2015-06-12 (×2): 5 mg via ORAL
  Filled 2015-06-08 (×2): qty 1

## 2015-06-08 MED ORDER — MAGNESIUM SULFATE 50 % IJ SOLN
2.0000 g/h | INTRAVENOUS | Status: DC
  Administered 2015-06-09: 2 g/h via INTRAVENOUS
  Filled 2015-06-08 (×2): qty 80

## 2015-06-08 MED ORDER — AZITHROMYCIN 500 MG PO TABS
500.0000 mg | ORAL_TABLET | Freq: Every day | ORAL | Status: DC
  Administered 2015-06-10 – 2015-06-13 (×4): 500 mg via ORAL
  Filled 2015-06-08 (×3): qty 2
  Filled 2015-06-08 (×3): qty 1

## 2015-06-08 MED ORDER — LACTATED RINGERS IV SOLN
INTRAVENOUS | Status: DC
  Administered 2015-06-08 – 2015-06-09 (×2): via INTRAVENOUS
  Administered 2015-06-09: 100 mL/h via INTRAVENOUS
  Administered 2015-06-09 – 2015-06-10 (×2): via INTRAVENOUS
  Administered 2015-06-10: 100 mL/h via INTRAVENOUS
  Administered 2015-06-11 – 2015-06-13 (×8): via INTRAVENOUS

## 2015-06-08 MED ORDER — BETAMETHASONE SOD PHOS & ACET 6 (3-3) MG/ML IJ SUSP
12.0000 mg | INTRAMUSCULAR | Status: AC
  Administered 2015-06-08 – 2015-06-09 (×2): 12 mg via INTRAMUSCULAR
  Filled 2015-06-08 (×2): qty 2

## 2015-06-08 MED ORDER — ACETAMINOPHEN 325 MG PO TABS
650.0000 mg | ORAL_TABLET | ORAL | Status: DC | PRN
  Administered 2015-06-10: 650 mg via ORAL
  Filled 2015-06-08: qty 2

## 2015-06-08 MED ORDER — AMOXICILLIN 500 MG PO CAPS
500.0000 mg | ORAL_CAPSULE | Freq: Three times a day (TID) | ORAL | Status: DC
  Administered 2015-06-10 – 2015-06-13 (×10): 500 mg via ORAL
  Filled 2015-06-08 (×13): qty 1

## 2015-06-08 MED ORDER — PROMETHAZINE HCL 25 MG/ML IJ SOLN
12.5000 mg | Freq: Four times a day (QID) | INTRAMUSCULAR | Status: DC | PRN
  Administered 2015-06-09: 12.5 mg via INTRAVENOUS
  Filled 2015-06-08 (×9): qty 1

## 2015-06-08 MED ORDER — DOCUSATE SODIUM 100 MG PO CAPS
100.0000 mg | ORAL_CAPSULE | Freq: Every day | ORAL | Status: DC
Start: 2015-06-08 — End: 2015-06-13
  Administered 2015-06-08 – 2015-06-13 (×5): 100 mg via ORAL
  Filled 2015-06-08 (×7): qty 1

## 2015-06-08 MED ORDER — SODIUM CHLORIDE 0.9 % IV SOLN
2.0000 g | Freq: Four times a day (QID) | INTRAVENOUS | Status: AC
  Administered 2015-06-09 – 2015-06-10 (×7): 2 g via INTRAVENOUS
  Filled 2015-06-08 (×8): qty 2000

## 2015-06-08 MED ORDER — FENTANYL CITRATE (PF) 100 MCG/2ML IJ SOLN
50.0000 ug | Freq: Once | INTRAMUSCULAR | Status: AC
  Administered 2015-06-08: 50 ug via INTRAVENOUS
  Filled 2015-06-08: qty 2

## 2015-06-08 MED ORDER — PRENATAL MULTIVITAMIN CH
1.0000 | ORAL_TABLET | Freq: Every day | ORAL | Status: DC
  Administered 2015-06-10 – 2015-06-13 (×4): 1 via ORAL
  Filled 2015-06-08 (×5): qty 1

## 2015-06-08 NOTE — [facility] Note (Signed)
Pt presents to [facility] with complaints of rupture of membranes today around 8pm. Denies any contractions or vaginal bleeding.

## 2015-06-08 NOTE — H&P (Signed)
Sharon King is a 30 y.o. female presenting for Leaking of fluid at 8pm. Maternal Medical History:  Reason for admission: Rupture of membranes.  Nausea. At 8pm  Contractions: Onset was 1-2 hours ago.   Frequency: irregular.    Fetal activity: Perceived fetal activity is normal.   Last perceived fetal movement was within the past hour.    Prenatal complications: No bleeding, HIV, PIH, IUGR, oligohydramnios, placental abnormality, polyhydramnios, pre-eclampsia or preterm labor.   Amniotic band seen, not encircling any extremities   Prenatal Complications - Diabetes: none.    OB History    Gravida Para Term Preterm AB TAB SAB Ectopic Multiple Living   1              Past Medical History  Diagnosis Date  . Migraine   . Migraines   . Seizures ([place])     Last seizure 2014   Past Surgical History  Procedure Laterality Date  . Breast enhancement surgery  2008  . Wisdom teeth     Family History: family history includes Arthritis in her mother; Depression in her mother; Hyperlipidemia in her mother. Social History:  reports that she has never smoked. She has never used smokeless tobacco. She reports that she drinks alcohol. She reports that she does not use illicit drugs.   Prenatal Transfer Tool  Maternal Diabetes: No Genetic Screening: Normal Maternal Ultrasounds/Referrals: Abnormal:  Findings:   Other: Fetal Ultrasounds or other Referrals:  None Maternal Substance Abuse:  No Significant Maternal Medications:  None Significant Maternal Lab Results:  None Other Comments:  Amniotic band seen, not encircling any extremity, GBS sent tonight  Review of Systems  Constitutional: Negative for fever, chills and malaise/fatigue.  Cardiovascular: Negative for leg swelling.  Gastrointestinal: Negative for nausea, vomiting, abdominal pain, diarrhea and constipation.  Genitourinary: Negative for dysuria.  Musculoskeletal: Negative for back pain.  Neurological: Negative for  weakness and headaches.      Last menstrual period 10/09/2014. Maternal Exam:  Uterine Assessment: Contraction strength is mild.  Contraction frequency is irregular.   Abdomen: Patient reports no abdominal tenderness. Fundal height is 31.   Estimated fetal weight is 3.   Fetal presentation: breech  Introitus: Normal vulva. Vagina is positive for vaginal discharge.  Ferning test: positive.  Nitrazine test: not done. Amniotic fluid character: clear.  Pelvis: adequate for delivery.   Cervix: Cervix evaluated by digital exam.     Fetal Exam Fetal Monitor Review: Mode: ultrasound.   Baseline rate: 140.  Variability: moderate (6-25 bpm).   Pattern: accelerations present and no decelerations.    Fetal State Assessment: Category I - tracings are normal.     Physical Exam  Constitutional: She is oriented to person, place, and time. She appears well-developed and well-nourished. No distress.  HENT:  Head: Normocephalic.  Cardiovascular: Normal rate, regular rhythm and normal heart sounds.   Respiratory: Effort normal and breath sounds normal. No respiratory distress. She has no wheezes. She has no rales.  GI: Soft. She exhibits no distension. There is no tenderness. There is no rebound and no guarding.  Genitourinary: Vaginal discharge found.  + gross rupture of membranes Cervix not seen No digital exam  Musculoskeletal: Normal range of motion.  Neurological: She is alert and oriented to person, place, and time.  Skin: Skin is warm and dry.  Psychiatric: She has a normal mood and affect.    Prenatal labs: ABO, Rh: A/POS/-- (09/01 1038) Antibody: NEG (09/01 1038) Rubella: 4.89 (09/01 1038) RPR: NON  REAC (02/01 1520)  HBsAg: NEGATIVE (09/01 1038)  HIV: NONREACTIVE (02/01 1520)  GBS:     Assessment/Plan: SIUP at [redacted]w[redacted]d  PPROM No labor, irregular contractions  Admit to Antenatal/BS bed Routine orders Seen also by Dr Roselie Awkward Antibiotics, Amp and Azithro (allergic  Emycin) Betamethasone Magnesium sulfate x 12 hours    [facility] 06/08/2015, 9:47 PM

## 2015-06-09 DIAGNOSIS — Z3A31 31 weeks gestation of pregnancy: Secondary | ICD-10-CM

## 2015-06-09 DIAGNOSIS — O42919 Preterm premature rupture of membranes, unspecified as to length of time between rupture and onset of labor, unspecified trimester: Secondary | ICD-10-CM

## 2015-06-09 LAB — ABO/RH: ABO/RH(D): A POS

## 2015-06-09 LAB — MAGNESIUM: MAGNESIUM: 5.3 mg/dL — AB (ref 1.7–2.4)

## 2015-06-09 MED ORDER — BUTORPHANOL TARTRATE 1 MG/ML IJ SOLN
2.0000 mg | Freq: Once | INTRAMUSCULAR | Status: AC
  Administered 2015-06-09: 2 mg via INTRAVENOUS
  Filled 2015-06-09: qty 2

## 2015-06-09 MED ORDER — EPHEDRINE 5 MG/ML INJ
10.0000 mg | INTRAVENOUS | Status: DC | PRN
  Filled 2015-06-09: qty 2

## 2015-06-09 MED ORDER — CYCLOBENZAPRINE HCL 10 MG PO TABS
10.0000 mg | ORAL_TABLET | Freq: Once | ORAL | Status: AC
  Administered 2015-06-09: 10 mg via ORAL
  Filled 2015-06-09 (×2): qty 1

## 2015-06-09 MED ORDER — GI COCKTAIL ~~LOC~~
30.0000 mL | Freq: Once | ORAL | Status: AC
  Administered 2015-06-09: 30 mL via ORAL
  Filled 2015-06-09: qty 30

## 2015-06-09 MED ORDER — LACTATED RINGERS IV SOLN: 500.0000 mL | Freq: Once | INTRAVENOUS | Status: DC

## 2015-06-09 MED ORDER — PHENYLEPHRINE 40 MCG/ML (10ML) SYRINGE FOR IV PUSH (FOR BLOOD PRESSURE SUPPORT)
80.0000 ug | PREFILLED_SYRINGE | INTRAVENOUS | Status: DC | PRN
  Filled 2015-06-09: qty 2

## 2015-06-09 MED ORDER — PROMETHAZINE HCL 25 MG/ML IJ SOLN
12.5000 mg | Freq: Four times a day (QID) | INTRAMUSCULAR | Status: DC | PRN
  Administered 2015-06-09 – 2015-06-12 (×8): 12.5 mg via INTRAVENOUS
  Administered 2015-06-13: 25 mg via INTRAVENOUS
  Administered 2015-06-13: 12.5 mg via INTRAVENOUS
  Filled 2015-06-09 (×2): qty 1

## 2015-06-09 MED ORDER — PROMETHAZINE HCL 25 MG/ML IJ SOLN: 12.5000 mg | Freq: Four times a day (QID) | INTRAMUSCULAR | Status: DC | PRN

## 2015-06-09 MED ORDER — DIPHENHYDRAMINE HCL 50 MG/ML IJ SOLN: 12.5000 mg | INTRAMUSCULAR | Status: DC | PRN

## 2015-06-09 MED ORDER — OXYCODONE-ACETAMINOPHEN 5-325 MG PO TABS
1.0000 | ORAL_TABLET | Freq: Four times a day (QID) | ORAL | Status: DC | PRN
  Administered 2015-06-09 – 2015-06-13 (×10): 2 via ORAL
  Filled 2015-06-09 (×10): qty 2

## 2015-06-09 MED ORDER — FENTANYL 2.5 MCG/ML BUPIVACAINE 1/10 % EPIDURAL INFUSION (WH - ANES): 14.0000 mL/h | INTRAMUSCULAR | Status: DC | PRN

## 2015-06-09 MED ORDER — FENTANYL CITRATE (PF) 100 MCG/2ML IJ SOLN
100.0000 ug | INTRAMUSCULAR | Status: DC | PRN
  Administered 2015-06-09 – 2015-06-10 (×5): 100 ug via INTRAVENOUS
  Filled 2015-06-09 (×5): qty 2

## 2015-06-09 NOTE — Progress Notes (Addendum)
Patient ID: Sharon King, female   DOB: [birth year], 30 y.o.   MRN: [id number] Starting to have pain again. Had pain earlier about 2330 and was given Fentanyl with relief.  Magnesium Sulfate bolus completed, now on drip  Filed Vitals:   06/08/15 2350 06/09/15 0002 06/09/15 0100 06/09/15 0226  BP: 100/83 115/65 133/60 121/69  Pulse: 107 110 108 109  Temp:   98.3 F (36.8 C)   TempSrc:   Oral   Resp: 18 18 18 18   Height:      Weight:       FHR reassuring Toco readjusted by me again to see if these are contractions.  States they are mostly in her back, rates them as an "8"  Looks like uterine irritability, some 20 seconds long every minute, other longer ones every 2-5 min  Cx deferred  WIll consult Dr Roselie Awkward  He recommends SVE  >>  FT/soft/long/high/unable to feel presenting part

## 2015-06-09 NOTE — Progress Notes (Signed)
Faculty Practice OB/GYN Attending Note  Subjective:  Called to evaluate patient with continued 8/10 pain despite Flexeril and Stadol administration. Patient reports pain is in lower abdomen, no pelvic pressures. FHR reassuring, no contractions, or vaginal bleeding. Good FM.   Admitted on 06/08/2015 for Preterm premature rupture of membranes (PPROM) with unknown onset of labor.   Objective:  Blood pressure 107/58, pulse 110, temperature 97.8 F (36.6 C), temperature source Oral, resp. rate 18, height 5\' 3"  (1.6 m), weight 187 lb (84.823 kg), last menstrual period 10/09/2014. FHT  Baseline 125bpm, moderate variability, +accelerations, no decelerations Toco: flat Gen: NAD HENT: Normocephalic, atraumatic Lungs: Normal respiratory effort Heart: Regular rate noted Abdomen: NT, gravid fundus, soft Cervix: Dilation: Fingertip Effacement (%): Thick Cervical Position: Middle Station: Ballotable Presentation: Undeterminable Exam by:: Kamillah Didonato No change from previous exam Ext: 2+ DTRs, no edema, no cyanosis, negative Homan's sign  Assessment & Plan:  30 y.o. G1P0 at [redacted]w[redacted]d admitted for PPROM, now with pain. No signs/symptoms of progressing PTL, reassuring FHT and no signs of intrauterine infection.  Will continue magnesium sulfate, betamethasone regimen, latency antibiotics.  Will also give analgesia as needed.  Patient and family informed that delivery will only be indicated for unstoppable PTL, chorioamniotis or fetal distress; pain alone is not an indication for labor.  Clear liquids for now.  Anesthesiology and Neonatology teams aware of patient's condition.  Will continue close observation.   Verita Schneiders, MD, [place] Attending [place], [facility]

## 2015-06-09 NOTE — Progress Notes (Signed)
Faculty Practice OB/GYN Attending Note  Subjective:  Called to evaluate patient with continued painful contractions despite Percocet administration. Patient reports pain is in lower abdomen, no pelvic pressure. FHR reassuring, no contractions, or vaginal bleeding. Good FM.   Admitted on 06/08/2015 for Preterm premature rupture of membranes (PPROM) with unknown onset of labor.   Objective:  Blood pressure 135/73, pulse 140, temperature 98.3 F (36.8 C), temperature source Oral, resp. rate 18, height 5\' 3"  (1.6 m), weight 187 lb (84.823 kg), last menstrual period 10/09/2014. FHT  Baseline 125bpm, moderate variability, +accelerations, no decelerations Toco: flat Gen: NAD HENT: Normocephalic, atraumatic Lungs: Normal respiratory effort Heart: Regular rate noted Abdomen: NT, gravid fundus, soft Cervix: Dilation: Fingertip Effacement (%): Thick Cervical Position: Middle Station: Ballotable Presentation: Undeterminable Exam by:: Nghia Mcentee No change from previous exams  Assessment & Plan:  30 y.o. G1P0 at [redacted]w[redacted]d admitted for PPROM with painful contractions and no cervical change.  Will give another dose of Stadol and Phenergan to see if gets a better response from this. No signs/symptoms of progressing PTL, reassuring FHT and no signs of intrauterine infection.  Will continue magnesium sulfate, betamethasone regimen, latency antibiotics.  Patient and family re-informed that delivery will only be indicated for unstoppable PTL, chorioamniotis or fetal distress; pain alone is not an indication for labor.  Clear liquids for now.  Anesthesiology and Neonatology teams aware of patient's condition.  Will continue close observation.   Verita Schneiders, MD, [place] Attending [place], [facility]

## 2015-06-10 LAB — CBC
HCT: 25.8 % — ABNORMAL LOW (ref 36.0–46.0)
Hemoglobin: 8 g/dL — ABNORMAL LOW (ref 12.0–15.0)
MCH: 25.2 pg — ABNORMAL LOW (ref 26.0–34.0)
MCHC: 31 g/dL (ref 30.0–36.0)
MCV: 81.1 fL (ref 78.0–100.0)
Platelets: 396 10*3/uL (ref 150–400)
RBC: 3.18 MIL/uL — ABNORMAL LOW (ref 3.87–5.11)
RDW: 16.2 % — AB (ref 11.5–15.5)
WBC: 20 10*3/uL — ABNORMAL HIGH (ref 4.0–10.5)

## 2015-06-10 MED ORDER — DOXYLAMINE SUCCINATE (SLEEP) 25 MG PO TABS
25.0000 mg | ORAL_TABLET | Freq: Every evening | ORAL | Status: DC | PRN
  Filled 2015-06-10: qty 1

## 2015-06-10 MED ORDER — LACTATED RINGERS IV BOLUS (SEPSIS)
1000.0000 mL | Freq: Once | INTRAVENOUS | Status: AC
  Administered 2015-06-10: 1000 mL via INTRAVENOUS

## 2015-06-10 NOTE — Progress Notes (Addendum)
Faculty Practice OB/GYN Attending Note  Subjective:  Patient denies nay current pain, no pelvic pressure. FHR reassuring, no contractions, or vaginal bleeding. Good FM.   Admitted on 06/08/2015 for Preterm premature rupture of membranes (PPROM) with unknown onset of labor.  Breech presentation.   Objective:  Blood pressure 105/50, pulse 112, temperature 98 F (36.7 C), temperature source Oral, resp. rate 16, height 5\' 3"  (1.6 m), weight 187 lb (84.823 kg), last menstrual period 10/09/2014. FHT  Baseline 125 bpm, moderate variability, +accelerations, no decelerations Toco: flat Gen: NAD HENT: Normocephalic, atraumatic Lungs: Normal respiratory effort Heart: Regular rate noted Abdomen: NT, gravid fundus, soft Cervix: Deferred  Assessment & Plan:  30 y.o. G1P0 at [redacted]w[redacted]d admitted for PPROM with painful contractions and no cervical change.   No signs/symptoms of progressing PTL, reassuring FHT and no signs of intrauterine infection.   - Will discontinue magnesium sulfate as she is s/p betamethasone regimen.   - Continue latency antibiotics.   - Delivery will only be indicated for unstoppable PTL, chorioamnionitis or fetal distress; pain alone is not an indication for labor.   - Continue close inpatient observation.   Verita Schneiders, MD, [place] Attending [place], [facility]

## 2015-06-10 NOTE — Consult Note (Signed)
Neonatology Consult  Note:  At the request of the patients obstetrician Dr. Harolyn Rutherford I met with Mindi Slicker and her husband.  She is currently 31 5 weeks with pregnancy complicated by SROM on 1/89 and amniotic band noted on [place] - not encircling any extremities.     She is being treated with latency antibiotics, has received betamethasone and magnesium sulfate x 12 hours. We reviewed initial delivery room management, including CPAP, Halfway, and low but certainly possible need for intubation for surfactant administration.  We discussed feeding immaturity and need for full po intake with multiple days of good weight gain and no apnea or bradycardia before discharge.  We reviewed increased risk of jaundice, infection, and temperature instability.   Discussed likely length of stay.  Thank you for allowing [place] to participate in her care.  Please call with questions.  Higinio Roger, DO  Neonatologist  The total length of face-to-face or floor / unit time for this encounter was 25 minutes.  Counseling and / or coordination of care was greater than fifty percent of the time.

## 2015-06-11 DIAGNOSIS — O42913 Preterm premature rupture of membranes, unspecified as to length of time between rupture and onset of labor, third trimester: Secondary | ICD-10-CM

## 2015-06-11 DIAGNOSIS — Z3A31 31 weeks gestation of pregnancy: Secondary | ICD-10-CM

## 2015-06-11 NOTE — Progress Notes (Signed)
Patient ID: Karah Shomper, female   DOB: [birth year], 30 y.o.   MRN: [id number]  [place] ANTEPARTUM NOTE  Janni Biswell is a 30 y.o. G1P0 at [redacted]w[redacted]d  who is admitted for rupture of membranes.   Fetal presentation is breech. Length of Stay:  3  Days  Subjective: Continues to leak clear fluid.   Patient reports good fetal movement.   She reports occasional uterine contractions She reports no bleeding    Vitals:  Blood pressure 105/62, pulse 83, temperature 98.2 F (36.8 C), temperature source Oral, resp. rate 20, height 5\' 3"  (1.6 m), weight 187 lb (84.823 kg), last menstrual period 10/09/2014, SpO2 97 %. Physical Examination:  General appearance - alert, well appearing, and in no distress Chest - clear to auscultation, no wheezes, rales or rhonchi, symmetric air entry Heart - normal rate, regular rhythm, normal S1, S2, no murmurs, rubs, clicks or gallops Abdomen - soft, nontender, nondistended, no masses or organomegaly Fundal Height:  size equals dates Pelvic Exam:  normal external genitalia, vulva, vagina, cervix, uterus and adnexa Extremities: extremities normal, atraumatic, no cyanosis or edema  Membranes:ruptured, clear fluid  Fetal Monitoring:  Baseline: 130 bpm, Variability: Good {> 6 bpm), Accelerations: Reactive and Decelerations: Variable: moderate  Labs:  Results for orders placed or performed during the hospital encounter of 06/08/15 (from the past 24 hour(s))  CBC   Collection Time: 06/10/15  6:31 PM  Result Value Ref Range   WBC 20.0 (H) 4.0 - 10.5 K/uL   RBC 3.18 (L) 3.87 - 5.11 MIL/uL   Hemoglobin 8.0 (L) 12.0 - 15.0 g/dL   HCT 25.8 (L) 36.0 - 46.0 %   MCV 81.1 78.0 - 100.0 fL   MCH 25.2 (L) 26.0 - 34.0 pg   MCHC 31.0 30.0 - 36.0 g/dL   RDW 16.2 (H) 11.5 - 15.5 %   Platelets 396 150 - 400 K/uL    Imaging Studies:       Medications:  Scheduled . amoxicillin  500 mg Oral Q8H  . azithromycin  500 mg Oral Daily  . docusate sodium  100  mg Oral Daily  . lactated ringers  500 mL Intravenous Once  . prenatal multivitamin  1 tablet Oral Q1200   I have reviewed the patient's current medications.  ASSESSMENT: Patient Active Problem List   Diagnosis Date Noted  . Preterm premature rupture of membranes (PPROM) with unknown onset of labor 06/08/2015  . 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 Supervision of normal first pregnancy 12/13/2014  . Seizure disorder in pregnancy, antepartum ([place]) 12/13/2014  . ALLERGIC RHINITIS 04/22/2007  . ASTHMA 11/06/2006  . MIGRAINES, HX OF 11/06/2006    PLAN: 1.  PPROM  Continue latency antibiotics: day# 4 of 7- on PO  Continue continuous monitoring.  Has variable deceleration about 1 every 20-30 minutes with one prolonged deceleration last night.  Otherwise, FHT reactive. 2.  Amniotic band on lower extremity  Discussed with MFM - no intervention at this point.  No need to follow up as pt will deliver before needs growth 3.  Breech  Will need cesarean section Continue routine antenatal care.   Tanna Savoy Stinson, DO 06/11/2015,7:50 AM

## 2015-06-12 DIAGNOSIS — Z3A32 32 weeks gestation of pregnancy: Secondary | ICD-10-CM

## 2015-06-12 DIAGNOSIS — O42913 Preterm premature rupture of membranes, unspecified as to length of time between rupture and onset of labor, third trimester: Secondary | ICD-10-CM

## 2015-06-12 LAB — CBC
HEMATOCRIT: 27.9 % — AB (ref 36.0–46.0)
HEMOGLOBIN: 8.4 g/dL — AB (ref 12.0–15.0)
MCH: 24.8 pg — AB (ref 26.0–34.0)
MCHC: 30.1 g/dL (ref 30.0–36.0)
MCV: 82.3 fL (ref 78.0–100.0)
Platelets: 376 10*3/uL (ref 150–400)
RBC: 3.39 MIL/uL — ABNORMAL LOW (ref 3.87–5.11)
RDW: 16.6 % — AB (ref 11.5–15.5)
WBC: 14.9 10*3/uL — ABNORMAL HIGH (ref 4.0–10.5)

## 2015-06-12 NOTE — Progress Notes (Signed)
Patient ID: Sharon King, female   DOB: [birth year], 30 y.o.   MRN: [id number] [place]) NOTE  Sharon King is a 30 y.o. G1P0 at [redacted]w[redacted]d  who is admitted for PPROM.   Length of Stay:  4  Days  Subjective: Pt has some lower abdominal, suprapubic pain.   Patient reports the fetal movement as active. Patient reports uterine contraction  activity as none. Patient reports  vaginal bleeding as none. Patient describes fluid per vagina as Clear.  Vitals:  Blood pressure 114/53, pulse 104, temperature 98.3 F (36.8 C), temperature source Oral, resp. rate 14, height 5\' 3"  (1.6 m), weight 187 lb (84.823 kg), last menstrual period 10/09/2014, SpO2 97 %. Physical Examination:  General appearance - alert, well appearing, and in no distress Abdomen - soft, mildly tender, but unchanged from yesterday Extremities - Homan's sign negative bilaterally  Fetal Monitoring:  Baseline: 150 bpm, Variability: Good {> 6 bpm), Accelerations: Reactive and Decelerations: variables and deelerations lasting up to 2 mins.  Spontaneous return to baseline.    Labs:  CBC CBC Latest Ref Rng 06/10/2015 05/15/2015 12/13/2014  WBC 4.0 - 10.5 K/uL 20.0(H) 14.0(H) 9.4  Hemoglobin 12.0 - 15.0 g/dL 8.0(L) 10.0(L) 13.2  Hematocrit 36.0 - 46.0 % 25.8(L) 30.6(L) 40.7  Platelets 150 - 400 K/uL 396 422(H) 404(H)      Medications:  Scheduled . amoxicillin  500 mg Oral Q8H  . azithromycin  500 mg Oral Daily  . docusate sodium  100 mg Oral Daily  . lactated ringers  500 mL Intravenous Once  . prenatal multivitamin  1 tablet Oral Q1200   I have reviewed the patient's current medications.  ASSESSMENT: Patient Active Problem List   Diagnosis Date Noted  . Preterm premature rupture of membranes (PPROM) with unknown onset of labor 06/08/2015  . 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 Supervision of normal first pregnancy 12/13/2014  . Seizure disorder in pregnancy,  antepartum ([place]) 12/13/2014  . ALLERGIC RHINITIS 04/22/2007  . ASTHMA 11/06/2006  . MIGRAINES, HX OF 11/06/2006    PLAN: -Continue latency antibiotics -cbc today -continuous monitoring due to frequency of variables -watch for signs of chorio and labor.  Ersa Delaney H. 06/12/2015,7:01 AM

## 2015-06-13 ENCOUNTER — Encounter (HOSPITAL_COMMUNITY): Payer: Self-pay | Admitting: Obstetrics & Gynecology

## 2015-06-13 ENCOUNTER — Inpatient Hospital Stay (HOSPITAL_COMMUNITY): Payer: 59 | Admitting: Anesthesiology

## 2015-06-13 ENCOUNTER — Encounter (HOSPITAL_COMMUNITY)
Admission: AD | Disposition: A | Discharge status: Home / Self Care | Payer: Self-pay | Source: Ambulatory Visit | Attending: Obstetrics & Gynecology

## 2015-06-13 DIAGNOSIS — O321XX0 Maternal care for breech presentation, not applicable or unspecified: Secondary | ICD-10-CM

## 2015-06-13 DIAGNOSIS — Z3A32 32 weeks gestation of pregnancy: Secondary | ICD-10-CM

## 2015-06-13 DIAGNOSIS — O42913 Preterm premature rupture of membranes, unspecified as to length of time between rupture and onset of labor, third trimester: Secondary | ICD-10-CM

## 2015-06-13 LAB — TYPE AND SCREEN
ABO/RH(D): A POS
Antibody Screen: NEGATIVE

## 2015-06-13 SURGERY — Surgical Case
Anesthesia: Spinal | Site: Abdomen

## 2015-06-13 MED ORDER — PROMETHAZINE HCL 25 MG/ML IJ SOLN: 25.0000 mg | Freq: Four times a day (QID) | INTRAMUSCULAR | Status: DC | PRN

## 2015-06-13 MED ORDER — LACTATED RINGERS IV SOLN
40.0000 [IU] | INTRAVENOUS | Status: DC | PRN
  Administered 2015-06-13: 40 [IU] via INTRAVENOUS

## 2015-06-13 MED ORDER — DIBUCAINE 1 % RE OINT: 1.0000 "application " | TOPICAL_OINTMENT | RECTAL | Status: DC | PRN

## 2015-06-13 MED ORDER — FENTANYL CITRATE (PF) 100 MCG/2ML IJ SOLN: 25.0000 ug | INTRAMUSCULAR | Status: DC | PRN

## 2015-06-13 MED ORDER — MENTHOL 3 MG MT LOZG: 1.0000 | LOZENGE | OROMUCOSAL | Status: DC | PRN

## 2015-06-13 MED ORDER — SIMETHICONE 80 MG PO CHEW
80.0000 mg | CHEWABLE_TABLET | ORAL | Status: DC
  Administered 2015-06-13 – 2015-06-14 (×2): 80 mg via ORAL
  Filled 2015-06-13 (×2): qty 1

## 2015-06-13 MED ORDER — CITRIC ACID-SODIUM CITRATE 334-500 MG/5ML PO SOLN
ORAL | Status: AC
  Administered 2015-06-13: 30 mL
  Filled 2015-06-13: qty 15

## 2015-06-13 MED ORDER — LACTATED RINGERS IV SOLN: INTRAVENOUS | Status: DC

## 2015-06-13 MED ORDER — LACTATED RINGERS IV BOLUS (SEPSIS)
1000.0000 mL | Freq: Once | INTRAVENOUS | Status: AC
  Administered 2015-06-13: 1000 mL via INTRAVENOUS

## 2015-06-13 MED ORDER — PHENYLEPHRINE 8 MG IN D5W 100 ML (0.08MG/ML) PREMIX OPTIME
INJECTION | INTRAVENOUS | Status: DC | PRN
  Administered 2015-06-13: 60 ug/min via INTRAVENOUS

## 2015-06-13 MED ORDER — SIMETHICONE 80 MG PO CHEW: 80.0000 mg | CHEWABLE_TABLET | ORAL | Status: DC | PRN

## 2015-06-13 MED ORDER — MORPHINE SULFATE (PF) 0.5 MG/ML IJ SOLN
INTRAMUSCULAR | Status: AC
Start: 2015-06-13 — End: 2015-06-13
  Filled 2015-06-13: qty 10

## 2015-06-13 MED ORDER — BUPIVACAINE HCL (PF) 0.5 % IJ SOLN
INTRAMUSCULAR | Status: AC
  Filled 2015-06-13: qty 30

## 2015-06-13 MED ORDER — FENTANYL CITRATE (PF) 100 MCG/2ML IJ SOLN
INTRAMUSCULAR | Status: DC | PRN
  Administered 2015-06-13: 20 ug via INTRATHECAL

## 2015-06-13 MED ORDER — FENTANYL CITRATE (PF) 100 MCG/2ML IJ SOLN
INTRAMUSCULAR | Status: AC
  Filled 2015-06-13: qty 2

## 2015-06-13 MED ORDER — MEASLES, MUMPS & RUBELLA VAC ~~LOC~~ INJ: 0.5000 mL | INJECTION | Freq: Once | SUBCUTANEOUS | Status: DC

## 2015-06-13 MED ORDER — OXYTOCIN 10 UNIT/ML IJ SOLN
2.5000 [IU]/h | INTRAMUSCULAR | Status: AC
  Administered 2015-06-13: 2.5 [IU]/h via INTRAVENOUS

## 2015-06-13 MED ORDER — KETOROLAC TROMETHAMINE 30 MG/ML IJ SOLN
30.0000 mg | Freq: Four times a day (QID) | INTRAMUSCULAR | Status: DC | PRN
  Administered 2015-06-13: 30 mg via INTRAMUSCULAR

## 2015-06-13 MED ORDER — PROMETHAZINE HCL 25 MG PO TABS
25.0000 mg | ORAL_TABLET | Freq: Four times a day (QID) | ORAL | Status: DC | PRN
  Administered 2015-06-13: 25 mg via ORAL
  Filled 2015-06-13: qty 1

## 2015-06-13 MED ORDER — DIPHENHYDRAMINE HCL 25 MG PO CAPS: 25.0000 mg | ORAL_CAPSULE | Freq: Four times a day (QID) | ORAL | Status: DC | PRN

## 2015-06-13 MED ORDER — SENNOSIDES-DOCUSATE SODIUM 8.6-50 MG PO TABS
2.0000 | ORAL_TABLET | ORAL | Status: DC
  Administered 2015-06-13 – 2015-06-14 (×2): 2 via ORAL
  Filled 2015-06-13 (×2): qty 2

## 2015-06-13 MED ORDER — ONDANSETRON HCL 4 MG/2ML IJ SOLN
INTRAMUSCULAR | Status: AC
  Filled 2015-06-13: qty 2

## 2015-06-13 MED ORDER — WITCH HAZEL-GLYCERIN EX PADS: 1.0000 "application " | MEDICATED_PAD | CUTANEOUS | Status: DC | PRN

## 2015-06-13 MED ORDER — IBUPROFEN 600 MG PO TABS
600.0000 mg | ORAL_TABLET | Freq: Four times a day (QID) | ORAL | Status: DC
  Administered 2015-06-13 – 2015-06-15 (×8): 600 mg via ORAL
  Filled 2015-06-13 (×8): qty 1

## 2015-06-13 MED ORDER — SCOPOLAMINE 1 MG/3DAYS TD PT72
MEDICATED_PATCH | TRANSDERMAL | Status: AC
  Filled 2015-06-13: qty 1

## 2015-06-13 MED ORDER — BUPIVACAINE IN DEXTROSE 0.75-8.25 % IT SOLN
INTRATHECAL | Status: DC | PRN
  Administered 2015-06-13: 12 mg via INTRATHECAL

## 2015-06-13 MED ORDER — MORPHINE SULFATE (PF) 0.5 MG/ML IJ SOLN
INTRAMUSCULAR | Status: DC | PRN
  Administered 2015-06-13: .2 mg via INTRATHECAL

## 2015-06-13 MED ORDER — BUPIVACAINE HCL (PF) 0.5 % IJ SOLN
INTRAMUSCULAR | Status: DC | PRN
  Administered 2015-06-13: 30 mL

## 2015-06-13 MED ORDER — KETOROLAC TROMETHAMINE 30 MG/ML IJ SOLN
INTRAMUSCULAR | Status: AC
  Administered 2015-06-13: 30 mg via INTRAMUSCULAR
  Filled 2015-06-13: qty 1

## 2015-06-13 MED ORDER — PRENATAL MULTIVITAMIN CH
1.0000 | ORAL_TABLET | Freq: Every day | ORAL | Status: DC
  Administered 2015-06-14 – 2015-06-15 (×2): 1 via ORAL
  Filled 2015-06-13 (×2): qty 1

## 2015-06-13 MED ORDER — ACETAMINOPHEN 325 MG PO TABS: 650.0000 mg | ORAL_TABLET | ORAL | Status: DC | PRN

## 2015-06-13 MED ORDER — CEFAZOLIN SODIUM-DEXTROSE 2-3 GM-% IV SOLR
INTRAVENOUS | Status: DC | PRN
  Administered 2015-06-13: 2 g via INTRAVENOUS

## 2015-06-13 MED ORDER — OXYCODONE-ACETAMINOPHEN 5-325 MG PO TABS
1.0000 | ORAL_TABLET | ORAL | Status: DC | PRN
  Filled 2015-06-13: qty 1

## 2015-06-13 MED ORDER — SIMETHICONE 80 MG PO CHEW
80.0000 mg | CHEWABLE_TABLET | Freq: Three times a day (TID) | ORAL | Status: DC
  Administered 2015-06-14 – 2015-06-15 (×5): 80 mg via ORAL
  Filled 2015-06-13 (×4): qty 1

## 2015-06-13 MED ORDER — PHENYLEPHRINE 8 MG IN D5W 100 ML (0.08MG/ML) PREMIX OPTIME
INJECTION | INTRAVENOUS | Status: AC
  Filled 2015-06-13: qty 100

## 2015-06-13 MED ORDER — OXYCODONE-ACETAMINOPHEN 5-325 MG PO TABS
2.0000 | ORAL_TABLET | ORAL | Status: DC | PRN
  Administered 2015-06-13 – 2015-06-15 (×10): 2 via ORAL
  Filled 2015-06-13 (×10): qty 2

## 2015-06-13 MED ORDER — MEPERIDINE HCL 25 MG/ML IJ SOLN: 6.2500 mg | INTRAMUSCULAR | Status: DC | PRN

## 2015-06-13 MED ORDER — PHENYLEPHRINE HCL 10 MG/ML IJ SOLN
INTRAMUSCULAR | Status: DC | PRN
  Administered 2015-06-13 (×2): 80 ug via INTRAVENOUS
  Administered 2015-06-13: 40 ug via INTRAVENOUS

## 2015-06-13 MED ORDER — LANOLIN HYDROUS EX OINT: 1.0000 "application " | TOPICAL_OINTMENT | CUTANEOUS | Status: DC | PRN

## 2015-06-13 MED ORDER — SCOPOLAMINE 1 MG/3DAYS TD PT72
MEDICATED_PATCH | TRANSDERMAL | Status: DC | PRN
  Administered 2015-06-13: 1 via TRANSDERMAL

## 2015-06-13 MED ORDER — ZOLPIDEM TARTRATE 5 MG PO TABS
5.0000 mg | ORAL_TABLET | Freq: Every evening | ORAL | Status: DC | PRN
  Administered 2015-06-14 (×2): 5 mg via ORAL
  Filled 2015-06-13 (×2): qty 1

## 2015-06-13 MED ORDER — KETOROLAC TROMETHAMINE 30 MG/ML IJ SOLN: 30.0000 mg | Freq: Four times a day (QID) | INTRAMUSCULAR | Status: DC | PRN

## 2015-06-13 MED ORDER — TETANUS-DIPHTH-ACELL PERTUSSIS 5-2.5-18.5 LF-MCG/0.5 IM SUSP: 0.5000 mL | Freq: Once | INTRAMUSCULAR | Status: DC

## 2015-06-13 MED ORDER — LACTATED RINGERS IV SOLN
INTRAVENOUS | Status: DC | PRN
  Administered 2015-06-13: 19:00:00 via INTRAVENOUS

## 2015-06-13 MED ORDER — OXYTOCIN 10 UNIT/ML IJ SOLN
INTRAMUSCULAR | Status: AC
  Filled 2015-06-13: qty 4

## 2015-06-13 MED ORDER — ONDANSETRON HCL 4 MG/2ML IJ SOLN
INTRAMUSCULAR | Status: DC | PRN
  Administered 2015-06-13: 4 mg via INTRAVENOUS

## 2015-06-13 SURGICAL SUPPLY — 37 items
APL SKNCLS STERI-STRIP NONHPOA (GAUZE/BANDAGES/DRESSINGS) ×1
BENZOIN TINCTURE PRP APPL 2/3 (GAUZE/BANDAGES/DRESSINGS) ×2 IMPLANT
BLADE TIP J-PLASMA PRECISE LAP (MISCELLANEOUS) ×2 IMPLANT
CLAMP CORD UMBIL (MISCELLANEOUS) IMPLANT
CLOTH BEACON ORANGE TIMEOUT ST (SAFETY) ×2 IMPLANT
DRSG OPSITE POSTOP 4X10 (GAUZE/BANDAGES/DRESSINGS) ×2 IMPLANT
DURAPREP 26ML APPLICATOR (WOUND CARE) ×2 IMPLANT
ELECT REM PT RETURN 9FT ADLT (ELECTROSURGICAL) ×2
ELECTRODE REM PT RTRN 9FT ADLT (ELECTROSURGICAL) ×1 IMPLANT
EXTRACTOR VACUUM KIWI (MISCELLANEOUS) IMPLANT
GLOVE BIO SURGEON STRL SZ7 (GLOVE) ×2 IMPLANT
GLOVE BIOGEL PI IND STRL 7.0 (GLOVE) ×2 IMPLANT
GLOVE BIOGEL PI INDICATOR 7.0 (GLOVE) ×2
GOWN STRL REUS W/TWL LRG LVL3 (GOWN DISPOSABLE) ×4 IMPLANT
GOWN STRL REUS W/TWL XL LVL3 (GOWN DISPOSABLE) ×2 IMPLANT
KIT ABG SYR 3ML LUER SLIP (SYRINGE) IMPLANT
NDL HYPO 25X5/8 SAFETYGLIDE (NEEDLE) IMPLANT
NEEDLE HYPO 22GX1.5 SAFETY (NEEDLE) ×2 IMPLANT
NEEDLE HYPO 25X5/8 SAFETYGLIDE (NEEDLE) IMPLANT
NS IRRIG 1000ML POUR BTL (IV SOLUTION) ×2 IMPLANT
PACK C SECTION WH (CUSTOM PROCEDURE TRAY) ×2 IMPLANT
PAD OB MATERNITY 4.3X12.25 (PERSONAL CARE ITEMS) ×2 IMPLANT
PENCIL BUTTON HOLSTER BLD 10FT (ELECTRODE) ×1 IMPLANT
PENCIL SMOKE EVAC W/HOLSTER (ELECTROSURGICAL) ×2 IMPLANT
RTRCTR C-SECT PINK 25CM LRG (MISCELLANEOUS) IMPLANT
SPONGE SURGIFOAM ABS GEL 12-7 (HEMOSTASIS) IMPLANT
STRIP CLOSURE SKIN 1/2X4 (GAUZE/BANDAGES/DRESSINGS) ×2 IMPLANT
SUT PDS AB 0 CTX 60 (SUTURE) IMPLANT
SUT PLAIN 0 NONE (SUTURE) IMPLANT
SUT SILK 0 TIES 10X30 (SUTURE) IMPLANT
SUT VIC AB 0 CT1 36 (SUTURE) ×6 IMPLANT
SUT VIC AB 3-0 CT1 27 (SUTURE) ×2
SUT VIC AB 3-0 CT1 TAPERPNT 27 (SUTURE) ×1 IMPLANT
SUT VIC AB 4-0 KS 27 (SUTURE) IMPLANT
SYR CONTROL 10ML LL (SYRINGE) ×2 IMPLANT
TOWEL OR 17X24 6PK STRL BLUE (TOWEL DISPOSABLE) ×2 IMPLANT
TRAY FOLEY CATH SILVER 14FR (SET/KITS/TRAYS/PACK) ×2 IMPLANT

## 2015-06-13 NOTE — Progress Notes (Signed)
Patient ID: Sharon King, female   DOB: Oct 21, 1985, 30 y.o.   MRN: [id number] Pt with increasing abdominal pain and tenderness. Not responsive to pain meds and IV fluids.  Pt now c/o nausea and feeling flushed.  She is currently afebrile but, had Percocet recently.  She was evaluated ~1 hour previously and appears to be worse clinically than at that time.  FHR 150'S reactive.  Sono confirmed that fetus is still breech.  The risks of cesarean section discussed with the patient included but were not limited to: bleeding which may require transfusion or reoperation; infection which may require antibiotics; injury to bowel, bladder, ureters or other surrounding organs; injury to the fetus; need for additional procedures including hysterectomy in the event of a life-threatening hemorrhage; placental abnormalities wth subsequent pregnancies, incisional problems, thromboembolic phenomenon and other postoperative/anesthesia complications. The patient concurred with the proposed plan, giving informed written consent for the procedure.   Patient has been NPO since 3:30pm she will remain NPO for procedure. Anesthesia and OR aware. Preoperative prophylactic antibiotics and SCDs ordered on call to the OR.  To OR when ready.  Evelisse Szalkowski L. Harraway-Smith, M.D., [place]

## 2015-06-13 NOTE — Transfer of Care (Signed)
Immediate Anesthesia Transfer of Care Note  Patient: Sharon King  Procedure(s) Performed: Procedure(s): CESAREAN SECTION (N/A)  Patient Location: PACU  Anesthesia Type:Spinal  Level of Consciousness: awake, alert  and oriented  Airway & Oxygen Therapy: Patient Spontanous Breathing  Post-op Assessment: Report given to RN and Post -op Vital signs reviewed and stable  Post vital signs: Reviewed and stable  Last Vitals:  Filed Vitals:   06/13/15 1700 06/13/15 1810  BP:  137/84  Pulse:  116  Temp: 37 C   Resp:      Complications: No apparent anesthesia complications

## 2015-06-13 NOTE — Progress Notes (Signed)
Off monitor to go to Operating room

## 2015-06-13 NOTE — Anesthesia Procedure Notes (Signed)
Spinal Patient location during procedure: OR Start time: 06/13/2015 6:30 PM Staffing Anesthesiologist: Josephine Igo Performed by: anesthesiologist  Preanesthetic Checklist Completed: patient identified, site marked, surgical consent, pre-op evaluation, timeout performed, IV checked, risks and benefits discussed and monitors and equipment checked Spinal Block Patient position: sitting Prep: site prepped and draped and DuraPrep Patient monitoring: heart rate, cardiac monitor, continuous pulse ox and blood pressure Approach: midline Location: L3-4 Injection technique: single-shot Needle Needle type: Sprotte  Needle gauge: 24 G Needle length: 9 cm Needle insertion depth: 5 cm Assessment Sensory level: T4 Additional Notes Patient tolerated procedure well. Adequate sensory level.

## 2015-06-13 NOTE — Brief Op Note (Signed)
06/08/2015 - 06/13/2015  7:36 PM  PATIENT:  Sharon King  30 y.o. female  PRE-OPERATIVE DIAGNOSIS:  PREMATURE RUPTURE OF MEMBRANES, BREECH, LABOR  POST-OPERATIVE DIAGNOSIS:  CESAREAN SECTION  PROCEDURE:  Procedure(s): CESAREAN SECTION (N/A)  SURGEON:  Surgeon(s) and Role:    * Lavonia Drafts, MD - Primary    * Caren Macadam, MD - Assisting  PHYSICIAN ASSISTANT:   ASSISTANTS: Luiz Blare PGY2   ANESTHESIA:   local and spinal  EBL:  Total I/O In: 600 [I.V.:600] Out: 1300 [Urine:800; Blood:500]  BLOOD ADMINISTERED:none  DRAINS: none   LOCAL MEDICATIONS USED:  MARCAINE     SPECIMEN:  Source of Specimen:  Placenta  DISPOSITION OF SPECIMEN:  PATHOLOGY  COUNTS:  YES  TOURNIQUET:  * No tourniquets in log *  DICTATION: .Note written in EPIC  PLAN OF CARE: Admit to inpatient   PATIENT DISPOSITION:  PACU - hemodynamically stable.   Delay start of Pharmacological VTE agent (>24hrs) due to surgical blood loss or risk of bleeding: no

## 2015-06-13 NOTE — Op Note (Signed)
06/08/2015 - 06/13/2015   7:36 PM  PATIENT:  Sharon King  31 y.o. female  PRE-OPERATIVE DIAGNOSIS:  PREMATURE RUPTURE OF MEMBRANES, BREECH, LABOR  POST-OPERATIVE DIAGNOSIS:  CESAREAN SECTION  PROCEDURE:  Procedure(s): CESAREAN SECTION (N/A)  SURGEON:  Surgeon(s) and Role:    * Lavonia Drafts, MD - Primary    * Caren Macadam, MD - Assisting  PHYSICIAN ASSISTANT:   ASSISTANTS: Luiz Blare PGY2   ANESTHESIA:   local and spinal  EBL:  Total I/O In: 600 [I.V.:600] Out: 1300 [Urine:800; Blood:500]  BLOOD ADMINISTERED:none  DRAINS: none   LOCAL MEDICATIONS USED:  MARCAINE     SPECIMEN:  Source of Specimen:  Placenta  DISPOSITION OF SPECIMEN:  PATHOLOGY   INDICATIONS: Shauntrell Simental is a 30 y.o. G1P0100 at [redacted]w[redacted]d here for cesarean section secondary to the indications listed under preoperative diagnoses; please see preoperative note for further details.  The risks of cesarean section were discussed with the patient including but were not limited to: bleeding which may require transfusion or reoperation; infection which may require antibiotics; injury to bowel, bladder, ureters or other surrounding organs; injury to the fetus; need for additional procedures including hysterectomy in the event of a life-threatening hemorrhage; placental abnormalities wth subsequent pregnancies, incisional problems, thromboembolic phenomenon and other postoperative/anesthesia complications.  The patient concurred with the proposed plan, giving informed written consent for the procedure.    FINDINGS:  Viable female infant in double footling breech presentation.  Apgars 8 and 9.  Clear amniotic fluid.  Intact placenta, three vessel cord.  Normal uterus, fallopian tubes and ovaries bilaterally.  PROCEDURE IN DETAIL:  The patient preoperatively received intravenous antibiotics and had sequential compression devices applied to her lower extremities.  She was then taken to the  operating room where spinal anesthesia was administered and was found to be adequate. She was then placed in a dorsal supine position with a leftward tilt, and prepped and draped in a sterile manner.  A foley catheter was placed into her bladder and attached to constant gravity.  After an adequate timeout was performed, a Pfannenstiel skin incision was made with scalpel and carried through to the underlying layer of fascia. The fascia was incised in the midline, and this incision was extended bilaterally using the Mayo scissors.  Kocher clamps were applied to the superior aspect of the fascial incision and the underlying rectus muscles were dissected off bluntly. A similar process was carried out on the inferior aspect of the fascial incision. The rectus muscles were separated in the midline bluntly and the peritoneum was entered bluntly. Attention was turned to the lower uterine segment where a low transverse hysterotomy was made with a scalpel and extended bilaterally bluntly.  The infant was successfully delivered, the cord was clamped and cut and the infant was handed over to awaiting neonatology team after delayed cord clampng. Uterine massage was then administered, and the placenta delivered intact with a three-vessel cord. The uterus was then cleared of clot and debris.  The hysterotomy was closed with 0 Vicryl in a running locked fashion, and an imbricating layer was also placed with 0 Vicryl.  The pelvis was cleared of all clot and debris. Hemostasis was confirmed on all surfaces.  The peritoneum and the muscles were reapproximated using 0 Vicryl interrupted stitch. The fascia was then closed using 0 Vicryl in a running fashion.  30 ml of 0.5% Marcaine was injected subcutaneously around the incision.  The skin was closed with a 4-0 Vicryl  subcuticular stitch. The patient tolerated the procedure well. Sponge, lap, instrument and needle counts were correct x 2.  She was taken to the recovery room in stable  condition.   Caren Macadam, MD  Family Medicine, OB Fellow [facility]

## 2015-06-13 NOTE — Anesthesia Postprocedure Evaluation (Signed)
Anesthesia Post Note  Patient: Sharon King  Procedure(s) Performed: Procedure(s) (LRB): CESAREAN SECTION (N/A)  Patient location during evaluation: PACU Anesthesia Type: Spinal Level of consciousness: awake and alert and oriented Pain management: pain level controlled Vital Signs Assessment: post-procedure vital signs reviewed and stable Respiratory status: spontaneous breathing, nonlabored ventilation and respiratory function stable Cardiovascular status: blood pressure returned to baseline and stable Postop Assessment: no signs of nausea or vomiting, no headache, no backache, spinal receding and patient able to bend at knees Anesthetic complications: no    Last Vitals:  Filed Vitals:   06/13/15 2015 06/13/15 2030  BP: 129/84 121/89  Pulse: 102 99  Temp: 36.8 C   Resp: 21 20    Last Pain:  Filed Vitals:   06/13/15 2043  PainSc: 0-No pain                 Corinthian Mizrahi A.

## 2015-06-13 NOTE — Anesthesia Preprocedure Evaluation (Addendum)
Anesthesia Evaluation  Patient identified by MRN, date of birth, ID band Patient awake    Reviewed: Allergy & Precautions, NPO status , Patient's Chart, lab work & pertinent test results  Airway Mallampati: II  TM Distance: >3 FB Neck ROM: Full    Dental no notable dental hx. (+) Teeth Intact   Pulmonary asthma ,    Pulmonary exam normal breath sounds clear to auscultation       Cardiovascular negative cardio ROS Normal cardiovascular exam Rhythm:Regular Rate:Normal     Neuro/Psych  Headaches, Seizures -, Well Controlled,  Last Sz- 2014 negative psych ROS   GI/Hepatic Neg liver ROS, GERD  ,  Endo/Other  Obesity  Renal/GU negative Renal ROS  negative genitourinary   Musculoskeletal negative musculoskeletal ROS (+)   Abdominal   Peds  Hematology  (+) anemia ,   Anesthesia Other Findings   Reproductive/Obstetrics (+) Pregnancy PTL 32 weeks Breech presentation PROM Possible Chorioamnionitis                           Anesthesia Physical Anesthesia Plan  ASA: II  Anesthesia Plan: Spinal   Post-op Pain Management:    Induction:   Airway Management Planned: Natural Airway  Additional Equipment:   Intra-op Plan:   Post-operative Plan:   Informed Consent: I have reviewed the patients History and Physical, chart, labs and discussed the procedure including the risks, benefits and alternatives for the proposed anesthesia with the patient or authorized representative who has indicated his/her understanding and acceptance.   Dental advisory given  Plan Discussed with: CRNA, Anesthesiologist and Surgeon  Anesthesia Plan Comments:         Anesthesia Quick Evaluation

## 2015-06-13 NOTE — Consult Note (Addendum)
Neonatology Note:   Attendance at C-section:    I was asked by Dr. Ernestina Patches to attend this C/S at [redacted] weeks EGA for PPROM (2/25) with PTL and breech presentation. The mother is a G1, GBS negative with latency antibiotics though very recent concerns for development of chorioamnionitis. S/p mag and BTMZ 2/26.  Fluid clear and not malodorous. Infant vigorous with good spontaneous cry and tone. 60 second delayed cord clamping done. Brought to warmer and HR noted to be >100.  Minimal cry with some WOB thus CPAP started.  Placed in warming bag.  Pulse oximetry placed and 30-40% fio2 needed to maintain appropriate Sao2. Unable to wean from CPAP without desaturations. Lungs clear with good air entry.  Good perfusion.  Amniotic band right anterior lower leg (not circumferential).  Ap 8/9.  Admit to NICU for prematurity.    Jerlyn Ly, MD

## 2015-06-14 ENCOUNTER — Encounter (HOSPITAL_COMMUNITY): Payer: Self-pay | Admitting: Obstetrics & Gynecology

## 2015-06-14 LAB — CBC
HEMATOCRIT: 29.1 % — AB (ref 36.0–46.0)
HEMOGLOBIN: 9 g/dL — AB (ref 12.0–15.0)
MCH: 24.9 pg — ABNORMAL LOW (ref 26.0–34.0)
MCHC: 30.9 g/dL (ref 30.0–36.0)
MCV: 80.4 fL (ref 78.0–100.0)
Platelets: 357 10*3/uL (ref 150–400)
RBC: 3.62 MIL/uL — ABNORMAL LOW (ref 3.87–5.11)
RDW: 16.8 % — AB (ref 11.5–15.5)
WBC: 19.9 10*3/uL — AB (ref 4.0–10.5)

## 2015-06-14 MED ORDER — FERROUS SULFATE 325 (65 FE) MG PO TABS
325.0000 mg | ORAL_TABLET | Freq: Every day | ORAL | Status: DC
  Administered 2015-06-14: 325 mg via ORAL
  Filled 2015-06-14: qty 1

## 2015-06-14 MED ORDER — NALBUPHINE HCL 10 MG/ML IJ SOLN: 5.0000 mg | INTRAMUSCULAR | Status: DC | PRN

## 2015-06-14 MED ORDER — DIPHENHYDRAMINE HCL 25 MG PO CAPS: 25.0000 mg | ORAL_CAPSULE | ORAL | Status: DC | PRN

## 2015-06-14 MED ORDER — POLYSACCHARIDE IRON COMPLEX 150 MG PO CAPS
150.0000 mg | ORAL_CAPSULE | Freq: Every day | ORAL | Status: DC
  Administered 2015-06-15: 150 mg via ORAL
  Filled 2015-06-14 (×3): qty 1

## 2015-06-14 MED ORDER — NALOXONE HCL 2 MG/2ML IJ SOSY
1.0000 ug/kg/h | PREFILLED_SYRINGE | INTRAVENOUS | Status: DC | PRN
  Filled 2015-06-14: qty 2

## 2015-06-14 MED ORDER — IBUPROFEN 600 MG PO TABS: 600.0000 mg | ORAL_TABLET | Freq: Four times a day (QID) | ORAL | Status: DC | PRN

## 2015-06-14 MED ORDER — SODIUM CHLORIDE 0.9% FLUSH: 3.0000 mL | INTRAVENOUS | Status: DC | PRN

## 2015-06-14 MED ORDER — ONDANSETRON HCL 4 MG/2ML IJ SOLN: 4.0000 mg | Freq: Three times a day (TID) | INTRAMUSCULAR | Status: DC | PRN

## 2015-06-14 MED ORDER — NALOXONE HCL 0.4 MG/ML IJ SOLN: 0.4000 mg | INTRAMUSCULAR | Status: DC | PRN

## 2015-06-14 MED ORDER — NALBUPHINE HCL 10 MG/ML IJ SOLN: 5.0000 mg | Freq: Once | INTRAMUSCULAR | Status: DC | PRN

## 2015-06-14 MED ORDER — DIPHENHYDRAMINE HCL 50 MG/ML IJ SOLN: 12.5000 mg | INTRAMUSCULAR | Status: DC | PRN

## 2015-06-14 NOTE — Lactation Note (Signed)
This note was copied from a baby's chart. Lactation Consultation Note  Patient Name: Sharon King [id number] Date: 06/14/2015 Reason for consult: Initial assessment;NICU baby  NICU baby 96 hours old, baby [redacted]w[redacted]d GA. Mom reports that she has pumped twice and collected a few drops of colostrum the first time that she pumped, but none the second. Discussed normal progression of milk coming to volume. Enc pumping 8 times/24 hours for 15 minutes followed by hand expression. Mom states that the colostrum she collected is in the refrigerator. Discussed storage guidelines and how to transport milk back to the hospital. Enc mom to take colostrum to NICU as able.   Mom has scar tissue all around both areolas. Mom reports that she had 2 hematomas removed from each breast 10 years ago, but that her surgeon assured her that she should be able to breastfeed. Demonstrated hand express with colostrum visible from right breast. Discussed possible nerve damage and enc mom to call for [facility] if she is having any difficulty removing EBM from breasts. Mom given NICU booklet and [place] brochure with review. Mom aware of OP/BFSG and [place] phone line assistance after D/C.   Mom enc to call insurance company for Levasy and is aware of 2-week rental.   Maternal Data Has patient been taught Hand Expression?: Yes Does the patient have breastfeeding experience prior to this delivery?: No  Feeding    LATCH Score/Interventions                      Lactation Tools Discussed/Used Pump Review: Setup, frequency, and cleaning;Milk Storage Initiated by:: Physiological scientist. Date initiated:: 06/13/15   Consult Status Consult Status: Follow-up Date: 06/15/15 Follow-up type: In-patient    [name] 06/14/2015, 9:59 AM

## 2015-06-14 NOTE — Anesthesia Postprocedure Evaluation (Signed)
Anesthesia Post Note  Patient: Kalyah Sagona  Procedure(s) Performed: Procedure(s) (LRB): CESAREAN SECTION (N/A)  Patient location during evaluation: Women's Unit Anesthesia Type: General Level of consciousness: awake Pain management: satisfactory to patient (5;received meds) Vital Signs Assessment: post-procedure vital signs reviewed and stable Respiratory status: spontaneous breathing Cardiovascular status: stable Postop Assessment: no headache, no backache and patient able to bend at knees (ambulating independently) Anesthetic complications: no    Last Vitals:  Filed Vitals:   06/14/15 0131 06/14/15 0521  BP: 112/68 103/71  Pulse: 86 83  Temp: 36.7 C 36.7 C  Resp: 17 16    Last Pain:  Filed Vitals:   06/14/15 0618  PainSc: 3                  Everette Rank

## 2015-06-14 NOTE — Addendum Note (Signed)
Addendum  created 06/14/15 0847 by Georgeanne Nim, CRNA   Modules edited: Clinical Notes   Clinical Notes:  File: [id number]

## 2015-06-14 NOTE — Progress Notes (Signed)
POSTPARTUM PROGRESS NOTE  Post Partum Day 1 Subjective:  Sharon King is a 30 y.o. G1P0100 [redacted]w[redacted]d s/p pltcs 2/2 triple I with pprom.  No acute events overnight.  Pt denies problems with ambulating, voiding or po intake.  She denies nausea or vomiting.  Pain is moderately controlled.  She has had flatus. She has not had bowel movement.  Lochia Small.   Objective: Blood pressure 103/71, pulse 83, temperature 98.1 F (36.7 C), temperature source Oral, resp. rate 16, height 5\' 3"  (1.6 m), weight 190 lb (86.183 kg), last menstrual period 10/09/2014, SpO2 100 %, unknown if currently breastfeeding.  Physical Exam:  General: alert, cooperative and no distress Lochia:normal flow Chest: CTAB Heart: RRR no m/r/g Abdomen: +BS, soft, nontender, incision c/d/i Uterine Fundus: firm,  DVT Evaluation: No calf swelling or tenderness Extremities: trace edema   Recent Labs  06/12/15 0702 06/14/15 0456  HGB 8.4* 9.0*  HCT 27.9* 29.1*    Assessment/Plan:  ASSESSMENT: Sharon King is a 30 y.o. G1P0100 [redacted]w[redacted]d s/p pltcs, doing well. H actually improved but given starting H of 8.4 will start feso4. No signs endometritis, not on antibiotics. Plan for d/c pod 3 as baby in nicu    LOS: 6 days   Desma Maxim 06/14/2015, 8:46 AM

## 2015-06-14 NOTE — Clinical Social Work Maternal (Signed)
CLINICAL SOCIAL WORK MATERNAL/CHILD NOTE  Patient Details  Name: Sharon King MRN: [id number] Date of Birth: Mar 11, 1986  Date:  06/14/2015  Clinical Social Worker Initiating Note:  Olivya Sobol E. Brigitte Pulse, [place] Date/ Time Initiated:  06/14/15/1130     Child's Name:  Sharon King   Legal Guardian:   (Parents: Estill Bamberg and Leonie Man)   Need for Interpreter:  None   Date of Referral:        Reason for Referral:   (No referral-NICU admission)   Referral Source:      Address:  [address] [place], [place] [address]  Phone number:  [phone]   Household Members:  Spouse   Natural Supports (not living in the home):  Immediate Family, Extended Family (Parents reports having a good support system.  FOB states his mother is currently visiting from [facility].  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  MOB's facesheet notes religion as [place].  Strengths:  Ability to meet basic needs , Compliance with medical plan , Understanding of illness, Home prepared for child , Pediatrician chosen  (Pediatric follow up will be with Dr. Rainey Pines at [facility].)   Risk Factors/Current Problems:  None   Cognitive State:  Alert , Linear Thinking , Goal Oriented , Insightful    Mood/Affect:  Interested , Comfortable , Relaxed    CSW Assessment: CSW met with parents in MOB's third floor room/303 to introduce services, offer support, and complete assessment due to baby's admission to NICU at 32.1 weeks.  Parents were friendly and welcoming.  FOB seemed more talkative than MOB, but both were easy to  engage. Parents spoke about their birth story and how they felt prepared for baby's premature delivery due to MOB's water breaking on Saturday.  She reports she was admitted at that time and that her contractions and pain could not be controlled yesterday, leading to delivery.  Parents feel they are coping well and are thankful for how healthy baby is despite his prematurity.  They state no emotional concerns at this time and feel they have been well informed. Parents report that they have a good support system and everything they need for baby at home.  They state awareness of SIDS and report that baby will sleep in his own environment.  MOB states that their house is packed up except for bare necessities and all of their baby supplies so that they can arrange the nursery as soon as they move into their new home on 3/22.  Their new address will be [address] [place], [place] [address].  They both seem very excited about their first purchased home. CSW provided education regarding signs and symptoms of PPD as well as common emotions often experienced in the first few weeks following delivery.  CSW encouraged them to allow themselves to be emotional as they transition to parenthood and cope with baby's premature birth/NICU admission.  MOB became somewhat tearful and states she has been crying "happy tears."  Parents seem very supportive of each other. CSW explained  ongoing support services offered by NICU CSW and gave contact information.  Parents seemed appreciative of CSW's visit and concern for their emotional wellbeing.  CSW has no social concerns at this time.     CSW Plan/Description:  Patient/Family Education , Psychosocial Support and Ongoing Assessment of Needs    Kalman Shan 06/14/2015, 3:34 PM

## 2015-06-14 NOTE — Lactation Note (Signed)
This note was copied from a baby's chart. Lactation Consultation Note  Patient Name: Boy Dajah Bazzle [id number] Date: 06/14/2015  Baby 1 hours old. Called to meet mom at baby's bedside in NICU, but was a misunderstanding. When this Progress entered mom's room on Women's unit, MOB states that she needed assistance with pumping/hand expression because she was sleepy earlier and not clear about assistance given earlier by this [place]. Mom able to return-demonstrated hand expression with colostrum present from both breast. MOB's bedside nurse enc mom to call for her assistance as needed.   Maternal Data    Feeding    [facility] Score/Interventions                      Lactation Tools Discussed/Used     Consult Status      [name] 06/14/2015, 4:17 PM

## 2015-06-15 DIAGNOSIS — O321XX0 Maternal care for breech presentation, not applicable or unspecified: Secondary | ICD-10-CM | POA: Diagnosis present

## 2015-06-15 LAB — RPR: RPR Ser Ql: NONREACTIVE

## 2015-06-15 MED ORDER — OXYCODONE-ACETAMINOPHEN 5-325 MG PO TABS: 1.0000 | ORAL_TABLET | ORAL | Status: DC | PRN

## 2015-06-15 NOTE — Lactation Note (Signed)
This note was copied from a baby's chart. Lactation Consultation Note  Patient Name: Sharon King [id number] Date: 06/15/2015 Reason for consult: Follow-up assessment;NICU baby;Infant < 6lbs 2 week pump rental completed for Mom. Mom is pumping every 3 hours for 15 minutes. She was getting some colostrum yesterday but not much today. LC encouraged Mom to continue her pumping 15-30 minutes every 3 hours to encourage milk production. Mom reports aware of breast milk storage guidelines, advised to refer to NICU booklet. Mom denies any discomfort with pumping. Encouraged to call for questions/concerns.   Maternal Data    Feeding Feeding Type: Formula Length of feed: 10 min  LATCH Score/Interventions                      Lactation Tools Discussed/Used Tools: Pump Breast pump type: Double-Electric Breast Pump   Consult Status Consult Status: Complete Date: 06/15/15 Follow-up type: In-patient    Katrine Coho 06/15/2015, 3:37 PM

## 2015-06-15 NOTE — Discharge Instructions (Signed)
Postpartum Depression and Baby Blues °The postpartum period begins right after the birth of a baby. During this time, there is often a great amount of joy and excitement. It is also a time of many changes in the life of the parents. Regardless of how many times a mother gives birth, each child brings new challenges and dynamics to the family. It is not unusual to have feelings of excitement along with confusing shifts in moods, emotions, and thoughts. All mothers are at risk of developing postpartum depression or the "baby blues." These mood changes can occur right after giving birth, or they may occur many months after giving birth. The baby blues or postpartum depression can be mild or severe. Additionally, postpartum depression can go away rather quickly, or it can be a long-term condition.  °CAUSES °Raised hormone levels and the rapid drop in those levels are thought to be a main cause of postpartum depression and the baby blues. A number of hormones change during and after pregnancy. Estrogen and progesterone usually decrease right after the delivery of your baby. The levels of thyroid hormone and various cortisol steroids also rapidly drop. Other factors that play a role in these mood changes include major life events and genetics.  °RISK FACTORS °If you have any of the following risks for the baby blues or postpartum depression, know what symptoms to watch out for during the postpartum period. Risk factors that may increase the likelihood of getting the baby blues or postpartum depression include: °· Having a personal or family history of depression.   °· Having depression while being pregnant.   °· Having premenstrual mood issues or mood issues related to oral contraceptives. °· Having a lot of life stress.   °· Having marital conflict.   °· Lacking a social support network.   °· Having a baby with special needs.   °· Having health problems, such as diabetes.   °SIGNS AND SYMPTOMS °Symptoms of baby blues  include: °· Brief changes in mood, such as going from extreme happiness to sadness. °· Decreased concentration.   °· Difficulty sleeping.   °· Crying spells, tearfulness.   °· Irritability.   °· Anxiety.   °Symptoms of postpartum depression typically begin within the first month after giving birth. These symptoms include: °· Difficulty sleeping or excessive sleepiness.   °· Marked weight loss.   °· Agitation.   °· Feelings of worthlessness.   °· Lack of interest in activity or food.   °Postpartum psychosis is a very serious condition and can be dangerous. Fortunately, it is rare. Displaying any of the following symptoms is cause for immediate medical attention. Symptoms of postpartum psychosis include:  °· Hallucinations and delusions.   °· Bizarre or disorganized behavior.   °· Confusion or disorientation.   °DIAGNOSIS  °A diagnosis is made by an evaluation of your symptoms. There are no medical or lab tests that lead to a diagnosis, but there are various questionnaires that a health care provider may use to identify those with the baby blues, postpartum depression, or psychosis. Often, a screening tool called the Edinburgh Postnatal Depression Scale is used to diagnose depression in the postpartum period.  °TREATMENT °The baby blues usually goes away on its own in 1-2 weeks. Social support is often all that is needed. You will be encouraged to get adequate sleep and rest. Occasionally, you may be given medicines to help you sleep.  °Postpartum depression requires treatment because it can last several months or longer if it is not treated. Treatment may include individual or group therapy, medicine, or both to address any social, physiological, and psychological   factors that may play a role in the depression. Regular exercise, a healthy diet, rest, and social support may also be strongly recommended.  Postpartum psychosis is more serious and needs treatment right away. Hospitalization is often needed. HOME CARE  INSTRUCTIONS  Get as much rest as you can. Nap when the baby sleeps.   Exercise regularly. Some women find yoga and walking to be beneficial.   Eat a balanced and nourishing diet.   Do little things that you enjoy. Have a cup of tea, take a bubble bath, read your favorite magazine, or listen to your favorite music.  Avoid alcohol.   Ask for help with household chores, cooking, grocery shopping, or running errands as needed. Do not try to do everything.   Talk to people close to you about how you are feeling. Get support from your partner, family members, friends, or other new moms.  Try to stay positive in how you think. Think about the things you are grateful for.   Do not spend a lot of time alone.   Only take over-the-counter or prescription medicine as directed by your health care provider.  Keep all your postpartum appointments.   Let your health care provider know if you have any concerns.  SEEK MEDICAL CARE IF: You are having a reaction to or problems with your medicine. SEEK IMMEDIATE MEDICAL CARE IF:  You have suicidal feelings.   You think you may harm the baby or someone else. MAKE SURE YOU:  Understand these instructions.  Will watch your condition.  Will get help right away if you are not doing well or get worse.   This information is not intended to replace advice given to you by your health care provider. Make sure you discuss any questions you have with your health care provider.   Document Released: 01/02/2004 Document Revised: 04/04/2013 Document Reviewed: 01/09/2013 Elsevier Interactive Patient Education 2016 Else[employer] Cesarean Delivery, Care After Refer to this sheet in the next few weeks. These instructions provide you with information on caring for yourself after your procedure. Your health care provider may also give you specific instructions. Your treatment has been planned according to current medical practices, but problems  sometimes occur. Call your health care provider if you have any problems or questions after you go home. HOME CARE INSTRUCTIONS  Only take over-the-counter or prescription medications as directed by your health care provider.  Do not drink alcohol, especially if you are breastfeeding or taking medication to relieve pain.  Do not chew or smoke tobacco.  Continue to use good perineal care. Good perineal care includes:  Wiping your perineum from front to back.  Keeping your perineum clean.  Check your surgical cut (incision) daily for increased redness, drainage, swelling, or separation of skin.  Clean your incision gently with soap and water every day, and then pat it dry. If your health care provider says it is okay, leave the incision uncovered. Use a bandage (dressing) if the incision is draining fluid or appears irritated. If the adhesive strips across the incision do not fall off within 7 days, carefully peel them off.  Hug a pillow when coughing or sneezing until your incision is healed. This helps to relieve pain.  Do not use tampons or douche until your health care provider says it is okay.  Shower, wash your hair, and take tub baths as directed by your health care provider.  Wear a well-fitting bra that provides breast support.  Limit wearing support panties or  control-top hose.  Drink enough fluids to keep your urine clear or pale yellow.  Eat high-fiber foods such as whole grain cereals and breads, brown rice, beans, and fresh fruits and vegetables every day. These foods may help prevent or relieve constipation.  Resume activities such as climbing stairs, driving, lifting, exercising, or traveling as directed by your health care provider.  Talk to your health care provider about resuming sexual activities. This is dependent upon your risk of infection, your rate of healing, and your comfort and desire to resume sexual activity.  Try to have someone help you with your  household activities and your newborn for at least a few days after you leave the hospital.  Rest as much as possible. Try to rest or take a nap when your newborn is sleeping.  Increase your activities gradually.  Keep all of your scheduled postpartum appointments. It is very important to keep your scheduled follow-up appointments. At these appointments, your health care provider will be checking to make sure that you are healing physically and emotionally. SEEK MEDICAL CARE IF:   You are passing large clots from your vagina. Save any clots to show your health care provider.  You have a foul smelling discharge from your vagina.  You have trouble urinating.  You are urinating frequently.  You have pain when you urinate.  You have a change in your bowel movements.  You have increasing redness, pain, or swelling near your incision.  You have pus draining from your incision.  Your incision is separating.  You have painful, hard, or reddened breasts.  You have a severe headache.  You have blurred vision or see spots.  You feel sad or depressed.  You have thoughts of hurting yourself or your newborn.  You have questions about your care, the care of your newborn, or medications.  You are dizzy or light-headed.  You have a rash.  You have pain, redness, or swelling at the site of the removed intravenous access (IV) tube.  You have nausea or vomiting.  You stopped breastfeeding and have not had a menstrual period within 12 weeks of stopping.  You are not breastfeeding and have not had a menstrual period within 12 weeks of delivery.  You have a fever. SEEK IMMEDIATE MEDICAL CARE IF:  You have persistent pain.  You have chest pain.  You have shortness of breath.  You faint.  You have leg pain.  You have stomach pain.  Your vaginal bleeding saturates 2 or more sanitary pads in 1 hour. MAKE SURE YOU:   Understand these instructions.  Will watch your  condition.  Will get help right away if you are not doing well or get worse.   This information is not intended to replace advice given to you by your health care provider. Make sure you discuss any questions you have with your health care provider.   Document Released: 12/20/2001 Document Revised: 04/20/2014 Document Reviewed: 11/25/2011 Elsevier Interactive Patient Education [employer].

## 2015-06-15 NOTE — Discharge Summary (Signed)
OB Discharge Summary  Patient Name: Sharon King DOB: 20-Jan-1986 MRN: [id number]  Date of admission: 06/08/2015 Delivering MD: Lavonia Drafts   Date of discharge: 06/15/2015  Admitting diagnosis: 44 WKS, WATER BROKE Intrauterine pregnancy: [redacted]w[redacted]d     Secondary diagnosis:Principal Problem:   Preterm premature rupture of membranes (PPROM) with unknown onset of labor Active Problems:   Breech presentation delivered   Postpartum care following cesarean delivery   Acute blood loss anemia  Additional problems:Seizure disorder Migraine headache Asthma, mild intermittent Allergic rhinitis  Amniotic band  Discharge diagnosis: Preterm Pregnancy Delivered and see above                                                                     Post partum procedures:none  Augmentation: N/A  Complications: Intrauterine Inflammation or infection (Chorioamniotis) and ROM>24 hours  Hospital course:  Induction of Labor With Cesarean Section  30 y.o. yo G1P0100 at [redacted]w[redacted]d was admitted to the hospital 06/08/2015 for PPROM. Patient received BMZ and latency antibiotics.. The patient went for cesarean section due to Malpresentation and probable onset of labor with possible choroamnionitis, and delivered a Viable female infant. Membrane Rupture Time/Date:8:00 PM ,06/08/2015  Details of operation can be found in separate operative Note.  Patient had an uncomplicated postpartum course. She is ambulating, tolerating a regular diet, passing flatus, and urinating well.  Patient is discharged home in stable condition on 06/15/2015.                                     Physical exam  Filed Vitals:   06/14/15 1000 06/14/15 1400 06/15/15 0139 06/15/15 0619  BP: 125/63 123/71 107/63 114/71  Pulse: 94 97 98 115  Temp: 97.9 F (36.6 C) 98.2 F (36.8 C) 98.3 F (36.8 C) 98.3 F (36.8 C)  TempSrc: Oral Oral Oral Oral  Resp: 16 18 18 18   Height:      Weight:      SpO2: 98% 100% 100% 98%    General: alert and no distress Lochia: appropriate Uterine Fundus: firm and U-2 Incision: Dressing is clean, dry, and intact DVT Evaluation: No evidence of DVT seen on physical exam. Labs: Lab Results  Component Value Date   WBC 19.9* 06/14/2015   HGB 9.0* 06/14/2015   HCT 29.1* 06/14/2015   MCV 80.4 06/14/2015   PLT 357 06/14/2015   CMP Latest Ref Rng 10/24/2013  Glucose 65-99 mg/dL 106(H)  BUN 7-18 mg/dL 9  Creatinine 0.60-1.30 mg/dL 0.63  Sodium 136-145 mmol/L 140  Potassium 3.5-5.1 mmol/L 3.5  Chloride 98-107 mmol/L 109(H)  CO2 21-32 mmol/L 24  Calcium 8.5-10.1 mg/dL 8.8  Total Protein 6.4-8.2 g/dL 7.2  Total Bilirubin 0.2-1.0 mg/dL 0.4  Alkaline Phos - 57  AST 15-37 Unit/L 8(L)  ALT 12-78 U/L 10(L)    Discharge instruction: per After Visit Summary and "Baby and Me Booklet".  After Visit Meds:    Medication List    TAKE these medications        diphenhydrAMINE 25 MG tablet  Commonly known as:  BENADRYL  Take 50 mg by mouth at bedtime as needed for sleep.  Melatonin 3 MG Caps  Take 3 mg by mouth at bedtime as needed (for sleep).     oxyCODONE-acetaminophen 5-325 MG tablet  Commonly known as:  PERCOCET/ROXICET  Take 1-2 tablets by mouth every 4 (four) hours as needed (pain scale 4-7).     prenatal multivitamin Tabs tablet  Take 1 tablet by mouth daily.     promethazine 12.5 MG tablet  Commonly known as:  PHENERGAN  Take 12.5 mg by mouth every 6 (six) hours as needed for nausea or vomiting.        Diet: routine diet  Activity: Advance as tolerated. Pelvic rest for 6 weeks.   Outpatient follow up:6 weeks  Postpartum contraception: IUD Mirena  Newborn Data: Live born female  Birth Weight: 4 lb 5.5 oz (1970 g) APGAR: 8, 9  Baby Feeding: Breast   Disposition:NICU   06/15/2015 Donnamae Jude, MD

## 2015-06-15 NOTE — Progress Notes (Signed)

## 2015-06-19 ENCOUNTER — Ambulatory Visit: Payer: Self-pay

## 2015-06-19 NOTE — Lactation Note (Signed)
This note was copied from a baby's chart. Lactation Consultation Note  Follow up visit made in NICU.  [name] is now 67 days old.  Mom is pumping inconsistently and obtaining very small amounts.  Stressed importance of pumping every 3 hours to establish and maintain her milk supply.  Mom states she power pumped this AM  Encouraged to call with concerns prn.  Patient Name: Sharon King [id number] Date: 06/19/2015     Maternal Data    Feeding    LATCH Score/Interventions                      Lactation Tools Discussed/Used     Consult Status      Ave Filter 06/19/2015, 2:17 PM

## 2015-06-20 ENCOUNTER — Ambulatory Visit: Payer: Self-pay

## 2015-06-20 NOTE — Lactation Note (Signed)
This note was copied from a baby's chart. Lactation Consultation Note  Patient Name: Sharon King [id number] Date: 06/20/2015 Reason for consult: Follow-up assessment   With this mom and baby, in NICU. [name] is now 14 days old and 33 1/7 weeks CGA. Katie, RN, reports that baby has been cuing before feeds, and fed some from bottle this morning. She asked that I try him at the breast, to nuzzle with ng feeding. He was able to flange his lips widely, and could easily fit mom's nipple in his mouth, but he aw asleep once in mom's arms, no sucking noted, so mom just held him diring ng feeding. Mom aware he is small and early. Mom has a low milk supply. I encouraged her to pump every 3 hours, followed by hand expression, and to pump each time until she stops dripping. Mom knows to call for questions/concerns.    Maternal Data    Feeding Feeding Type: Breast Milk with Formula added Length of feed: 90 min  LATCH Score/Interventions Latch: Too sleepy or reluctant, no latch achieved, no sucking elicited. Intervention(s): Skin to skin  Audible Swallowing: None  Type of Nipple: Everted at rest and after stimulation  Comfort (Breast/Nipple): Soft / non-tender     Hold (Positioning): Assistance needed to correctly position infant at breast and maintain latch. Intervention(s): Skin to skin;Breastfeeding basics reviewed;Support Pillows;Position options  LATCH Score: 5  Lactation Tools Discussed/Used     Consult Status Consult Status: PRN Follow-up type: In-patient (NICU)    Tonna Corner 06/20/2015, 3:07 PM

## 2015-06-23 ENCOUNTER — Ambulatory Visit: Payer: Self-pay

## 2015-06-23 NOTE — Lactation Note (Signed)
This note was copied from a baby's chart. Lactation Consultation Note  Patient Name: Sharon King [id number] Date: 06/23/2015 Reason for consult: Follow-up assessment Baby at 10 days of life and mom had questions about latching and pumping more milk. She is using the hospital grade Medela DEBP every 2-3 hr around the clock. She started fenugreek 2 days ago, 3 pills TID. She has the Mother's milk tea that she plans to start taking. Suggested she use hands on pumping, moist heat, change the suction strength, use the let down button, and f/u with manual expression. She has been able to latch baby 2 time since birth. She declined latch help today but would like to try tomorrow. Answered questions about NS. She is aware of OP services and will call as needed.    Maternal Data    Feeding Feeding Type: Formula Length of feed: 90 min  LATCH Score/Interventions                      Lactation Tools Discussed/Used     Consult Status Consult Status: PRN    Denzil Hughes 06/23/2015, 5:59 PM

## 2015-06-25 ENCOUNTER — Ambulatory Visit (INDEPENDENT_AMBULATORY_CARE_PROVIDER_SITE_OTHER): Payer: 59 | Admitting: Family Medicine

## 2015-06-25 ENCOUNTER — Encounter: Payer: Self-pay | Admitting: Family Medicine

## 2015-06-25 VITALS — BP 118/78 | HR 78 | Resp 18 | Wt 175.0 lb

## 2015-06-25 DIAGNOSIS — Z4889 Encounter for other specified surgical aftercare: Secondary | ICD-10-CM

## 2015-06-25 DIAGNOSIS — O9270 Unspecified disorders of lactation: Secondary | ICD-10-CM

## 2015-06-25 MED ORDER — METOCLOPRAMIDE HCL 10 MG PO TABS: 10.0000 mg | ORAL_TABLET | Freq: Four times a day (QID) | ORAL | Status: DC | PRN

## 2015-06-25 NOTE — Progress Notes (Signed)
    Subjective:    Patient ID: Sharon King is a 30 y.o. female presenting with Wound Check  on 06/25/2015  HPI: Here for incision check. Had C-section for breech at 32 wks with PTL. She is nursing. Baby boy remains in NICU. She would like Reglan for milk production.  She has tried mother's milk tea and fenugreek with little success.  Review of Systems  Constitutional: Negative for fever and chills.  Respiratory: Negative for shortness of breath.   Cardiovascular: Negative for chest pain.  Gastrointestinal: Positive for abdominal pain (incisional pain).  Genitourinary: Negative for pelvic pain.  Skin: Negative for rash.      Objective:    BP 118/78 mmHg  Pulse 78  Resp 18  Wt 175 lb (79.379 kg)  LMP 10/09/2014  Breastfeeding? Yes Physical Exam  Constitutional: She is oriented to person, place, and time. She appears well-developed and well-nourished.  Pulmonary/Chest: Effort normal.  Abdominal: Soft. There is no tenderness. There is no guarding.  Musculoskeletal: She exhibits no edema.  Neurological: She is alert and oriented to person, place, and time.  Skin: Skin is warm and dry.  Steri-strips in place and incision is without erythema        Assessment & Plan:  1. Impaired milk production during nursing Trial of Reglan - metoCLOPramide (REGLAN) 10 MG tablet; Take 1 tablet (10 mg total) by mouth every 6 (six) hours as needed for nausea.  Dispense: 120 tablet; Refill: 1  2. Encounter for postoperative wound check Wound looks good   Return in about 4 weeks (around 07/23/2015) for pp check.  [name] S 06/25/2015 3:00 PM

## 2015-06-25 NOTE — Patient Instructions (Signed)
Breast Pumping Tips °If you are breastfeeding, there may be times when you cannot feed your baby directly. Returning to work or going on a trip are common examples. Pumping allows you to store breast milk and feed it to your baby later.  °You may not get much milk when you first start to pump. Your breasts should start to make more after a few days. If you pump at the times you usually feed your baby, you may be able to keep making enough milk to feed your baby without also using formula. The more often you pump, the more milk you will produce.  °WHEN SHOULD I PUMP?  °· You can begin to pump soon after delivery. However, some experts recommend waiting about 4 weeks before giving your infant a bottle to make sure breastfeeding is going well.  °· If you plan to return to work, begin pumping a few weeks before. This will help you develop techniques that work best for you. It also lets you build up a supply of breast milk.   °· When you are with your infant, feed on demand and pump after each feeding.   °· When you are away from your infant for several hours, pump for about 15 minutes every 2-3 hours. Pump both breasts at the same time if you can.   °· If your infant has a formula feeding, make sure to pump around the same time.     °· If you drink any alcohol, wait 2 hours before pumping.   °HOW DO I PREPARE TO PUMP? °Your let-down reflex is the natural reaction to stimulation that makes your breast milk flow. It is easier to stimulate this reflex when you are relaxed. Find relaxation techniques that work for you. If you have difficulty with your let-down reflex, try these methods:  °· Smell one of your infant's blankets or an item of clothing.   °· Look at a picture or video of your infant.   °· Sit in a quiet, private space.   °· Massage the breast you plan to pump.   °· Place soothing warmth on the breast.   °· Play relaxing music.   °WHAT ARE SOME GENERAL BREAST PUMPING TIPS? °· Wash your hands before you pump. You  do not need to wash your nipples or breasts. °· There are three ways to pump. °¨ You can use your hand to massage and compress your breast. °¨ You can use a handheld manual pump. °¨ You can use an electric pump.   °· Make sure the suction cup (flange) on the breast pump is the right size. Place the flange directly over the nipple. If it is the wrong size or placed the wrong way, it may be painful and cause nipple damage.   °· If pumping is uncomfortable, apply a small amount of purified or modified lanolin to your nipple and areola. °· If you are using an electric pump, adjust the speed and suction power to be more comfortable. °· If pumping is painful or if you are not getting very much milk, you may need a different type of pump. A lactation consultant can help you determine what type of pump to use.   °· Keep a full water bottle near you at all times. Drinking lots of fluid helps you make more milk.  °· You can store your milk to use later. Pumped breast milk can be stored in a sealable, sterile container or plastic bag. Label all stored breast milk with the date you pumped it. °¨ Milk can stay out at room temperature for up to 8 hours. °¨   You can store your milk in the refrigerator for up to 8 days. °¨ You can store your milk in the freezer for 3 months. Thaw frozen milk using warm water. Do not put it in the microwave. °· Do not smoke. Smoking can lower your milk supply and harm your infant. If you need help quitting, ask your health care provider to recommend a program.   °WHEN SHOULD I CALL MY HEALTH CARE PROVIDER OR A LACTATION CONSULTANT? °· You are having trouble pumping. °· You are concerned that you are not making enough milk. °· You have nipple pain, soreness, or redness. °· You want to use birth control. Birth control pills may lower your milk supply. Talk to your health care provider about your options. °  °This information is not intended to replace advice given to you by your health care provider.  Make sure you discuss any questions you have with your health care provider. °  °Document Released: 09/17/2009 Document Revised: 04/04/2013 Document Reviewed: 01/20/2013 °Elsevier Interactive Patient Education ©2016 Elsevier Inc[employer] ° °

## 2015-06-25 NOTE — Progress Notes (Signed)
Pt delivered 06-13-15 via C/S due to SROM.  States incision is healing well.  Currently pumping and giving breastmilk by bottle, baby currently in NICU.

## 2015-06-28 ENCOUNTER — Ambulatory Visit (HOSPITAL_COMMUNITY): Payer: 59

## 2015-07-01 ENCOUNTER — Telehealth: Payer: Self-pay | Admitting: *Deleted

## 2015-07-01 NOTE — Telephone Encounter (Signed)
-----   Message from Francia Greaves sent at 07/01/2015  3:46 PM EDT ----- Regarding: Pain Management Contact: [phone] Wants to know what else she can do for pain management, states ibuprofen is tearing up her stomach

## 2015-07-01 NOTE — Telephone Encounter (Signed)
Pt had C/S on 06-13-15, called stating she is still experiencing a lot of discomfort particularly on one side of the incision.  Has been alternating Tylenol and Ibuprofen and is not helping much and the Ibuprofen is starting to upset her stomach.  Informed pt that at this point she should be having minimal pain and would need to come in for an incision check to make sure there is no infection, hematoma, etc. Pt will come in for incision check.

## 2015-07-02 ENCOUNTER — Encounter: Payer: Self-pay | Admitting: Obstetrics & Gynecology

## 2015-07-02 ENCOUNTER — Ambulatory Visit (INDEPENDENT_AMBULATORY_CARE_PROVIDER_SITE_OTHER): Payer: 59 | Admitting: Obstetrics & Gynecology

## 2015-07-02 VITALS — BP 111/78 | HR 75 | Resp 16 | Ht 63.0 in | Wt 175.0 lb

## 2015-07-02 DIAGNOSIS — Z9889 Other specified postprocedural states: Secondary | ICD-10-CM

## 2015-07-02 DIAGNOSIS — R208 Other disturbances of skin sensation: Secondary | ICD-10-CM

## 2015-07-02 DIAGNOSIS — L7682 Other postprocedural complications of skin and subcutaneous tissue: Secondary | ICD-10-CM

## 2015-07-02 LAB — CBC
HCT: 35.4 % — ABNORMAL LOW (ref 36.0–46.0)
Hemoglobin: 10.8 g/dL — ABNORMAL LOW (ref 12.0–15.0)
MCH: 24.6 pg — AB (ref 26.0–34.0)
MCHC: 30.5 g/dL (ref 30.0–36.0)
MCV: 80.6 fL (ref 78.0–100.0)
MPV: 9.7 fL (ref 8.6–12.4)
Platelets: 595 10*3/uL — ABNORMAL HIGH (ref 150–400)
RBC: 4.39 MIL/uL (ref 3.87–5.11)
RDW: 18.9 % — ABNORMAL HIGH (ref 11.5–15.5)
WBC: 8.2 10*3/uL (ref 4.0–10.5)

## 2015-07-02 MED ORDER — OXYCODONE-ACETAMINOPHEN 5-325 MG PO TABS: 1.0000 | ORAL_TABLET | ORAL | Status: DC | PRN

## 2015-07-02 NOTE — Progress Notes (Signed)
   Subjective:    Patient ID: Sharon King, female    DOB: [birth year], 30 y.o.   MRN: [id number]  HPI 30 yo MW P1 here today 3 weeks after a PLTCS for breech and PROM at 29 weeks. She is complaining of incisional pain, right side worse than left. She has been using IBU 600 mg and tylenol and says that the IBU is hurting her stomach. She had sex about a week ago and used a condom. She is in the process of moving and thinks that she has overdone it. She reports normal bowel and bladder function. She denies baby blues. Baby is in the NICU doing well.   Review of Systems     Objective:   Physical Exam  [id number] Breathing, conversing, and ambulating well Incision- healed very nicely, no evidence of infection, seroma, etc Uterus- firm at U-4      Assessment & Plan:  Incisional pain - check CBC Percocet #20 with no refills D/C tylenol Decrease IBU to 200-400 mg prn Keep pp visit appt/rtc prn sooner

## 2015-07-10 ENCOUNTER — Encounter: Payer: 59 | Admitting: Obstetrics & Gynecology

## 2015-07-23 ENCOUNTER — Encounter: Payer: Self-pay | Admitting: Family Medicine

## 2015-07-23 ENCOUNTER — Ambulatory Visit (INDEPENDENT_AMBULATORY_CARE_PROVIDER_SITE_OTHER): Payer: 59 | Admitting: Family Medicine

## 2015-07-23 VITALS — BP 108/70 | HR 107 | Resp 18 | Ht 64.0 in | Wt 177.0 lb

## 2015-07-23 DIAGNOSIS — Z30011 Encounter for initial prescription of contraceptive pills: Secondary | ICD-10-CM

## 2015-07-23 MED ORDER — DROSPIRENONE-ETHINYL ESTRADIOL 3-0.02 MG PO TABS: 1.0000 | ORAL_TABLET | Freq: Every day | ORAL | Status: DC

## 2015-07-23 NOTE — Progress Notes (Signed)
  Subjective:     Sharon King is a 30 y.o. female who presents for a postpartum visit. She is 6 weeks postpartum following a low cervical transverse Cesarean section. I have fully reviewed the prenatal and intrapartum course. The delivery was at 40 gestational weeks. Outcome: primary cesarean section, low transverse incision. Anesthesia: spinal. Postpartum course has been notable for pain, improved now. Baby's course has been notable for NICU stay - going home tomorrow. Baby is feeding by bottle - [place]. Bleeding no bleeding. Bowel function is normal. Bladder function is normal. Patient is not sexually active. Contraception method is none. Postpartum depression screening: negative.  The following portions of the patient's history were reviewed and updated as appropriate: allergies, current medications, past family history, past medical history, past social history, past surgical history and problem list.  Review of Systems Pertinent items noted in HPI and remainder of comprehensive ROS otherwise negative.   Objective:    BP 108/70 mmHg  Pulse 107  Resp 18  Ht 5\' 4"  (1.626 m)  Wt 177 lb (80.287 kg)  BMI 30.37 kg/m2  LMP 07/23/2015  General:  alert, cooperative and appears stated age  Lungs: normal effort  Heart:  regular rate and rhythm  Abdomen: soft, non-tender; bowel sounds normal; no masses,  no organomegaly and incision is clean and dry        Assessment:     Normal postpartum exam. Pap smear not done at today's visit.   Plan:    1. Contraception: OCP (estrogen/progesterone) 2. Pap due 2018 3. Follow up in: 1 year or as needed.

## 2015-07-23 NOTE — Patient Instructions (Signed)
Oral Contraception Use Oral contraceptive pills (OCPs) are medicines taken to prevent pregnancy. OCPs work by preventing the ovaries from releasing eggs. The hormones in OCPs also cause the cervical mucus to thicken, preventing the sperm from entering the uterus. The hormones also cause the uterine lining to become thin, not allowing a fertilized egg to attach to the inside of the uterus. OCPs are highly effective when taken exactly as prescribed. However, OCPs do not prevent sexually transmitted diseases (STDs). Safe sex practices, such as using condoms along with an OCP, can help prevent STDs. Before taking OCPs, you may have a physical exam and Pap test. Your health care provider may also order blood tests if necessary. Your health care provider will make sure you are a good candidate for oral contraception. Discuss with your health care provider the possible side effects of the OCP you may be prescribed. When starting an OCP, it can take 2 to 3 months for the body to adjust to the changes in hormone levels in your body.  HOW TO TAKE ORAL CONTRACEPTIVE PILLS Your health care provider may advise you on how to start taking the first cycle of OCPs. Otherwise, you can:   Start on day 1 of your menstrual period. You will not need any backup contraceptive protection with this start time.   Start on the first Sunday after your menstrual period or the day you get your prescription. In these cases, you will need to use backup contraceptive protection for the first week.   Start the pill at any time of your cycle. If you take the pill within 5 days of the start of your period, you are protected against pregnancy right away. In this case, you will not need a backup form of birth control. If you start at any other time of your menstrual cycle, you will need to use another form of birth control for 7 days. If your OCP is the type called a minipill, it will protect you from pregnancy after taking it for 2 days (48  hours). After you have started taking OCPs:   If you forget to take 1 pill, take it as soon as you remember. Take the next pill at the regular time.   If you miss 2 or more pills, call your health care provider because different pills have different instructions for missed doses. Use backup birth control until your next menstrual period starts.   If you use a 28-day pack that contains inactive pills and you miss 1 of the last 7 pills (pills with no hormones), it will not matter. Throw away the rest of the non-hormone pills and start a new pill pack.  No matter which day you start the OCP, you will always start a new pack on that same day of the week. Have an extra pack of OCPs and a backup contraceptive method available in case you miss some pills or lose your OCP pack.  HOME CARE INSTRUCTIONS   Do not smoke.   Always use a condom to protect against STDs. OCPs do not protect against STDs.   Use a calendar to mark your menstrual period days.   Read the information and directions that came with your OCP. Talk to your health care provider if you have questions.  SEEK MEDICAL CARE IF:   You develop nausea and vomiting.   You have abnormal vaginal discharge or bleeding.   You develop a rash.   You miss your menstrual period.   You are losing   your hair.   You need treatment for mood swings or depression.   You get dizzy when taking the OCP.   You develop acne from taking the OCP.   You become pregnant.  SEEK IMMEDIATE MEDICAL CARE IF:   You develop chest pain.   You develop shortness of breath.   You have an uncontrolled or severe headache.   You develop numbness or slurred speech.   You develop visual problems.   You develop pain, redness, and swelling in the legs.    This information is not intended to replace advice given to you by your health care provider. Make sure you discuss any questions you have with your health care provider.   Document  Released: 03/19/2011 Document Revised: 04/20/2014 Document Reviewed: 09/18/2012 Elsevier Interactive Patient Education 2016 Elsevier Inc.  

## 2015-07-23 NOTE — Progress Notes (Signed)
Pt here for postpartum check, denies any problems at this time.  Requesting birth control pill Sharon King) for contraception. Pt newborn will be discharged from the NICU in the morning.

## 2015-10-30 ENCOUNTER — Telehealth: Payer: Self-pay | Admitting: *Deleted

## 2015-10-30 NOTE — Telephone Encounter (Signed)
-----   Message from Francia Greaves sent at 10/30/2015  4:18 PM EDT ----- Regarding: Rx Request Contact: [phone] Wants topamax called into her pharmacy

## 2015-10-30 NOTE — Telephone Encounter (Signed)
-----   Message from Francia Greaves sent at 10/30/2015  4:18 PM EDT ----- Regarding: Rx Request Contact: [phone] Wants topamax called into her pharmacy

## 2015-10-30 NOTE — Telephone Encounter (Signed)
Called pt to adv Topamax has never been written by any of our providers here at [facility] and she will need to contact her Neuro who originally wrote it. She states she has not seen Neuro in a long time and would need to find another one. Offered appt with [name] to f/u headache. Appt made 11/15/15

## 2015-11-15 ENCOUNTER — Encounter: Payer: 59 | Admitting: Physician Assistant

## 2016-01-01 ENCOUNTER — Encounter: Payer: Self-pay | Admitting: *Deleted

## 2016-01-10 ENCOUNTER — Encounter: Payer: 59 | Admitting: Physician Assistant

## 2016-01-10 DIAGNOSIS — R51 Headache: Secondary | ICD-10-CM

## 2016-01-21 ENCOUNTER — Telehealth: Payer: Self-pay | Admitting: *Deleted

## 2016-01-21 DIAGNOSIS — Z3041 Encounter for surveillance of contraceptive pills: Secondary | ICD-10-CM

## 2016-01-21 DIAGNOSIS — G4709 Other insomnia: Secondary | ICD-10-CM

## 2016-01-21 MED ORDER — NORGESTIMATE-ETH ESTRADIOL 0.25-35 MG-MCG PO TABS: 1.0000 | ORAL_TABLET | Freq: Every day | ORAL | 4 refills | Status: DC

## 2016-01-21 MED ORDER — ZOLPIDEM TARTRATE 5 MG PO TABS: 5.0000 mg | ORAL_TABLET | Freq: Every evening | ORAL | 0 refills | Status: DC | PRN

## 2016-01-21 NOTE — Telephone Encounter (Signed)
Spoke to the pt, states she has been experiencing night sweats, difficulty sleeping, migraines, and irregular bleeding.  Believes it could be hormonal and related to the birth control.  Spoke with Dr Kennon Rounds about pt case and recommended changing to [place], also sent Ambien 10 count for pt to use to help with sleep while her body transitions to the new pill. Instructed pt on medication use.

## 2016-01-21 NOTE — Telephone Encounter (Signed)
-----   Message from Francia Greaves sent at 01/21/2016  2:11 PM EDT ----- Regarding: Rx Swap Contact: [phone] Wants to know if she could switch to another OCP, states she is having mood swings and hot flashes with this one

## 2016-02-18 ENCOUNTER — Ambulatory Visit (INDEPENDENT_AMBULATORY_CARE_PROVIDER_SITE_OTHER): Payer: Self-pay | Admitting: Physician Assistant

## 2016-02-18 ENCOUNTER — Encounter: Payer: Self-pay | Admitting: Physician Assistant

## 2016-02-18 VITALS — BP 124/85 | HR 106 | Resp 18 | Ht 64.0 in | Wt 187.0 lb

## 2016-02-18 DIAGNOSIS — G43009 Migraine without aura, not intractable, without status migrainosus: Secondary | ICD-10-CM | POA: Insufficient documentation

## 2016-02-18 DIAGNOSIS — M62838 Other muscle spasm: Secondary | ICD-10-CM

## 2016-02-18 DIAGNOSIS — G4700 Insomnia, unspecified: Secondary | ICD-10-CM

## 2016-02-18 MED ORDER — CYCLOBENZAPRINE HCL 10 MG PO TABS: 10.0000 mg | ORAL_TABLET | Freq: Three times a day (TID) | ORAL | 1 refills | Status: DC | PRN

## 2016-02-18 MED ORDER — AMITRIPTYLINE HCL 25 MG PO TABS: 50.0000 mg | ORAL_TABLET | Freq: Every day | ORAL | 2 refills | Status: DC

## 2016-02-18 MED ORDER — ZOLMITRIPTAN 2.5 MG PO TABS: 2.5000 mg | ORAL_TABLET | Freq: Once | ORAL | 3 refills | Status: DC

## 2016-02-18 NOTE — Patient Instructions (Signed)

## 2016-02-18 NOTE — Progress Notes (Signed)
History:  Sharon King is a 30 y.o. G1P0100 who presents to clinic today for HA eval.  They were better during pregnancy.  Since delivery they have returned and are severe.   It is located behind the eyes (one side at a time).There is pulsating, worse with movement, sensitive to light and noise.  No aura.  Often has neck tension prior to HA.  She used Topamax for prevention for 14 years and it was helpful (400mg  daily) , quit after learning of pregnancy.  Gabapentin, paxil, lorazepam, acetazolamide, tizanidine, propanolol, dilantin, amitriptyline,  are other meds used in past.   She had lidocaine injections that did not work.  She was offered Botox but did not want to try this.  Weather used to be a trigger but not right now.  Triggered by stress, sleep changes.   Started different OCP last month.  Sprintec. This seems to be helpful.  Ambien used and helpful.   Flexeril has been helpful in past.  (Moreso than baclofen).   HIT6:66 Number of days in the last 4 weeks with:  Severe headache: 7 Moderate headache: 5 Mild headache: 10  No headache: 6   Past Medical History:  Diagnosis Date  . Migraine   . Migraines   . Seizures ([place])    Last seizure 2014    Social History   Social History  . Marital status: Married    Spouse name: N/A  . Number of children: N/A  . Years of education: N/A   Occupational History  . Not on file.   Social History Main Topics  . Smoking status: Never Smoker  . Smokeless tobacco: Former Systems developer  . Alcohol use No     Comment: rare-stopped with pregnancy  . Drug use: No  . Sexual activity: Yes    Birth control/ protection: Condom   Other Topics Concern  . Not on file   Social History Narrative  . No narrative on file    Family History  Problem Relation Age of Onset  . Hyperlipidemia Mother   . Arthritis Mother   . Depression Mother     Allergies  Allergen Reactions  . Erythromycin Rash  . Paroxetine Palpitations  . Sulfonamide  Derivatives Rash    Current Outpatient Prescriptions on File Prior to Visit  Medication Sig Dispense Refill  . diphenhydrAMINE (BENADRYL) 25 MG tablet Take 50 mg by mouth at bedtime as needed for sleep. Reported on 06/25/2015    . norgestimate-ethinyl estradiol (ORTHO-CYCLEN,SPRINTEC,PREVIFEM) 0.25-35 MG-MCG tablet Take 1 tablet by mouth daily. Take active pills in a continuous fashion. 3 Package 4  . zolpidem (AMBIEN) 5 MG tablet Take 1 tablet (5 mg total) by mouth at bedtime as needed for sleep. 10 tablet 0   No current facility-administered medications on file prior to visit.      Review of Systems:  All pertinent positive/negative included in HPI, all other review of systems are negative   Objective:  Physical Exam BP 124/85 (BP Location: Left Arm, Patient Position: Sitting, Cuff Size: Normal)   Pulse (!) 106   Resp 18   Ht 5\' 4"  (1.626 m)   Wt 187 lb (84.8 kg)   BMI 32.10 kg/m  CONSTITUTIONAL: Well-developed, well-nourished female in no acute distress.  EYES: EOM intact ENT: Normocephalic CARDIOVASCULAR: Regular rate  RESPIRATORY: Normal rate.    MUSCULOSKELETAL: Normal ROM SKIN: Warm, dry without erythema  NEUROLOGICAL: Alert, oriented, CN II-XII grossly intact, Appropriate balance PSYCH: Normal behavior, mood   Assessment &  Plan:  Assessment: 1. Migraine without aura and without status migrainosus, not intractable   2. Muscle spasm   3. Insomnia, unspecified type      Plan: Amitriptyline.  Begin 25mg  qhs and increase to 50mg  as tolerated.  This is for migraine prevention as well as to help with sleep. zomig 2.5mg  for acute migraine Flexeril for muscle tension/HA Okay to add Reglan prn (pt has)   Follow-up in 2-3 months or sooner PRN  Paticia Stack, PA-C 02/18/2016 11:03 AM

## 2016-03-18 ENCOUNTER — Telehealth: Payer: Self-pay | Admitting: *Deleted

## 2016-03-18 NOTE — Telephone Encounter (Signed)
Spoke to pt, is experiencing an acute migraine and has taken a 1/2 tablet of Imitrex as well as Flexeril, with little relief.  Wants to know if she can try anything else.  Informed pt that she could try the Zomig but would have to wait no sooner than 12 hours after taking the Imitrex.  Pt is currently at work and cannot leave at this time so Phenergan and sleep would not be an option, informed pt that she could come by the office for an injection of Toradol 60 mg, pt will try to come by this evening once her coverage arrives if she is still experiencing the migraine.

## 2016-03-18 NOTE — Telephone Encounter (Signed)
-----   Message from Francia Greaves sent at 03/18/2016 11:11 AM EST ----- Regarding: Advise Contact: [phone] Having a severe migraine, wants to know if she could have something called in, or what she could do to ease it

## 2016-04-09 ENCOUNTER — Other Ambulatory Visit: Payer: Self-pay | Admitting: Physician Assistant

## 2016-04-17 ENCOUNTER — Encounter: Payer: Self-pay | Admitting: Physician Assistant

## 2016-04-17 ENCOUNTER — Ambulatory Visit (INDEPENDENT_AMBULATORY_CARE_PROVIDER_SITE_OTHER): Payer: Self-pay | Admitting: Physician Assistant

## 2016-04-17 VITALS — BP 110/78 | HR 109 | Resp 18 | Ht 63.5 in | Wt 185.0 lb

## 2016-04-17 DIAGNOSIS — G43009 Migraine without aura, not intractable, without status migrainosus: Secondary | ICD-10-CM

## 2016-04-17 DIAGNOSIS — M62838 Other muscle spasm: Secondary | ICD-10-CM

## 2016-04-17 MED ORDER — CYCLOBENZAPRINE HCL 10 MG PO TABS: 10.0000 mg | ORAL_TABLET | Freq: Three times a day (TID) | ORAL | 1 refills | Status: DC | PRN

## 2016-04-17 MED ORDER — KETOROLAC TROMETHAMINE 10 MG PO TABS: 10.0000 mg | ORAL_TABLET | Freq: Four times a day (QID) | ORAL | 0 refills | Status: DC | PRN

## 2016-04-17 MED ORDER — AMITRIPTYLINE HCL 50 MG PO TABS: 50.0000 mg | ORAL_TABLET | Freq: Every day | ORAL | 6 refills | Status: DC

## 2016-04-17 NOTE — Patient Instructions (Signed)
Muscle Cramps and Spasms Muscle cramps and spasms are when muscles tighten by themselves. They usually get better within minutes. Muscle cramps are painful. They are usually stronger and last longer than muscle spasms. Muscle spasms may or may not be painful. They can last a few seconds or much longer. HOME CARE  Drink enough fluid to keep your pee (urine) clear or pale yellow.  Massage, stretch, and relax the muscle.  Use a warm towel, heating pad, or warm shower water on tight muscles.  Place ice on the muscle if it is tender or in pain.  Put ice in a plastic bag.  Place a towel between your skin and the bag.  Leave the ice on for 15-20 minutes, 3-4 times a day.  Only take medicine as told by your doctor. GET HELP RIGHT AWAY IF:  Your cramps or spasms get worse, happen more often, or do not get better with time. MAKE SURE YOU:  Understand these instructions.  Will watch your condition.  Will get help right away if you are not doing well or get worse. This information is not intended to replace advice given to you by your health care provider. Make sure you discuss any questions you have with your health care provider. Document Released: 03/12/2008 Document Revised: 07/25/2012 Document Reviewed: 01/01/2015 Elsevier Interactive Patient Education  2017 [place]. Migraine Headache A migraine headache is an intense, throbbing pain on one side or both sides of the head. Migraines may also cause other symptoms, such as nausea, vomiting, and sensitivity to light and noise. What are the causes? Doing or taking certain things may also trigger migraines, such as:  Alcohol.  Smoking.  Medicines, such as:  Medicine used to treat chest pain (nitroglycerine).  Birth control pills.  Estrogen pills.  Certain blood pressure medicines.  Aged cheeses, chocolate, or caffeine.  Foods or drinks that contain nitrates, glutamate, aspartame, or tyramine.  Physical activity. Other  things that may trigger a migraine include:  Menstruation.  Pregnancy.  Hunger.  Stress, lack of sleep, too much sleep, or fatigue.  Weather changes. What increases the risk? The following factors may make you more likely to experience migraine headaches:  Age. Risk increases with age.  Family history of migraine headaches.  Being Caucasian.  Depression and anxiety.  Obesity.  Being a woman.  Having a hole in the heart (patent foramen ovale) or other heart problems. What are the signs or symptoms? The main symptom of this condition is pulsating or throbbing pain. Pain may:  Happen in any area of the head, such as on one side or both sides.  Interfere with daily activities.  Get worse with physical activity.  Get worse with exposure to bright lights or loud noises. Other symptoms may include:  Nausea.  Vomiting.  Dizziness.  General sensitivity to bright lights, loud noises, or smells. Before you get a migraine, you may get warning signs that a migraine is developing (aura). An aura may include:  Seeing flashing lights or having blind spots.  Seeing bright spots, halos, or zigzag lines.  Having tunnel vision or blurred vision.  Having numbness or a tingling feeling.  Having trouble talking.  Having muscle weakness. How is this diagnosed? A migraine headache can be diagnosed based on:  Your symptoms.  A physical exam.  Tests, such as CT scan or MRI of the head. These imaging tests can help rule out other causes of headaches.  Taking fluid from the spine (lumbar puncture) and analyzing  it (cerebrospinal fluid analysis, or CSF analysis). How is this treated? A migraine headache is usually treated with medicines that:  Relieve pain.  Relieve nausea.  Prevent migraines from coming back. Treatment may also include:  Acupuncture.  Lifestyle changes like avoiding foods that trigger migraines. Follow these instructions at  home: Medicines  Take over-the-counter and prescription medicines only as told by your health care provider.  Do not drive or use heavy machinery while taking prescription pain medicine.  To prevent or treat constipation while you are taking prescription pain medicine, your health care provider may recommend that you:  Drink enough fluid to keep your urine clear or pale yellow.  Take over-the-counter or prescription medicines.  Eat foods that are high in fiber, such as fresh fruits and vegetables, whole grains, and beans.  Limit foods that are high in fat and processed sugars, such as fried and sweet foods. Lifestyle  Avoid alcohol use.  Do not use any products that contain nicotine or tobacco, such as cigarettes and e-cigarettes. If you need help quitting, ask your health care provider.  Get at least 8 hours of sleep every night.  Limit your stress. General instructions  Keep a journal to find out what may trigger your migraine headaches. For example, write down:  What you eat and drink.  How much sleep you get.  Any change to your diet or medicines.  If you have a migraine:  Avoid things that make your symptoms worse, such as bright lights.  It may help to lie down in a dark, quiet room.  Do not drive or use heavy machinery.  Ask your health care provider what activities are safe for you while you are experiencing symptoms.  Keep all follow-up visits as told by your health care provider. This is important. Contact a health care provider if:  You develop symptoms that are different or more severe than your usual migraine symptoms. Get help right away if:  Your migraine becomes severe.  You have a fever.  You have a stiff neck.  You have vision loss.  Your muscles feel weak or like you cannot control them.  You start to lose your balance often.  You develop trouble walking.  You faint. This information is not intended to replace advice given to you by  your health care provider. Make sure you discuss any questions you have with your health care provider. Document Released: 03/30/2005 Document Revised: 10/18/2015 Document Reviewed: 09/16/2015 Elsevier Interactive Patient Education  2017 [employer].

## 2016-04-17 NOTE — Progress Notes (Signed)
History:  Sharon King is a 31 y.o. G1P0100 who presents to clinic today for HA followup.   She has seen improvement in number of HA but not in severity.  She had one really bad HA that was not helped by triptan. She woke up with that HA at 4am and it did not get better until she was able to go back to sleep.  She feels this was related to menstrual cycle.   So far, zomig 2.5mg  has not helped.  She only used it one time.  She has not trialed 5mg .    HIT6:62 Number of days in the last 4 weeks with:  Severe headache: 2 Moderate headache: 4 Mild headache: 2  No headache: 20   Past Medical History:  Diagnosis Date  . Migraine   . Migraines   . Seizures ([place])    Last seizure 2014    Social History   Social History  . Marital status: Married    Spouse name: N/A  . Number of children: N/A  . Years of education: N/A   Occupational History  . Not on file.   Social History Main Topics  . Smoking status: Never Smoker  . Smokeless tobacco: Former Systems developer  . Alcohol use No     Comment: rare-stopped with pregnancy  . Drug use: No  . Sexual activity: Yes    Birth control/ protection: Condom   Other Topics Concern  . Not on file   Social History Narrative  . No narrative on file    Family History  Problem Relation Age of Onset  . Hyperlipidemia Mother   . Arthritis Mother   . Depression Mother     Allergies  Allergen Reactions  . Erythromycin Rash  . Paroxetine Palpitations  . Sulfonamide Derivatives Rash    Current Outpatient Prescriptions on File Prior to Visit  Medication Sig Dispense Refill  . amitriptyline (ELAVIL) 25 MG tablet Take 2 tablets (50 mg total) by mouth at bedtime. 60 tablet 2  . cyclobenzaprine (FLEXERIL) 10 MG tablet Take 1 tablet (10 mg total) by mouth every 8 (eight) hours as needed for muscle spasms. 30 tablet 1  . diphenhydrAMINE (BENADRYL) 25 MG tablet Take 50 mg by mouth at bedtime as needed for sleep. Reported on 06/25/2015    .  norgestimate-ethinyl estradiol (ORTHO-CYCLEN,SPRINTEC,PREVIFEM) 0.25-35 MG-MCG tablet Take 1 tablet by mouth daily. Take active pills in a continuous fashion. 3 Package 4  . ZOLMitriptan (ZOMIG) 2.5 MG tablet Take 1 tablet (2.5 mg total) by mouth once. May repeat in 2 hours if headache persists or recurs. 10 tablet 3  . zolpidem (AMBIEN) 5 MG tablet Take 1 tablet (5 mg total) by mouth at bedtime as needed for sleep. (Patient not taking: Reported on 04/17/2016) 10 tablet 0   No current facility-administered medications on file prior to visit.      Review of Systems:  All pertinent positive/negative included in HPI, all other review of systems are negative   Objective:  Physical Exam BP 110/78 (BP Location: Left Arm, Patient Position: Sitting, Cuff Size: Normal)   Pulse (!) 109   Resp 18   Ht 5' 3.5" (1.613 m)   Wt 185 lb (83.9 kg)   LMP 04/01/2016   BMI 32.26 kg/m  CONSTITUTIONAL: Well-developed, well-nourished female in no acute distress.  EYES: EOM intact ENT: Normocephalic CARDIOVASCULAR: Regular rate and rhythm with no adventitious sounds.  RESPIRATORY: Normal rate. Clear to auscultation bilaterally.  MUSCULOSKELETAL: Normal ROM, strength equal bilaterally,  muscle spasm noted SKIN: Warm, dry without erythema  NEUROLOGICAL: Alert, oriented, CN II-XII grossly intact, Appropriate balance PSYCH: Normal behavior, mood   Assessment & Plan:  Assessment: 1. Muscle spasm   2. Migraine without aura and without status migrainosus, not intractable   Frequency of migraine improved but severity unimproved.     Plan: Encouraged to try 5mg  of zomig.  Even if no migraine - try and see if it causes drowsiness (which is the reason for switching from imitrex) Advised we will not be using opioid for rescue. Rx provided for Toradol oral for rescue.  She is not to use more than 3-4 times per month MAXIMUM.  No h/o kidney issues prior.   Continue amitriptyline 50mg  qhs for prevention.  She is  taking 1 hour before bed.   Advised discontinuation of nightly 50mg  benadryl.  Pt reluctant. Exercise advised. Follow-up in 6 months or sooner PRN  Paticia Stack, PA-C 04/17/2016 8:16 AM

## 2016-05-05 ENCOUNTER — Other Ambulatory Visit: Payer: Self-pay | Admitting: Physician Assistant

## 2016-05-15 ENCOUNTER — Other Ambulatory Visit: Payer: Self-pay | Admitting: Physician Assistant

## 2016-05-29 ENCOUNTER — Other Ambulatory Visit: Payer: Self-pay | Admitting: Physician Assistant

## 2016-06-03 ENCOUNTER — Other Ambulatory Visit: Payer: Self-pay | Admitting: Physician Assistant

## 2016-06-10 ENCOUNTER — Telehealth: Payer: Self-pay | Admitting: *Deleted

## 2016-06-10 ENCOUNTER — Other Ambulatory Visit: Payer: Self-pay | Admitting: *Deleted

## 2016-06-10 DIAGNOSIS — G43009 Migraine without aura, not intractable, without status migrainosus: Secondary | ICD-10-CM

## 2016-06-10 MED ORDER — CYCLOBENZAPRINE HCL 10 MG PO TABS: 10.0000 mg | ORAL_TABLET | Freq: Three times a day (TID) | ORAL | 0 refills | Status: DC | PRN

## 2016-06-10 MED ORDER — KETOROLAC TROMETHAMINE 10 MG PO TABS: 10.0000 mg | ORAL_TABLET | Freq: Four times a day (QID) | ORAL | 0 refills | Status: DC | PRN

## 2016-06-10 NOTE — Telephone Encounter (Signed)
Opened in error

## 2016-07-07 ENCOUNTER — Encounter: Payer: Medicaid Other | Admitting: Physician Assistant

## 2016-07-07 ENCOUNTER — Telehealth: Payer: Self-pay | Admitting: *Deleted

## 2016-07-07 DIAGNOSIS — G43009 Migraine without aura, not intractable, without status migrainosus: Secondary | ICD-10-CM

## 2016-07-07 DIAGNOSIS — R51 Headache: Secondary | ICD-10-CM

## 2016-07-07 MED ORDER — CYCLOBENZAPRINE HCL 10 MG PO TABS: 10.0000 mg | ORAL_TABLET | Freq: Three times a day (TID) | ORAL | 2 refills | Status: DC | PRN

## 2016-07-07 MED ORDER — KETOROLAC TROMETHAMINE 10 MG PO TABS: 10.0000 mg | ORAL_TABLET | Freq: Four times a day (QID) | ORAL | 2 refills | Status: DC | PRN

## 2016-07-07 NOTE — Telephone Encounter (Signed)
Flexeril quantity changed and sent to pharmacy.

## 2016-07-07 NOTE — Telephone Encounter (Signed)
Pt unable to keep follow-up appt with [name] due to husband being in the ER.  Request refills on medications.  Refills for Flexeril and Toradol sent to pharmacy.

## 2016-09-04 ENCOUNTER — Encounter: Payer: Medicaid Other | Admitting: Physician Assistant

## 2016-09-11 ENCOUNTER — Encounter: Payer: Medicaid Other | Admitting: Podiatry

## 2016-09-14 ENCOUNTER — Ambulatory Visit: Payer: Self-pay | Admitting: Podiatry

## 2016-09-18 ENCOUNTER — Encounter: Payer: Self-pay | Admitting: Obstetrics and Gynecology

## 2016-09-18 ENCOUNTER — Ambulatory Visit (INDEPENDENT_AMBULATORY_CARE_PROVIDER_SITE_OTHER): Payer: 59 | Admitting: Physician Assistant

## 2016-09-18 ENCOUNTER — Ambulatory Visit (INDEPENDENT_AMBULATORY_CARE_PROVIDER_SITE_OTHER): Payer: 59 | Admitting: Obstetrics and Gynecology

## 2016-09-18 ENCOUNTER — Encounter: Payer: Self-pay | Admitting: Physician Assistant

## 2016-09-18 VITALS — BP 117/78 | HR 98 | Resp 18 | Ht 64.0 in | Wt 176.0 lb

## 2016-09-18 DIAGNOSIS — M62838 Other muscle spasm: Secondary | ICD-10-CM

## 2016-09-18 DIAGNOSIS — G43009 Migraine without aura, not intractable, without status migrainosus: Secondary | ICD-10-CM | POA: Diagnosis not present

## 2016-09-18 DIAGNOSIS — Z1151 Encounter for screening for human papillomavirus (HPV): Secondary | ICD-10-CM

## 2016-09-18 DIAGNOSIS — Z01419 Encounter for gynecological examination (general) (routine) without abnormal findings: Secondary | ICD-10-CM

## 2016-09-18 DIAGNOSIS — Z124 Encounter for screening for malignant neoplasm of cervix: Secondary | ICD-10-CM | POA: Diagnosis not present

## 2016-09-18 DIAGNOSIS — Z3041 Encounter for surveillance of contraceptive pills: Secondary | ICD-10-CM

## 2016-09-18 MED ORDER — KETOROLAC TROMETHAMINE 10 MG PO TABS: 10.0000 mg | ORAL_TABLET | Freq: Four times a day (QID) | ORAL | 2 refills | Status: DC | PRN

## 2016-09-18 MED ORDER — AMITRIPTYLINE HCL 50 MG PO TABS: 50.0000 mg | ORAL_TABLET | Freq: Every day | ORAL | 6 refills | Status: DC

## 2016-09-18 MED ORDER — ZOLMITRIPTAN 2.5 MG PO TABS: 2.5000 mg | ORAL_TABLET | ORAL | 3 refills | Status: DC | PRN

## 2016-09-18 MED ORDER — NORGESTIMATE-ETH ESTRADIOL 0.25-35 MG-MCG PO TABS: 1.0000 | ORAL_TABLET | Freq: Every day | ORAL | 4 refills | Status: DC

## 2016-09-18 MED ORDER — ORPHENADRINE CITRATE ER 100 MG PO TB12: 100.0000 mg | ORAL_TABLET | Freq: Two times a day (BID) | ORAL | 1 refills | Status: DC

## 2016-09-18 NOTE — Progress Notes (Signed)
History:  Sharon King is a 31 y.o. G1P0101 who presents to clinic today for HA followup.  She feels the HAs are still much better controlled than they were initially but she is having more days with mild to mod HA.  The flexeril is helpful but she thinks it could be better.  zomig- uses 2.5mg  (lower dose) with good results but does not use often.  Pt considering seeking pregnancy within the next year.  Amitriptyline helpful for HA prevention with only occasional dry mouth and no weight gain.  Instead, shes lost 6#.    HIT6:54 Number of days in the last 4 weeks with:  Severe headache: 2 Moderate headache: 5 Mild headache: 10  No headache: 11   Past Medical History:  Diagnosis Date  . History of preterm delivery   . Migraines   . Seizures ([place])    Last seizure 2014    Social History   Social History  . Marital status: Married    Spouse name: N/A  . Number of children: N/A  . Years of education: N/A   Occupational History  . Not on file.   Social History Main Topics  . Smoking status: Never Smoker  . Smokeless tobacco: Former Systems developer  . Alcohol use No     Comment: rare-stopped with pregnancy  . Drug use: No  . Sexual activity: Yes    Birth control/ protection: Pill   Other Topics Concern  . Not on file   Social History Narrative  . No narrative on file    Family History  Problem Relation Age of Onset  . Hyperlipidemia Mother   . Arthritis Mother   . Depression Mother     Allergies  Allergen Reactions  . Erythromycin Rash  . Paroxetine Palpitations  . Sulfonamide Derivatives Rash    Current Outpatient Prescriptions on File Prior to Visit  Medication Sig Dispense Refill  . cyclobenzaprine (FLEXERIL) 10 MG tablet Take 1 tablet (10 mg total) by mouth every 8 (eight) hours as needed for muscle spasms. 30 tablet 2  . diphenhydrAMINE (BENADRYL) 25 MG tablet Take 50 mg by mouth at bedtime as needed for sleep. Reported on 06/25/2015    . zolpidem (AMBIEN) 5 MG  tablet Take 1 tablet (5 mg total) by mouth at bedtime as needed for sleep. 10 tablet 0   No current facility-administered medications on file prior to visit.      Review of Systems:  All pertinent positive/negative included in HPI, all other review of systems are negative   Objective:  Physical Exam BP 117/78   Pulse 98   Resp 18   Ht 5\' 4"  (1.626 m)   Wt 176 lb (79.8 kg)   BMI 30.21 kg/m  CONSTITUTIONAL: Well-developed, well-nourished female in no acute distress.  EYES: EOM intact ENT: Normocephalic CARDIOVASCULAR: Regular rate  RESPIRATORY: Normal rate. MUSCULOSKELETAL: Normal ROM SKIN: Warm, dry without erythema  NEUROLOGICAL: Alert, oriented, CN II-XII grossly intact PSYCH: Normal behavior, mood   Assessment & Plan:  Assessment: 1. Migraine without aura and without status migrainosus, not intractable   2.      Muscle Spasm   Plan: Will trial Norflex in place of Flexeril.  If it is not an improvement, she can switch back to flexeril.  If she should seek pregnancy, she will return to flexeril.  Amitriptyline - maintain at 50mg  nightly.  Taper and discontinue if seeking pregnancy Zomig - pt declines to step up to 5mg .  Will maintain 2.5mg  prn  Toradol - for migraine rescue.  Limit use.  Do not use in pregnancy RTC 6 months Follow-up in 6 months or sooner PRN  Paticia Stack, PA-C 09/18/2016 12:01 PM

## 2016-09-18 NOTE — Patient Instructions (Signed)
Acetaminophen; Aspirin, ASA; Caffeine tablets, caplets, or geltabs What is this medicine? ACETAMINOPHEN; ASPIRIN; CAFFEINE (a set a MEE noe fen; AS pir in; KAF een) is a pain reliever. It is used to treat mild aches and pains. This medicine may help with arthritis, colds, headache (including migraine), muscle aches, menstrual cramps, sinusitis, and toothache. This medicine may be used for other purposes; ask your health care provider or pharmacist if you have questions. COMMON BRAND NAME(S): Bayer Migraine Formula, Excedrin Extra Strength, Excedrin Extra Strength Express, Excedrin Menstrual Complete, Excedrin Migraine, Genaced, Goody's Extra Strength, Pain Reliever Plus, Pamprin, Vanquish What should I tell my health care provider before I take this medicine? They need to know if you have any of these conditions: -anemia -anxiety or panic attacks -asthma -bleeding problems -child with chickenpox, the flu, or other viral infection -diabetes -gout -heart disease -high blood pressure -if you often drink alcohol -kidney disease -liver disease -low level of vitamin K -lupus -smoke tobacco -stomach ulcers or other problems -trouble sleeping -an unusual or allergic reaction to acetaminophen, aspirin, caffeine, other medicines, foods, dyes, or preservatives -pregnant or trying to get pregnant -breast-feeding How should I use this medicine? Take this medicine by mouth with a glass of water. Follow the directions on the package or prescription label. You can take this medicine with or without food. If it upsets your stomach, take it with food. Take your medicine at regular intervals. Do not take your medicine more often than directed. Talk to your pediatrician regarding the use of this medicine in children. While this drug may be prescribed for children as young as 12 years for selected conditions, precautions do apply. Patients over 24 years old may have a stronger reaction and need a smaller  dose. Overdosage: If you think you have taken too much of this medicine contact a poison control center or emergency room at once. NOTE: This medicine is only for you. Do not share this medicine with others. What if I miss a dose? If you miss a dose, take it as soon as you can. If it is almost time for your next dose, take only that dose. Do not take double or extra doses. What may interact with this medicine? Do not take this medicine with any of the following medications: -MAOIs like Carbex, Eldepryl, Marplan, Nardil, and Parnate -methotrexate -other medicines with acetaminophen -probenecid This medicine may also interact with the following medications: -alcohol -alendronate -bismuth subsalicylate -clozapine -flavocoxid -grapefruit juice -herbal supplements like feverfew, garlic, ginger, ginkgo biloba, horse chestnut -isoniazid -lithium -medicines for diabetes or glaucoma like acetazolamide, methazolamide -medicines for gout -medicines that stimulate or keep you awake -medicines that treat or prevent blood clots like enoxaparin, heparin, ticlopidine, warfarin -NSAIDs, medicines for pain and inflammation, like ibuprofen or naproxen -other aspirin and aspirin-like medicines -some cough and cold medicines like pseudoephedrine -varicella live vaccine This list may not describe all possible interactions. Give your health care provider a list of all the medicines, herbs, non-prescription drugs, or dietary supplements you use. Also tell them if you smoke, drink alcohol, or use illegal drugs. Some items may interact with your medicine. What should I watch for while using this medicine? Tell your doctor or health care professional if the pain lasts more than 10 days, if it gets worse, or if there is a new or different kind of pain. Tell your doctor if you see redness or swelling. If you are treating a fever, check with your doctor if the fever that lasts for  more than 3 days. Do not take  Tylenol (acetaminophen) or medicines that have acetaminophen with this medicine. Too much acetaminophen can be very dangerous. Always read medicine labels carefully. Report any possible overdose to your doctor or health care professional right away, even if there are no symptoms. The effects of extra doses may not be seen for many days. This medicine can irritate your stomach or cause bleeding problems. Do not smoke cigarettes or drink alcohol. Do not lie down for 30 minutes after taking this medicine to prevent irritation to your throat. If you are scheduled for any medical or dental procedure, tell your healthcare provider that you are taking this medicine. You may need to stop taking this medicine before the procedure. Do not take this medicine close to bedtime. It may prevent you from sleeping. This medicine may be used to treat migraines. If you take migraine medicines for 10 or more days a month, your migraines may get worse. Keep a diary of headache days and medicine use. Contact your healthcare professional if your migraine attacks occur more frequently. What side effects may I notice from receiving this medicine? Side effects that you should report to your doctor or health care professional as soon as possible: -allergic reactions like skin rash, itching or hives, swelling of the face, lips, or tongue -breathing problems -fast, irregular heartbeat -feeling faint or lightheaded, falls -fever or sore throat -pain on swallowing -ringing in the ears or trouble hearing -signs and symptoms of bleeding such as bloody or black, tarry stools; red or dark-brown urine; spitting up blood or brown material that looks like coffee grounds; red spots on the skin; unusual bruising or bleeding from the eye, gums, or nose -trouble passing urine or change in the amount of urine -unusually weak or tired -yellowing of the eyes or skin Side effects that usually do not require medical attention (report to your  doctor or health care professional if they continue or are bothersome): -diarrhea -headache -nausea, vomiting -passing urine more often -trouble sleeping This list may not describe all possible side effects. Call your doctor for medical advice about side effects. You may report side effects to FDA at 1-800-FDA-1088. Where should I keep my medicine? Keep out of the reach of children. Store at room temperature between 15 and 30 degrees C (59 and 86 degrees F). Keep container tightly closed. Throw away any unused medicine after the expiration date. NOTE: This sheet is a summary. It may not cover all possible information. If you have questions about this medicine, talk to your doctor, pharmacist, or health care provider.  2018 Elsevier/Gold Standard (2012-11-29 11:03:07)

## 2016-09-18 NOTE — Progress Notes (Signed)
Obstetrics and Gynecology Annual Patient Evaluation  Appointment Date: 09/18/2016  OBGYN Clinic: [facility]  Primary Care Provider: Cassandria Anger  Referring Provider: Cassandria Anger, MD  Chief Complaint:  Chief Complaint  Patient presents with  . Gynecologic Exam    History of Present Illness: Sharon King is a 31 y.o. Caucasian G1P0101 (No LMP recorded. Patient is not currently having periods (Reason: Oral contraceptives).), seen for the above chief complaint. Her past medical history is significant for migraines, h/o c-section, BMI 36.   She has no OBGYN problems or issues today  No breast s/s, fevers, chills, chest pain, SOB, nausea, vomiting, abdominal pain, dysuria, hematuria, vaginal itching, dyspareunia, SUI or OAB, diarrhea, constipation,   Review of Systems:  as noted in the History of Present Illness.  Past Medical History:  Past Medical History:  Diagnosis Date  . History of preterm delivery   . Migraines   . Seizures ([place])    Last seizure 2014    Past Surgical History:  Past Surgical History:  Procedure Laterality Date  . BREAST ENHANCEMENT SURGERY  2008  . CESAREAN SECTION N/A 06/13/2015   Procedure: CESAREAN SECTION;  Surgeon: Lavonia Drafts, MD;  Location: [facility];  Service: Obstetrics;  Laterality: N/A;  . wisdom teeth      Past Obstetrical History:  OB History  Gravida Para Term Preterm AB Living  1 1   1   1   SAB TAB Ectopic Multiple Live Births        0 1    # Outcome Date GA Lbr Len/2nd Weight Sex Delivery Anes PTL Lv  1 Preterm 06/13/15 [redacted]w[redacted]d  4 lb 5.5 oz (1.97 kg) M CS-LTranv Spinal       Birth Comments: RLE amniotic band (not cicumferential)      Past Gynecological History: As per HPI. Periods: on continuous OCPs. She has no BTB or spotting and has a period every few months No. history of abnormal pap smears (last pap smear: 2016, which was NILM) She is currently using continuous  OCPs for contraception.   Social History:  Social History   Social History  . Marital status: Married    Spouse name: N/A  . Number of children: N/A  . Years of education: N/A   Occupational History  . Not on file.   Social History Main Topics  . Smoking status: Never Smoker  . Smokeless tobacco: Former Systems developer  . Alcohol use No     Comment: rare-stopped with pregnancy  . Drug use: No  . Sexual activity: Yes    Birth control/ protection: Pill   Other Topics Concern  . Not on file   Social History Narrative  . No narrative on file    Family History:  Family History  Problem Relation Age of Onset  . Hyperlipidemia Mother   . Arthritis Mother   . Depression Mother    She denies any female cancers   Medications Ms. Claxton had no medications administered during this visit. Current Outpatient Prescriptions  Medication Sig Dispense Refill  . amitriptyline (ELAVIL) 50 MG tablet Take 1 tablet (50 mg total) by mouth at bedtime. 30 tablet 6  . cyclobenzaprine (FLEXERIL) 10 MG tablet Take 1 tablet (10 mg total) by mouth every 8 (eight) hours as needed for muscle spasms. 30 tablet 2  . diphenhydrAMINE (BENADRYL) 25 MG tablet Take 50 mg by mouth at bedtime as needed for sleep. Reported on 06/25/2015    . ketorolac (TORADOL)  10 MG tablet Take 1 tablet (10 mg total) by mouth every 6 (six) hours as needed. 4 tablet 2  . norgestimate-ethinyl estradiol (ORTHO-CYCLEN,SPRINTEC,PREVIFEM) 0.25-35 MG-MCG tablet Take 1 tablet by mouth daily. Take active pills in a continuous fashion. Have a withdrawal bleed once every 3-6 months 3 Package 4  . zolpidem (AMBIEN) 5 MG tablet Take 1 tablet (5 mg total) by mouth at bedtime as needed for sleep. 10 tablet 0  . ZOLMitriptan (ZOMIG) 2.5 MG tablet Take 1 tablet (2.5 mg total) by mouth once. May repeat in 2 hours if headache persists or recurs. 10 tablet 3   No current facility-administered medications for this visit.     Allergies Erythromycin;  Paroxetine; and Sulfonamide derivatives   Physical Exam:  BP 117/78   Pulse 98   Resp 18   Ht 5\' 4"  (1.626 m)   Wt 176 lb (79.8 kg)   BMI 30.21 kg/m  Body mass index is 30.21 kg/m. General appearance: Well nourished, well developed female in no acute distress.  Neck:  Supple, normal appearance, and no thyromegaly  Cardiovascular: normal s1 and s2.  No murmurs, rubs or gallops. Respiratory:  Clear to auscultation bilateral. Normal respiratory effort Abdomen: positive bowel sounds and no masses, hernias; diffusely non tender to palpation, non distended Breasts: breasts appear normal, no suspicious masses, no skin or nipple changes or axillary nodes, and normal palpation. Neuro/Psych:  Normal mood and affect.  Skin:  Warm and dry.  Lymphatic:  No inguinal lymphadenopathy.   Pelvic exam: is not limited by body habitus EGBUS: within normal limits, Vagina: within normal limits and with no blood or discharge in the vault, Cervix: normal appearing cervix without tenderness, discharge or lesions. Uterus:  nonenlarged and non tender and Adnexa:  normal adnexa and no mass, fullness, tenderness Rectovaginal: deferred  Laboratory: none  Radiology: none  Assessment: pt doing well  Plan:  1. Encounter for gynecological examination without abnormal finding Routine care. Pap updated today. OCPs refilled. Med rec done as pt is considering in the future trying again. Told her about elavil and category C nature and pros/cons of continued used when trying and during pregnancy.  - Cytology - PAP  RTC 1 year  Durene Romans MD Attending [facility])

## 2016-09-23 LAB — CYTOLOGY - PAP
DIAGNOSIS: NEGATIVE
HPV (WINDOPATH): NOT DETECTED

## 2016-10-06 IMAGING — US US MFM OB FOLLOW-UP
1 series · 14 of 28 positions shown · non-contrast
Comparison: none

[Series 1: us mfm ob follow-up · 55 acquisitions, 14 frames shown]
[im 3/55]
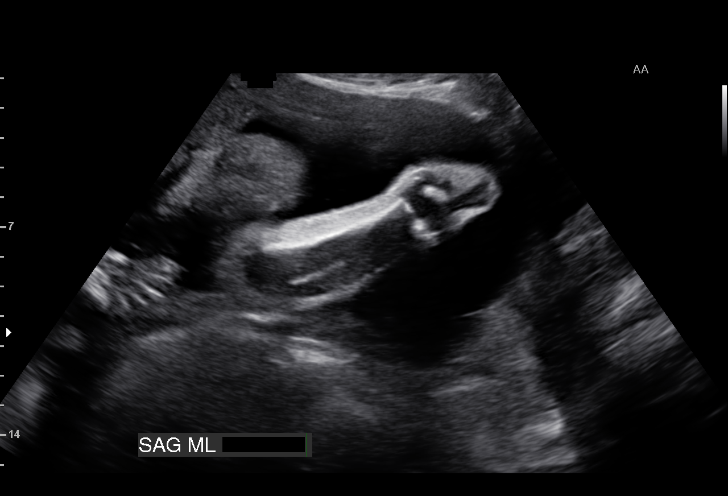
[im 7/55]
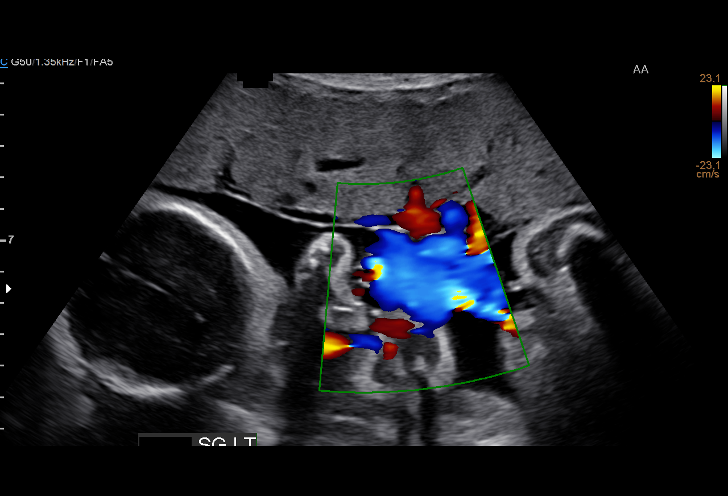
[im 11/55]
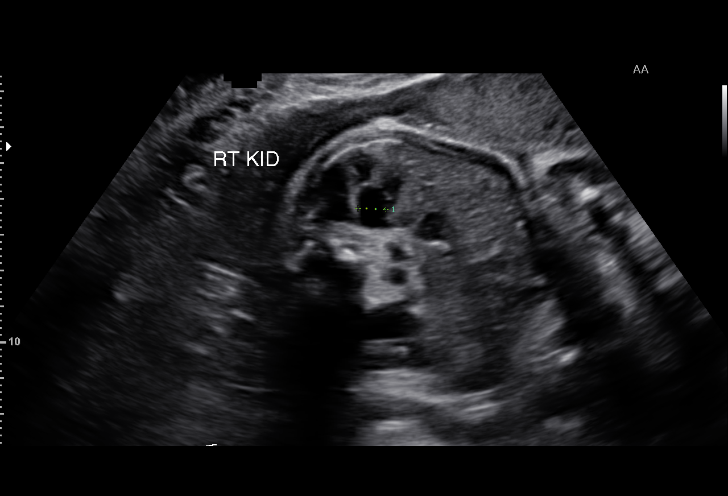
[im 15/55]
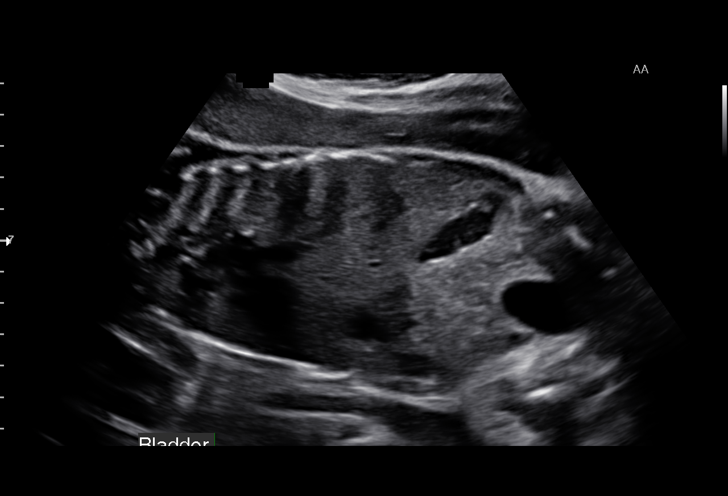
[im 19/55]
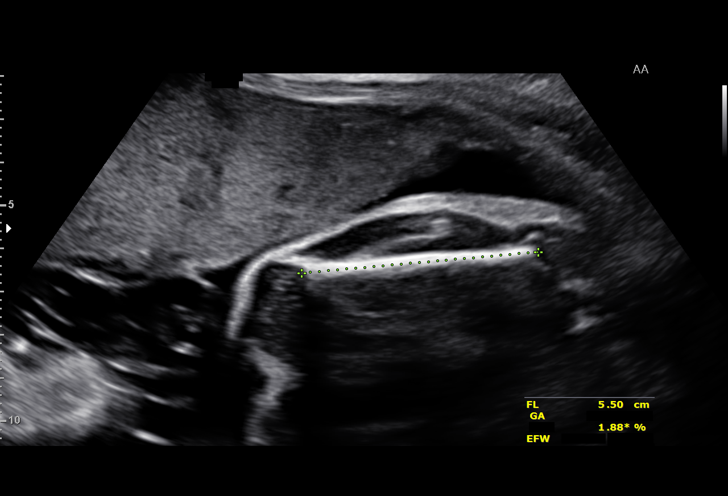
[im 23/55]
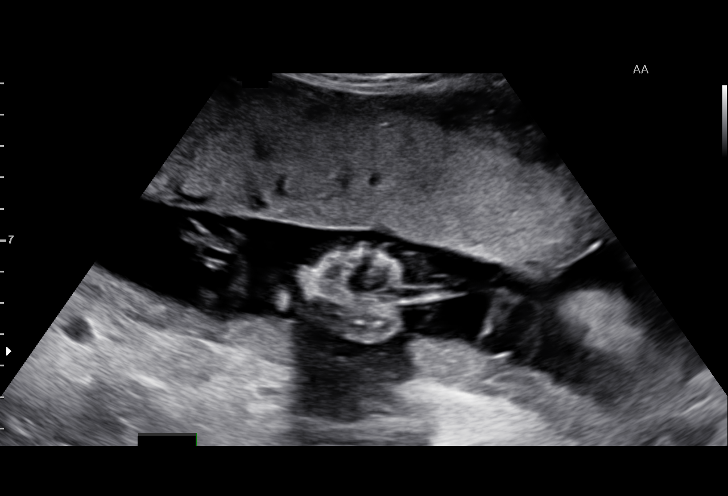
[im 27/55]
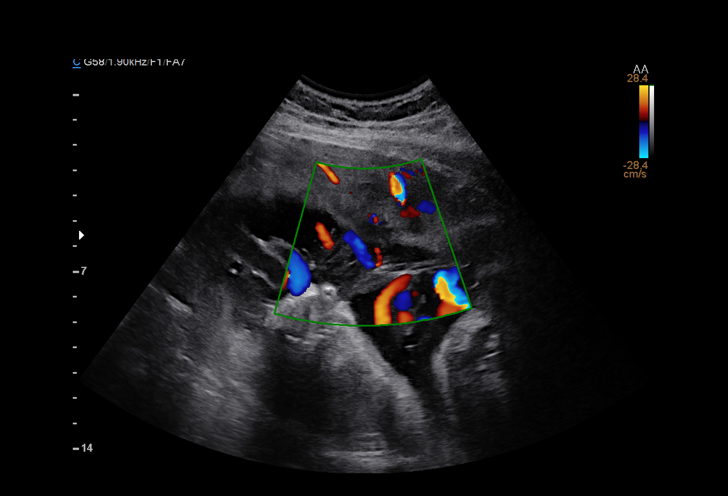
[im 31/55]
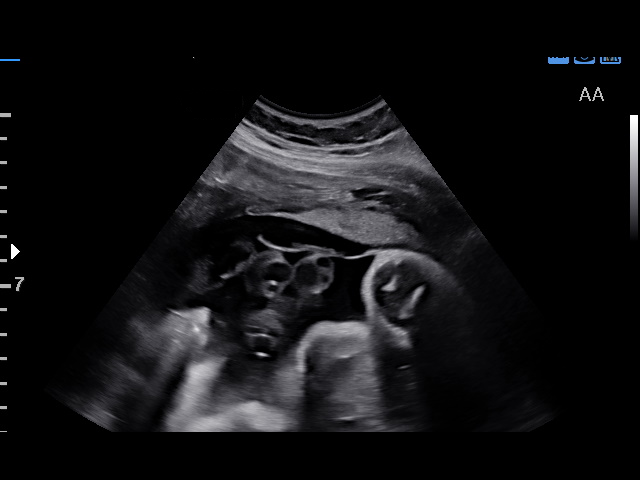
[im 35/55]
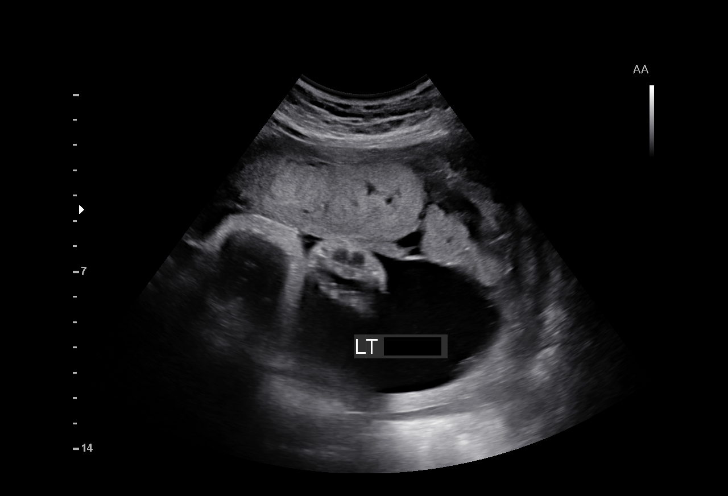
[im 39/55]
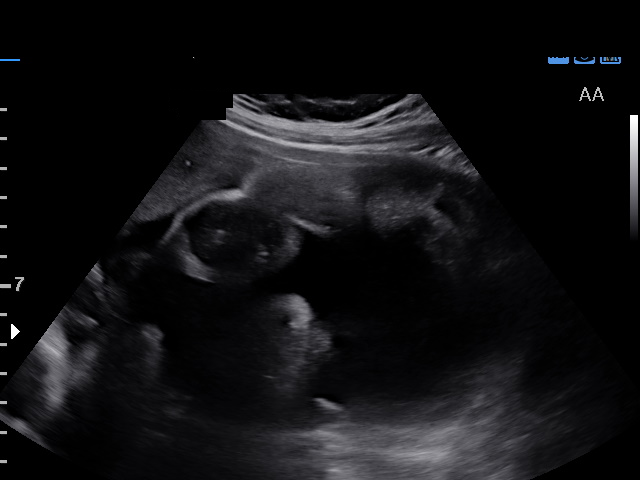
[im 43/55]
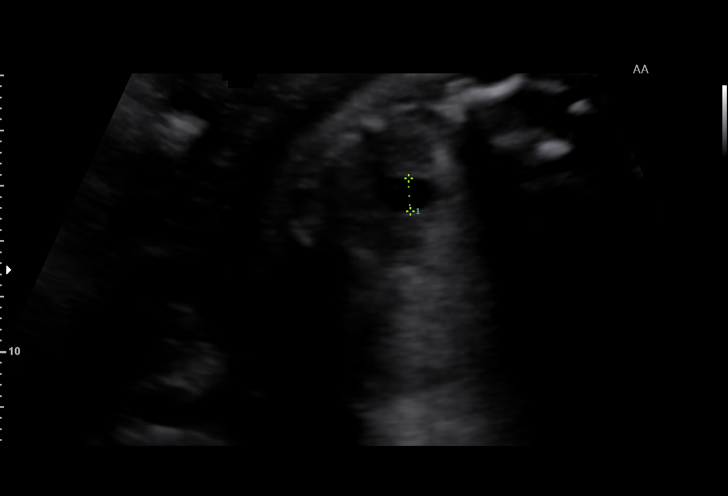
[im 47/55]
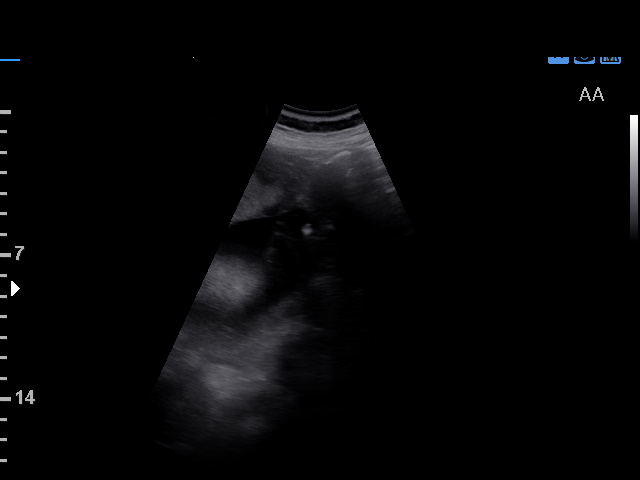
[im 51/55]
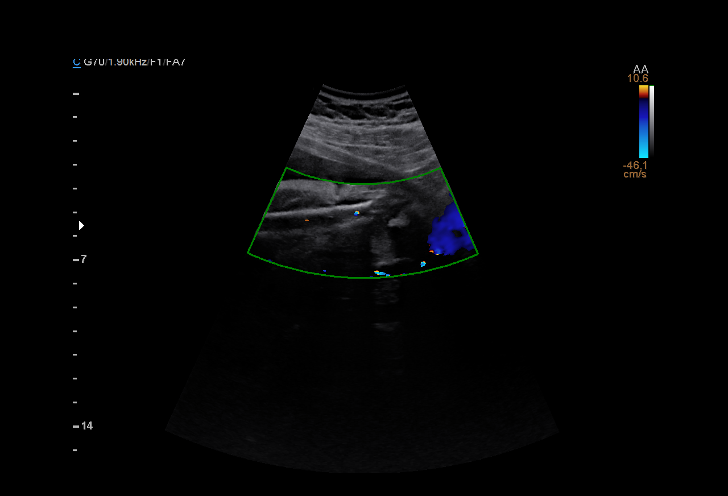
[im 55/55]
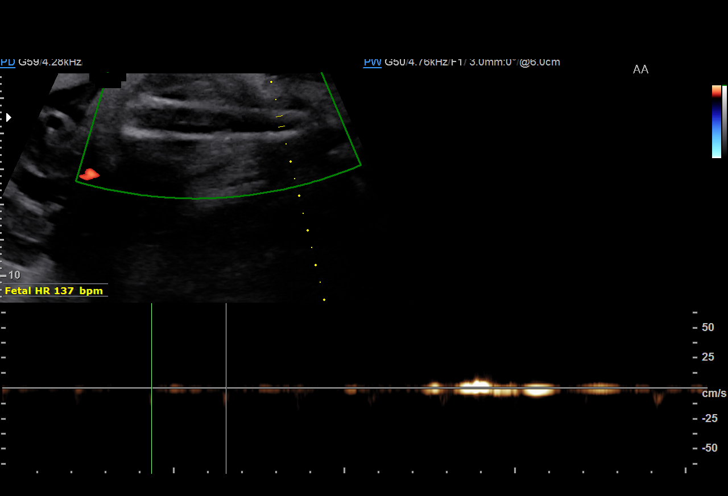

[14 of 28 positions shown; findings below may reference images not displayed]

[REDACTED]

1  GIANNY SCHRAMM            [id number]      [id number]     [id number]
Indications

31 weeks gestation of pregnancy
Amniotic band
OB History

Gravidity:    1
Fetal Evaluation

Num Of Fetuses:     1
Fetal Heart         155
Rate(bpm):
Cardiac Activity:   Observed
Presentation:       Breech, footling
Placenta:           Anterior, above cervical os

Amniotic Fluid
AFI FV:      Subjectively within normal limits
AFI Sum:     13.46   cm      43   %Tile     Larg Pckt:   4.61   cm
RUQ:   4.61   cm    RLQ:    3.24   cm    LUQ:   3.1     cm   LLQ:    2.51   cm
Biometry

BPD:      72.3  mm     G. Age:  29w 0d                  CI:        63.63   %   70 - 86
FL/HC:      18.5   %   19.3 -
HC:      291.9  mm     G. Age:  32w 1d         38  %    HC/AC:      1.03       0.96 -
AC:      283.4  mm     G. Age:  32w 2d         78  %    FL/BPD:     74.7   %   71 - 87
FL:         54  mm     G. Age:  28w 4d        < 3  %    FL/AC:      19.1   %   20 - 24
Est. FW:    8880  gm    3 lb 11 oz      48  %
Gestational Age

LMP:           34w 3d       Date:   10/09/14                 EDD:   07/16/15
U/S Today:     30w 4d                                        EDD:   08/12/15
Best:          31w 2d    Det. By:   Early Ultrasound         EDD:   08/07/15
(12/13/14)
Anatomy

Cranium:          Appears normal         Aortic Arch:      Previously seen
Fetal Cavum:      Previously seen        Ductal Arch:      Previously seen
Ventricles:       Previously seen        Diaphragm:        Appears normal
Choroid Plexus:   Previously seen        Stomach:          Appears normal, left
sided
Cerebellum:       Previously seen        Abdomen:          Appears normal
Posterior Fossa:  Previously seen        Abdominal Wall:   Previously seen
Nuchal Fold:      Previously seen        Cord Vessels:     Appears normal (3
vessel cord)
Face:             Orbits and profile     Kidneys:          Bilat pyel, Rt
previously seen
7.8mm, Lt 10mm
Lips:             Previously seen        Bladder:          Appears normal
Fetal Thoracic:   Appears normal         Spine:            Previously seen
Heart:            Previously seen        Upper             Previously seen
Extremities:
RVOT:             Previously seen        Lower             Abnormal, see
Extremities:
comments
LVOT:             Previously seen

Other:  Fetus appears to be a male..
Cervix Uterus Adnexa

Cervix
Length:           3.87  cm.
Normal appearance by transabdominal scan.
Impression

SIUP at 31+2 weeks
Constrictionband around mid, right lower extremitiy c/w
amniotic band; no other sequelae of amniotic bands
identified; one of the hands did not open, therefore, fingers
could not be inspected; blood flow was documented in the
RLE below the stricture site; bilateral UTD A1 (pelves 8 and
10 mms)
All other interval fetal anatomy was seen and appeared
normal; anatomic survey complete
Normal amniotic fluid volume
Appropriate interval growth with EFW at the 48th %tile

The US findings were shared with Ms. Seishu and her husband.
I plan to call some fetal therapy centers to see if she would
be a candidate for in utero surgery.
Recommendations

Follow-up ultrasound in 3 weeks to reassess extremities

## 2016-10-19 NOTE — Progress Notes (Signed)
This encounter was created in error - please disregard.

## 2016-11-10 ENCOUNTER — Other Ambulatory Visit: Payer: Self-pay | Admitting: Physician Assistant

## 2016-12-03 ENCOUNTER — Other Ambulatory Visit: Payer: Self-pay

## 2016-12-03 MED ORDER — FLUCONAZOLE 150 MG PO TABS: 150.0000 mg | ORAL_TABLET | Freq: Once | ORAL | 0 refills | Status: AC

## 2017-02-08 ENCOUNTER — Other Ambulatory Visit: Payer: Self-pay | Admitting: Physician Assistant

## 2017-02-18 ENCOUNTER — Encounter: Payer: Self-pay | Admitting: Radiology

## 2017-03-30 ENCOUNTER — Other Ambulatory Visit: Payer: Self-pay | Admitting: Physician Assistant

## 2017-04-30 ENCOUNTER — Encounter: Payer: 59 | Admitting: Physician Assistant

## 2017-05-04 ENCOUNTER — Other Ambulatory Visit: Payer: Self-pay | Admitting: *Deleted

## 2017-05-04 MED ORDER — AMITRIPTYLINE HCL 50 MG PO TABS: 50.0000 mg | ORAL_TABLET | Freq: Every day | ORAL | 6 refills | Status: DC

## 2017-05-27 ENCOUNTER — Other Ambulatory Visit: Payer: Self-pay | Admitting: *Deleted

## 2017-05-31 ENCOUNTER — Other Ambulatory Visit: Payer: Self-pay

## 2017-05-31 DIAGNOSIS — Z3041 Encounter for surveillance of contraceptive pills: Secondary | ICD-10-CM

## 2017-05-31 MED ORDER — NORGESTIMATE-ETH ESTRADIOL 0.25-35 MG-MCG PO TABS: 1.0000 | ORAL_TABLET | Freq: Every day | ORAL | 2 refills | Status: DC

## 2017-05-31 NOTE — Telephone Encounter (Signed)
Refill on [facility]

## 2017-06-04 ENCOUNTER — Telehealth: Payer: Self-pay | Admitting: Radiology

## 2017-06-04 ENCOUNTER — Encounter (HOSPITAL_COMMUNITY): Payer: Self-pay | Admitting: *Deleted

## 2017-06-04 ENCOUNTER — Inpatient Hospital Stay (HOSPITAL_COMMUNITY)
Admission: AD | Admit: 2017-06-04 | Discharge: 2017-06-04 | Disposition: A | Discharge status: Home / Self Care | Payer: 59 | Source: Ambulatory Visit | Attending: Obstetrics & Gynecology | Admitting: Obstetrics & Gynecology

## 2017-06-04 ENCOUNTER — Other Ambulatory Visit: Payer: Self-pay

## 2017-06-04 DIAGNOSIS — N920 Excessive and frequent menstruation with regular cycle: Secondary | ICD-10-CM | POA: Diagnosis present

## 2017-06-04 DIAGNOSIS — N939 Abnormal uterine and vaginal bleeding, unspecified: Secondary | ICD-10-CM

## 2017-06-04 DIAGNOSIS — Z881 Allergy status to other antibiotic agents status: Secondary | ICD-10-CM | POA: Diagnosis not present

## 2017-06-04 DIAGNOSIS — Z3202 Encounter for pregnancy test, result negative: Secondary | ICD-10-CM

## 2017-06-04 DIAGNOSIS — Z882 Allergy status to sulfonamides status: Secondary | ICD-10-CM | POA: Insufficient documentation

## 2017-06-04 LAB — CBC
HEMATOCRIT: 38.8 % (ref 36.0–46.0)
HEMOGLOBIN: 12.8 g/dL (ref 12.0–15.0)
MCH: 28.7 pg (ref 26.0–34.0)
MCHC: 33 g/dL (ref 30.0–36.0)
MCV: 87 fL (ref 78.0–100.0)
Platelets: 335 10*3/uL (ref 150–400)
RBC: 4.46 MIL/uL (ref 3.87–5.11)
RDW: 13.3 % (ref 11.5–15.5)
WBC: 6.9 10*3/uL (ref 4.0–10.5)

## 2017-06-04 LAB — URINALYSIS, ROUTINE W REFLEX MICROSCOPIC
BILIRUBIN URINE: NEGATIVE
Bacteria, UA: NONE SEEN
Glucose, UA: NEGATIVE mg/dL
KETONES UR: NEGATIVE mg/dL
LEUKOCYTES UA: NEGATIVE
NITRITE: NEGATIVE
PROTEIN: NEGATIVE mg/dL
SQUAMOUS EPITHELIAL / LPF: NONE SEEN
Specific Gravity, Urine: 1.003 — ABNORMAL LOW (ref 1.005–1.030)
pH: 6 (ref 5.0–8.0)

## 2017-06-04 LAB — POCT PREGNANCY, URINE: Preg Test, Ur: NEGATIVE

## 2017-06-04 LAB — ABO/RH: ABO/RH(D): A POS

## 2017-06-04 LAB — HCG, QUANTITATIVE, PREGNANCY: hCG, Beta Chain, Quant, S: <1 m[IU]/mL (ref <5)

## 2017-06-04 MED ORDER — KETOROLAC TROMETHAMINE 60 MG/2ML IM SOLN
60.0000 mg | Freq: Once | INTRAMUSCULAR | Status: AC
  Administered 2017-06-04: 60 mg via INTRAMUSCULAR
  Filled 2017-06-04: qty 2

## 2017-06-04 MED ORDER — IBUPROFEN 800 MG PO TABS: 800.0000 mg | ORAL_TABLET | Freq: Three times a day (TID) | ORAL | 0 refills | Status: DC

## 2017-06-04 MED ORDER — MEGESTROL ACETATE 40 MG PO TABS: 40.0000 mg | ORAL_TABLET | Freq: Two times a day (BID) | ORAL | 0 refills | Status: AC

## 2017-06-04 NOTE — Discharge Instructions (Signed)
Abnormal Uterine Bleeding Abnormal uterine bleeding can affect women at various stages in life, including teenagers, women in their reproductive years, pregnant women, and women who have reached menopause. Several kinds of uterine bleeding are considered abnormal, including:  Bleeding or spotting between periods.  Bleeding after sexual intercourse.  Bleeding that is heavier or more than normal.  Periods that last longer than usual.  Bleeding after menopause. Many cases of abnormal uterine bleeding are minor and simple to treat, while others are more serious. Any type of abnormal bleeding should be evaluated by your health care provider. Treatment will depend on the cause of the bleeding. Follow these instructions at home: Monitor your condition for any changes. The following actions may help to alleviate any discomfort you are experiencing:  Avoid the use of tampons and douches as directed by your health care provider.  Change your pads frequently. You should get regular pelvic exams and Pap tests. Keep all follow-up appointments for diagnostic tests as directed by your health care provider. Contact a health care provider if:  Your bleeding lasts more than 1 week.  You feel dizzy at times. Get help right away if:  You pass out.  You are changing pads every 15 to 30 minutes.  You have abdominal pain.  You have a fever.  You become sweaty or weak.  You are passing large blood clots from the vagina.  You start to feel nauseous and vomit. This information is not intended to replace advice given to you by your health care provider. Make sure you discuss any questions you have with your health care provider. Document Released: 03/30/2005 Document Revised: 09/11/2015 Document Reviewed: 10/27/2012 Elsevier Interactive Patient Education  2017 [employer]  

## 2017-06-04 NOTE — MAU Provider Note (Signed)
History     CSN: [id number]  Arrival date and time: 06/04/17 1136   First Provider Initiated Contact with Patient 06/04/17 1419      Chief Complaint  Patient presents with  . Vaginal Bleeding  . Abdominal Pain  . Possible Pregnancy   HPI Sharon King is a 32 y.o. G4P0101 non pregnant female who presents with vaginal bleeding. She states in 4 hours she has soaked 4 super tampons and is passing large clots. She is concerned that she is having a miscarriage because of the amount of bleeding she is having. She is also having lower abdominal cramping that she rates a 10/10 and has not tried any pain medication for. She is currently taking Sprintec for birth control and has a history of irregular and heavy periods.  OB History    Gravida Para Term Preterm AB Living   1 1   1   1    SAB TAB Ectopic Multiple Live Births         0 1      Past Medical History:  Diagnosis Date  . History of preterm delivery   . Migraines   . Seizures ([place])    Last seizure 2014    Past Surgical History:  Procedure Laterality Date  . BREAST ENHANCEMENT SURGERY  2008  . CESAREAN SECTION N/A 06/13/2015   Procedure: CESAREAN SECTION;  Surgeon: Lavonia Drafts, MD;  Location: [facility];  Service: Obstetrics;  Laterality: N/A;  . wisdom teeth      Family History  Problem Relation Age of Onset  . Hyperlipidemia Mother   . Arthritis Mother   . Depression Mother     Social History   Tobacco Use  . Smoking status: Never Smoker  . Smokeless tobacco: Former Network engineer Use Topics  . Alcohol use: No    Comment: rare-stopped with pregnancy  . Drug use: No    Allergies:  Allergies  Allergen Reactions  . Erythromycin Rash  . Paroxetine Palpitations  . Sulfonamide Derivatives Rash    Medications Prior to Admission  Medication Sig Dispense Refill Last Dose  . ALPRAZolam (XANAX) 0.25 MG tablet TAKE 1 TABLET BY MOUTH AT BEDTIME AS NEEDED SLEEP  1 06/03/2017 at Unknown time  .  amitriptyline (ELAVIL) 50 MG tablet Take 1 tablet (50 mg total) by mouth at bedtime. 30 tablet 6 06/03/2017 at Unknown time  . diphenhydrAMINE (BENADRYL) 25 MG tablet Take 50 mg by mouth at bedtime as needed for sleep. Reported on 06/25/2015   06/03/2017 at Unknown time  . norgestimate-ethinyl estradiol (ORTHO-CYCLEN,SPRINTEC,PREVIFEM) 0.25-35 MG-MCG tablet Take 1 tablet by mouth daily. Take active pills in a continuous fashion. Have a withdrawal bleed once every 3-6 months 3 Package 2 06/03/2017 at Unknown time    Review of Systems  Constitutional: Negative.  Negative for fatigue and fever.  HENT: Negative.   Respiratory: Negative.  Negative for shortness of breath.   Cardiovascular: Negative.  Negative for chest pain.  Gastrointestinal: Positive for abdominal pain. Negative for constipation, diarrhea, nausea and vomiting.  Genitourinary: Positive for vaginal bleeding. Negative for dysuria.  Neurological: Negative.  Negative for dizziness and headaches.   Physical Exam   Blood pressure 134/87, pulse (!) 116, temperature 99 F (37.2 C), temperature source Oral, resp. rate 18, height 5\' 4"  (1.626 m), weight 159 lb 4 oz (72.2 kg), last menstrual period 04/07/2017, SpO2 100 %, not currently breastfeeding.  Physical Exam  Nursing note and vitals reviewed. Constitutional: She is oriented to  person, place, and time. She appears well-developed and well-nourished. No distress.  HENT:  Head: Normocephalic.  Eyes: Pupils are equal, round, and reactive to light.  Cardiovascular: Normal rate, regular rhythm and normal heart sounds.  Respiratory: Effort normal and breath sounds normal. No respiratory distress.  GI: Soft. Bowel sounds are normal. She exhibits no distension. There is no tenderness.  Neurological: She is alert and oriented to person, place, and time.  Skin: Skin is warm and dry.  Psychiatric: She has a normal mood and affect. Her behavior is normal. Judgment and thought content normal.     MAU Course  Procedures Results for orders placed or performed during the hospital encounter of 06/04/17 (from the past 24 hour(s))  Urinalysis, Routine w reflex microscopic     Status: Abnormal   Collection Time: 06/04/17 12:06 PM  Result Value Ref Range   Color, Urine STRAW (A) YELLOW   APPearance CLEAR CLEAR   Specific Gravity, Urine 1.003 (L) 1.005 - 1.030   pH 6.0 5.0 - 8.0   Glucose, UA NEGATIVE NEGATIVE mg/dL   Hgb urine dipstick MODERATE (A) NEGATIVE   Bilirubin Urine NEGATIVE NEGATIVE   Ketones, ur NEGATIVE NEGATIVE mg/dL   Protein, ur NEGATIVE NEGATIVE mg/dL   Nitrite NEGATIVE NEGATIVE   Leukocytes, UA NEGATIVE NEGATIVE   RBC / HPF 0-5 0 - 5 RBC/hpf   WBC, UA 0-5 0 - 5 WBC/hpf   Bacteria, UA NONE SEEN NONE SEEN   Squamous Epithelial / LPF NONE SEEN NONE SEEN  Pregnancy, urine POC     Status: None   Collection Time: 06/04/17 12:18 PM  Result Value Ref Range   Preg Test, Ur NEGATIVE NEGATIVE  CBC     Status: None   Collection Time: 06/04/17 12:48 PM  Result Value Ref Range   WBC 6.9 4.0 - 10.5 K/uL   RBC 4.46 3.87 - 5.11 MIL/uL   Hemoglobin 12.8 12.0 - 15.0 g/dL   HCT 38.8 36.0 - 46.0 %   MCV 87.0 78.0 - 100.0 fL   MCH 28.7 26.0 - 34.0 pg   MCHC 33.0 30.0 - 36.0 g/dL   RDW 13.3 11.5 - 15.5 %   Platelets 335 150 - 400 K/uL  hCG, quantitative, pregnancy     Status: None   Collection Time: 06/04/17 12:48 PM  Result Value Ref Range   hCG, Beta Chain, Quant, S <1 <5 mIU/mL  ABO/Rh     Status: None   Collection Time: 06/04/17 12:48 PM  Result Value Ref Range   ABO/RH(D)      A POS Performed at [facility], [place], [place] [address]    MDM UA UPT CBC, HCG, ABO/Rh Toradol 60mg  IM Patient refused pelvic exam after finding out she is not pregnant.   Assessment and Plan   1. Episode of heavy vaginal bleeding   2. Pregnancy examination or test, negative result    -Discharge home in stable condition -Rx for megace and ibuprofen sent  to patient's pharmacy -Vaginal bleeding precautions discussed -Patient advised to follow-up with [facility] as scheduled on 3/15 -Patient may return to MAU as needed or if her condition were to change or worsen  [place] 06/04/2017, 2:20 PM

## 2017-06-04 NOTE — MAU Note (Signed)
Not 100% positive, but is pretty sure she is having a miscarriage. About 2 days ago, started bleeding heavy.  10 superplus tampons while at work.  Cramping really bad last night.  Has gotten worse today, passing golfball sized clots.  Neg test 3 wks ago, has not repeated.

## 2017-06-04 NOTE — Telephone Encounter (Signed)
patient called stating that she was pretty sure that she was miscarrying while at work, states that she is saturating a super plus tampons every 30 minutes, states that bleeding has gotten worse since yesterday that she was changing her tampon about once every hour. Thinks that she is miscarrying because she was on a antibiotic and was not taking her BC. Explains that she is clotting and has had "blood run down her leg." I asked if she has had a positive pregnancy test and she replied no. I instructed the patient to go to [facility] to be triaged, that we do not have any appointments available today and that she needs to be examined. She states that she understands and will have to go once she gets off work. I told her to contact office to follow up if needed.

## 2017-06-07 ENCOUNTER — Telehealth: Payer: Self-pay | Admitting: Radiology

## 2017-06-07 NOTE — Telephone Encounter (Signed)
Called patient to schedule an appointment to f/u for AUB. Left message on the cell phone voicemail to call and schedule.

## 2017-06-25 ENCOUNTER — Encounter: Payer: 59 | Admitting: Physician Assistant

## 2017-06-25 ENCOUNTER — Ambulatory Visit: Payer: 59 | Admitting: Obstetrics and Gynecology

## 2017-07-06 ENCOUNTER — Encounter: Payer: Self-pay | Admitting: Obstetrics & Gynecology

## 2017-07-06 ENCOUNTER — Ambulatory Visit (INDEPENDENT_AMBULATORY_CARE_PROVIDER_SITE_OTHER): Payer: 59 | Admitting: Obstetrics & Gynecology

## 2017-07-06 VITALS — BP 114/79 | HR 72 | Wt 159.0 lb

## 2017-07-06 DIAGNOSIS — N921 Excessive and frequent menstruation with irregular cycle: Secondary | ICD-10-CM | POA: Diagnosis not present

## 2017-07-06 NOTE — Progress Notes (Signed)
Seen in the MAU for heavy bleeding on 06/04/2017 he to follow up.

## 2017-07-07 ENCOUNTER — Encounter: Payer: Self-pay | Admitting: Obstetrics & Gynecology

## 2017-07-07 NOTE — Progress Notes (Signed)
   GYNECOLOGY OFFICE VISIT NOTE  History:  32 y.o. G1P0101 here today for follow up after having an isolated episode of heavy breakthrough bleeding on Sprintec for which she was evaluated in the MAU.  This occurred after prolonged course of three different antibiotic regimens for sinusitis/URI.  She had negative evaluation in the MAU, was given Toradol and a course of Megace and Ibuprofen to take at home. This ameliorated the bleeding; no further episodes since then.  Still taking Sprintec for contraception, already had a normal withdrawal bleed since the event.   She denies any abnormal vaginal discharge, bleeding, pelvic pain or other concerns.   Past Medical History:  Diagnosis Date  . History of preterm delivery   . Migraines   . Seizures ([place])    Last seizure 2014    Past Surgical History:  Procedure Laterality Date  . BREAST ENHANCEMENT SURGERY  2008  . CESAREAN SECTION N/A 06/13/2015   Procedure: CESAREAN SECTION;  Surgeon: Lavonia Drafts, MD;  Location: [facility];  Service: Obstetrics;  Laterality: N/A;  . wisdom teeth      The following portions of the patient's history were reviewed and updated as appropriate: allergies, current medications, past family history, past medical history, past social history, past surgical history and problem list.   Health Maintenance:  Normal pap and negative HRHPV on 09/18/2016.   Review of Systems:  Pertinent items noted in HPI and remainder of comprehensive ROS otherwise negative.  Objective:  Physical Exam BP 114/79   Pulse 72   Wt 159 lb (72.1 kg)   LMP  (LMP Unknown)   BMI 27.29 kg/m  CONSTITUTIONAL: Well-developed, well-nourished female in no acute distress.  HENT:  Normocephalic, atraumatic. External right and left ear normal. Oropharynx is clear and moist EYES: Conjunctivae and EOM are normal. Pupils are equal, round, and reactive to light. No scleral icterus.  NECK: Normal range of motion, supple, no masses SKIN: Skin is  warm and dry. No rash noted. Not diaphoretic. No erythema. No pallor. NEUROLOGIC: Alert and oriented to person, place, and time. Normal reflexes, muscle tone coordination. No cranial nerve deficit noted. PSYCHIATRIC: Normal mood and affect. Normal behavior. Normal judgment and thought content. CARDIOVASCULAR: Normal heart rate noted RESPIRATORY: Effort and breath sounds normal, no problems with respiration noted ABDOMEN: Soft, no distention noted.   PELVIC: Deferred MUSCULOSKELETAL: Normal range of motion. No edema noted.  Labs and Imaging No results found.  Assessment & Plan:  1. Breakthrough bleeding on OCPs Episode has resolved, no further investigation is warranted Continue Sprintec as prescribed Patient told to call/follow up for any other bleeding episodes; also told to be cautious when taking any prolonged antibiotic therapy as this does reduce the efficacy of the OCPs and she will need back up contraception.  She may also have bleeding as she did last month, also due to the interference of the antibiotics on the OCPs.  Routine preventative health maintenance measures emphasized. Please refer to After Visit Summary for other counseling recommendations.   Return if symptoms worsen or fail to improve.   Total face-to-face time with patient: 10 minutes.  Over 50% of encounter was spent on counseling and coordination of care.   Verita Schneiders, MD, [place] for [employer], [place]

## 2017-07-07 NOTE — Patient Instructions (Signed)
Return to clinic for any scheduled appointments or for any gynecologic concerns as needed.   

## 2017-07-14 ENCOUNTER — Encounter: Payer: Self-pay | Admitting: Radiology

## 2017-08-06 ENCOUNTER — Encounter: Payer: 59 | Admitting: Physician Assistant

## 2017-08-19 ENCOUNTER — Other Ambulatory Visit: Payer: Self-pay | Admitting: Internal Medicine

## 2017-08-19 DIAGNOSIS — M25512 Pain in left shoulder: Secondary | ICD-10-CM

## 2017-08-19 DIAGNOSIS — M6780 Other specified disorders of synovium and tendon, unspecified site: Secondary | ICD-10-CM

## 2017-08-28 ENCOUNTER — Ambulatory Visit: Payer: 59

## 2017-09-07 ENCOUNTER — Other Ambulatory Visit: Payer: Self-pay | Admitting: Physician Assistant

## 2017-10-08 ENCOUNTER — Encounter: Payer: 59 | Admitting: Physician Assistant

## 2017-10-08 DIAGNOSIS — R51 Headache: Secondary | ICD-10-CM

## 2017-12-20 ENCOUNTER — Other Ambulatory Visit: Payer: Self-pay | Admitting: Physician Assistant

## 2017-12-24 ENCOUNTER — Ambulatory Visit: Payer: 59 | Admitting: Neurology

## 2018-01-03 ENCOUNTER — Encounter: Payer: 59 | Admitting: Neurology

## 2018-01-12 ENCOUNTER — Other Ambulatory Visit: Payer: Self-pay | Admitting: Physician Assistant

## 2018-01-12 ENCOUNTER — Other Ambulatory Visit: Payer: Self-pay | Admitting: Radiology

## 2018-01-12 NOTE — Telephone Encounter (Signed)
Please send to [facility] on [address]- patient going thru divorce and unable to sleep

## 2018-01-14 NOTE — Telephone Encounter (Signed)
Left message with patient that follow up is needed before refilling ambien prescription.  Crosby Oyster, RN

## 2018-02-18 ENCOUNTER — Encounter: Payer: Self-pay | Admitting: Physician Assistant

## 2018-02-18 ENCOUNTER — Ambulatory Visit (INDEPENDENT_AMBULATORY_CARE_PROVIDER_SITE_OTHER): Payer: Medicaid Other | Admitting: Physician Assistant

## 2018-02-18 DIAGNOSIS — G43909 Migraine, unspecified, not intractable, without status migrainosus: Secondary | ICD-10-CM

## 2018-02-18 DIAGNOSIS — Z23 Encounter for immunization: Secondary | ICD-10-CM | POA: Diagnosis not present

## 2018-02-18 DIAGNOSIS — G4700 Insomnia, unspecified: Secondary | ICD-10-CM

## 2018-02-18 DIAGNOSIS — M25512 Pain in left shoulder: Secondary | ICD-10-CM | POA: Diagnosis not present

## 2018-02-18 DIAGNOSIS — G8929 Other chronic pain: Secondary | ICD-10-CM

## 2018-02-18 MED ORDER — SUVOREXANT 10 MG PO TABS: 10.0000 mg | ORAL_TABLET | Freq: Every day | ORAL | 0 refills | Status: DC

## 2018-02-18 NOTE — Patient Instructions (Addendum)
We are sending in 10 mg belsomra. Will likely need prior auth.     Insomnia Insomnia is a sleep disorder that makes it difficult to fall asleep or to stay asleep. Insomnia can cause tiredness (fatigue), low energy, difficulty concentrating, mood swings, and poor performance at work or school. There are three different ways to classify insomnia:  Difficulty falling asleep.  Difficulty staying asleep.  Waking up too early in the morning.  Any type of insomnia can be long-term (chronic) or short-term (acute). Both are common. Short-term insomnia usually lasts for three months or less. Chronic insomnia occurs at least three times a week for longer than three months. What are the causes? Insomnia may be caused by another condition, situation, or substance, such as:  Anxiety.  Certain medicines.  Gastroesophageal reflux disease (GERD) or other gastrointestinal conditions.  Asthma or other breathing conditions.  Restless legs syndrome, sleep apnea, or other sleep disorders.  Chronic pain.  Menopause. This may include hot flashes.  Stroke.  Abuse of alcohol, tobacco, or illegal drugs.  Depression.  Caffeine.  Neurological disorders, such as Alzheimer disease.  An overactive thyroid (hyperthyroidism).  The cause of insomnia may not be known. What increases the risk? Risk factors for insomnia include:  Gender. Women are more commonly affected than men.  Age. Insomnia is more common as you get older.  Stress. This may involve your professional or personal life.  Income. Insomnia is more common in people with lower income.  Lack of exercise.  Irregular work schedule or night shifts.  Traveling between different time zones.  What are the signs or symptoms? If you have insomnia, trouble falling asleep or trouble staying asleep is the main symptom. This may lead to other symptoms, such as:  Feeling fatigued.  Feeling nervous about going to sleep.  Not feeling  rested in the morning.  Having trouble concentrating.  Feeling irritable, anxious, or depressed.  How is this treated? Treatment for insomnia depends on the cause. If your insomnia is caused by an underlying condition, treatment will focus on addressing the condition. Treatment may also include:  Medicines to help you sleep.  Counseling or therapy.  Lifestyle adjustments.  Follow these instructions at home:  Take medicines only as directed by your health care provider.  Keep regular sleeping and waking hours. Avoid naps.  Keep a sleep diary to help you and your health care provider figure out what could be causing your insomnia. Include: ? When you sleep. ? When you wake up during the night. ? How well you sleep. ? How rested you feel the next day. ? Any side effects of medicines you are taking. ? What you eat and drink.  Make your bedroom a comfortable place where it is easy to fall asleep: ? Put up shades or special blackout curtains to block light from outside. ? Use a white noise machine to block noise. ? Keep the temperature cool.  Exercise regularly as directed by your health care provider. Avoid exercising right before bedtime.  Use relaxation techniques to manage stress. Ask your health care provider to suggest some techniques that may work well for you. These may include: ? Breathing exercises. ? Routines to release muscle tension. ? Visualizing peaceful scenes.  Cut back on alcohol, caffeinated beverages, and cigarettes, especially close to bedtime. These can disrupt your sleep.  Do not overeat or eat spicy foods right before bedtime. This can lead to digestive discomfort that can make it hard for you to sleep.  Limit screen use before bedtime. This includes: ? Watching TV. ? Using your smartphone, tablet, and computer.  Stick to a routine. This can help you fall asleep faster. Try to do a quiet activity, brush your teeth, and go to bed at the same time each  night.  Get out of bed if you are still awake after 15 minutes of trying to sleep. Keep the lights down, but try reading or doing a quiet activity. When you feel sleepy, go back to bed.  Make sure that you drive carefully. Avoid driving if you feel very sleepy.  Keep all follow-up appointments as directed by your health care provider. This is important. Contact a health care provider if:  You are tired throughout the day or have trouble in your daily routine due to sleepiness.  You continue to have sleep problems or your sleep problems get worse. Get help right away if:  You have serious thoughts about hurting yourself or someone else. This information is not intended to replace advice given to you by your health care provider. Make sure you discuss any questions you have with your health care provider. Document Released: 03/27/2000 Document Revised: 08/30/2015 Document Reviewed: 12/29/2013 Elsevier Interactive Patient Education  2018 [employer]. Insomnia Insomnia is a sleep disorder that makes it difficult to fall asleep or to stay asleep. Insomnia can cause tiredness (fatigue), low energy, difficulty concentrating, mood swings, and poor performance at work or school. There are three different ways to classify insomnia:  Difficulty falling asleep.  Difficulty staying asleep.  Waking up too early in the morning.  Any type of insomnia can be long-term (chronic) or short-term (acute). Both are common. Short-term insomnia usually lasts for three months or less. Chronic insomnia occurs at least three times a week for longer than three months. What are the causes? Insomnia may be caused by another condition, situation, or substance, such as:  Anxiety.  Certain medicines.  Gastroesophageal reflux disease (GERD) or other gastrointestinal conditions.  Asthma or other breathing conditions.  Restless legs syndrome, sleep apnea, or other sleep disorders.  Chronic pain.  Menopause.  This may include hot flashes.  Stroke.  Abuse of alcohol, tobacco, or illegal drugs.  Depression.  Caffeine.  Neurological disorders, such as Alzheimer disease.  An overactive thyroid (hyperthyroidism).  The cause of insomnia may not be known. What increases the risk? Risk factors for insomnia include:  Gender. Women are more commonly affected than men.  Age. Insomnia is more common as you get older.  Stress. This may involve your professional or personal life.  Income. Insomnia is more common in people with lower income.  Lack of exercise.  Irregular work schedule or night shifts.  Traveling between different time zones.  What are the signs or symptoms? If you have insomnia, trouble falling asleep or trouble staying asleep is the main symptom. This may lead to other symptoms, such as:  Feeling fatigued.  Feeling nervous about going to sleep.  Not feeling rested in the morning.  Having trouble concentrating.  Feeling irritable, anxious, or depressed.  How is this treated? Treatment for insomnia depends on the cause. If your insomnia is caused by an underlying condition, treatment will focus on addressing the condition. Treatment may also include:  Medicines to help you sleep.  Counseling or therapy.  Lifestyle adjustments.  Follow these instructions at home:  Take medicines only as directed by your health care provider.  Keep regular sleeping and waking hours. Avoid naps.  Keep a sleep  diary to help you and your health care provider figure out what could be causing your insomnia. Include: ? When you sleep. ? When you wake up during the night. ? How well you sleep. ? How rested you feel the next day. ? Any side effects of medicines you are taking. ? What you eat and drink.  Make your bedroom a comfortable place where it is easy to fall asleep: ? Put up shades or special blackout curtains to block light from outside. ? Use a white noise machine to  block noise. ? Keep the temperature cool.  Exercise regularly as directed by your health care provider. Avoid exercising right before bedtime.  Use relaxation techniques to manage stress. Ask your health care provider to suggest some techniques that may work well for you. These may include: ? Breathing exercises. ? Routines to release muscle tension. ? Visualizing peaceful scenes.  Cut back on alcohol, caffeinated beverages, and cigarettes, especially close to bedtime. These can disrupt your sleep.  Do not overeat or eat spicy foods right before bedtime. This can lead to digestive discomfort that can make it hard for you to sleep.  Limit screen use before bedtime. This includes: ? Watching TV. ? Using your smartphone, tablet, and computer.  Stick to a routine. This can help you fall asleep faster. Try to do a quiet activity, brush your teeth, and go to bed at the same time each night.  Get out of bed if you are still awake after 15 minutes of trying to sleep. Keep the lights down, but try reading or doing a quiet activity. When you feel sleepy, go back to bed.  Make sure that you drive carefully. Avoid driving if you feel very sleepy.  Keep all follow-up appointments as directed by your health care provider. This is important. Contact a health care provider if:  You are tired throughout the day or have trouble in your daily routine due to sleepiness.  You continue to have sleep problems or your sleep problems get worse. Get help right away if:  You have serious thoughts about hurting yourself or someone else. This information is not intended to replace advice given to you by your health care provider. Make sure you discuss any questions you have with your health care provider. Document Released: 03/27/2000 Document Revised: 08/30/2015 Document Reviewed: 12/29/2013 Elsevier Interactive Patient Education  [employer].

## 2018-02-18 NOTE — Progress Notes (Signed)
Patient: Sharon King, Female    DOB: 05/18/1985, 32 y.o.   MRN: [id number] Visit Date: 03/04/2018  Today's Provider: Trinna Post, PA-C   Chief Complaint  Patient presents with  . New Patient (Initial Visit)   Subjective:    Annual physical exam Sharon King is a 32 y.o. female who presents today to establish care.  Patient here today C/O lower back, reports pain radiates to left arm. Patient reports pain and numbness worse at night and does not let her sleep.   Lives in home with friend  Has 21 yeard old son Previously Education administrator but now works at [employer]  PAP smear up to date Flu shot today Separated -requests release info only to her and not her husband  After she had emergency c-section - placed epidural and patient reports her back has been going numb, has had two MRI with [facility], shoulder MRI and c-spine were normal - had a steroid injection, oral dose of steroids - without relief. Has upcoming nerve conduction test at the end of this month with neurologist Dr. Jannifer Franklin.   She is currently taking gabapentin for her shoulder, elavil for migraines, zanaflex for muscle spasms.   Her main concern today is insomnia. She feels she cannot sleep at night since this event with her shoulder. Has been on ambien before which she describes as overly sedating.   -----------------------------------------------------------------   Review of Systems  Constitutional: Negative.   HENT: Negative.   Respiratory: Positive for cough.   Cardiovascular: Negative.   Gastrointestinal: Negative.   Endocrine: Positive for heat intolerance.  Genitourinary: Negative.   Musculoskeletal: Positive for back pain and neck pain.  Skin: Negative.   Allergic/Immunologic: Positive for environmental allergies.  Neurological: Positive for headaches.  Hematological: Negative.   Psychiatric/Behavioral: Positive for sleep disturbance. The patient is  nervous/anxious.     Social History      She  reports that she has never smoked. She has quit using smokeless tobacco. She reports that she does not drink alcohol or use drugs.       Social History   Socioeconomic History  . Marital status: Legally Separated    Spouse name: Not on file  . Number of children: Not on file  . Years of education: Not on file  . Highest education level: Not on file  Occupational History  . Not on file  Social Needs  . Financial resource strain: Not on file  . Food insecurity:    Worry: Not on file    Inability: Not on file  . Transportation needs:    Medical: Not on file    Non-medical: Not on file  Tobacco Use  . Smoking status: Never Smoker  . Smokeless tobacco: Former Network engineer and Sexual Activity  . Alcohol use: No    Comment: rare-stopped with pregnancy  . Drug use: No  . Sexual activity: Yes    Birth control/protection: Pill  Lifestyle  . Physical activity:    Days per week: Not on file    Minutes per session: Not on file  . Stress: Not on file  Relationships  . Social connections:    Talks on phone: Not on file    Gets together: Not on file    Attends religious service: Not on file    Active member of club or organization: Not on file    Attends meetings of clubs or organizations: Not on file  Relationship status: Not on file  Other Topics Concern  . Not on file  Social History Narrative  . Not on file    Past Medical History:  Diagnosis Date  . Allergy   . Anxiety   . Asthma   . Cancer ([place])   . GERD (gastroesophageal reflux disease)   . History of preterm delivery   . Migraines   . Neuromuscular disorder ([place])   . Seizures ([place])    Last seizure 2014  . Sleep apnea   . Trigeminal neuralgia      Patient Active Problem List   Diagnosis Date Noted  . Left shoulder pain 03/04/2018  . Muscle spasm 02/18/2016  . ALLERGIC RHINITIS 04/22/2007  . ASTHMA 11/06/2006  . Migraines 11/06/2006    Past Surgical  History:  Procedure Laterality Date  . BREAST ENHANCEMENT SURGERY  2008  . BREAST SURGERY    . CESAREAN SECTION N/A 06/13/2015   Procedure: CESAREAN SECTION;  Surgeon: Lavonia Drafts, MD;  Location: [facility];  Service: Obstetrics;  Laterality: N/A;  . COSMETIC SURGERY    . wisdom teeth      Family History        Family Status  Relation Name Status  . Mother  Alive  . Father  Alive        Her family history includes Alcohol abuse in her father; Arthritis in her father and mother; Depression in her father and mother; Hyperlipidemia in her mother.      Allergies  Allergen Reactions  . Erythromycin Rash  . Paroxetine Palpitations  . Sulfonamide Derivatives Rash     Current Outpatient Medications:  .  amitriptyline (ELAVIL) 50 MG tablet, TAKE 1 TABLET BY MOUTH EVERYDAY AT BEDTIME (Patient not taking: Reported on 02/22/2018), Disp: 90 tablet, Rfl: 0 .  gabapentin (NEURONTIN) 300 MG capsule, gabapentin 300 mg capsule  TAKE 1 CAPSULE BY MOUTH AT BEDTIME FOR 3 DAYS, THEN 1 CAPSULE TWICE A DAY THEN 1 CAP 3 TIMES A DAY, Disp: , Rfl:  .  norgestimate-ethinyl estradiol (ORTHO-CYCLEN,SPRINTEC,PREVIFEM) 0.25-35 MG-MCG tablet, Take 1 tablet by mouth daily. Take active pills in a continuous fashion. Have a withdrawal bleed once every 3-6 months, Disp: 3 Package, Rfl: 2 .  tiZANidine (ZANAFLEX) 4 MG tablet, TAKE 1.5 TABLETS (6 MG TOTAL) BY MOUTH 3 (THREE) TIMES DAILY AS NEEDED FOR MUSCLE SPASMS, Disp: , Rfl: 0 .  Suvorexant (BELSOMRA) 10 MG TABS, Take 10 mg by mouth at bedtime. (Patient not taking: Reported on 02/22/2018), Disp: 90 tablet, Rfl: 0   Patient Care Team: Paulene Floor as PCP - General (Physician Assistant) Susa Day, MD as Consulting Physician (Orthopedic Surgery)      Objective:   Vitals: There were no vitals taken for this visit.  There were no vitals filed for this visit.   Physical Exam  Constitutional: She is oriented to person, place, and time. She  appears well-developed and well-nourished.  Cardiovascular: Normal rate and regular rhythm.  Pulmonary/Chest: Effort normal and breath sounds normal.  Neurological: She is alert and oriented to person, place, and time.  Skin: Skin is warm and dry.  Psychiatric: She has a normal mood and affect. Her behavior is normal.     Depression Screen PHQ 2/9 Scores 02/18/2018 06/07/2015 05/10/2015 04/12/2015  PHQ - 2 Score 0 0 0 0  PHQ- 9 Score 1 - - -      Assessment & Plan:     Routine Health Maintenance and Physical Exam  Exercise  Activities and Dietary recommendations Goals   None     Immunization History  Administered Date(s) Administered  . Influenza Whole 04/14/2005  . Influenza,inj,Quad PF,6+ Mos 12/13/2014  . Tdap 05/15/2015    Health Maintenance  Topic Date Due  . INFLUENZA VACCINE  11/11/2017  . PAP SMEAR  09/19/2019  . TETANUS/TDAP  05/14/2025  . HIV Screening  Completed     Discussed health benefits of physical activity, and encouraged her to engage in regular exercise appropriate for her age and condition.    1. Insomnia, unspecified type  Trazodone interacts with current medication. Will try as below.  - Suvorexant (BELSOMRA) 10 MG TABS; Take 10 mg by mouth at bedtime. (Patient not taking: Reported on 02/22/2018)  Dispense: 90 tablet; Refill: 0  2. Need for influenza vaccination  - Flu Vaccine QUAD 36+ mos IM  3. Migraine without status migrainosus, not intractable, unspecified migraine type  Currently taking elavil.  4. Chronic left shoulder pain  She has had extensive workup for this. EMG planned with neurology.  Return in about 1 month (around 03/20/2018).  The entirety of the information documented in the History of Present Illness, Review of Systems and Physical Exam were personally obtained by me. Portions of this information were initially documented by Jacquelin Hawking, CMA and reviewed by me for thoroughness and accuracy.     --------------------------------------------------------------------    Trinna Post, PA-C  [facility]

## 2018-02-22 ENCOUNTER — Other Ambulatory Visit: Payer: Self-pay

## 2018-02-22 ENCOUNTER — Encounter: Payer: Self-pay | Admitting: Neurology

## 2018-02-22 ENCOUNTER — Ambulatory Visit: Payer: Medicaid Other | Admitting: Neurology

## 2018-02-22 VITALS — BP 141/94 | HR 96 | Resp 14 | Ht 64.0 in | Wt 155.0 lb

## 2018-02-22 DIAGNOSIS — M79601 Pain in right arm: Secondary | ICD-10-CM

## 2018-02-22 NOTE — Progress Notes (Signed)
Reason for visit: Left arm pain  Referring physician: Dr. Charlett Nose Sharon King is a 32 y.o. female  History of present illness:  Sharon King is a 32 year old right-handed white female with a history of spontaneous onset of left neck and shoulder and arm discomfort that began in May 2019.  The patient woke up with the problems, she has had persistent symptoms that have been actually worsened recently.  The patient reports pain going down the arm to the hand, she has numbness going down the back of the upper arm and into the forearm and into the hand with tingling and discomfort.  The patient has weakness of the left arm.  She reports no symptoms involving the right arm or either lower extremity.  She has stiffness of the neck, she has difficulty turning the head to the left.  She denies any pain going down the left arm with head movements.  The patient denies any balance changes or difficulty controlling the bowels or the bladder.  The patient has undergone MRI evaluation of the left shoulder and of the cervical spine without any definite etiology of her symptoms being delineated.  The patient claims that she has chronic daily headaches, she has had chronic neck stiffness with her headaches.  She has had headaches since she was 32 years old.  The patient also reports a history of "seizures".  The description of the seizures sounds like cramping of the hands bilaterally that may occur without loss of consciousness whatsoever.  The patient is on gabapentin, she is ramping up on the dose currently, she takes amitriptyline.  She has not yet gone through any physical therapy on her neck or shoulders.  She is set up for EMG nerve conduction study to be done through this office.  Past Medical History:  Diagnosis Date  . Allergy   . Anxiety   . Asthma   . Cancer ([place])   . GERD (gastroesophageal reflux disease)   . History of preterm delivery   . Migraines   . Neuromuscular disorder ([place])   .  Seizures ([place])    Last seizure 2014  . Sleep apnea   . Trigeminal neuralgia     Past Surgical History:  Procedure Laterality Date  . BREAST ENHANCEMENT SURGERY  2008  . BREAST SURGERY    . CESAREAN SECTION N/A 06/13/2015   Procedure: CESAREAN SECTION;  Surgeon: Lavonia Drafts, MD;  Location: [facility];  Service: Obstetrics;  Laterality: N/A;  . COSMETIC SURGERY    . wisdom teeth      Family History  Problem Relation Age of Onset  . Hyperlipidemia Mother   . Arthritis Mother   . Depression Mother   . Arthritis Father   . Depression Father   . Alcohol abuse Father     Social history:  reports that she has never smoked. She has quit using smokeless tobacco. She reports that she does not drink alcohol or use drugs.  Medications:  Prior to Admission medications   Medication Sig Start Date End Date Taking? Authorizing Provider  gabapentin (NEURONTIN) 300 MG capsule gabapentin 300 mg capsule  TAKE 1 CAPSULE BY MOUTH AT BEDTIME FOR 3 DAYS, THEN 1 CAPSULE TWICE A DAY THEN 1 CAP 3 TIMES A DAY   Yes [provider]  norgestimate-ethinyl estradiol (ORTHO-CYCLEN,SPRINTEC,PREVIFEM) 0.25-35 MG-MCG tablet Take 1 tablet by mouth daily. Take active pills in a continuous fashion. Have a withdrawal bleed once every 3-6 months 05/31/17  Yes Dove, Myra  C, MD  tiZANidine (ZANAFLEX) 4 MG tablet TAKE 1.5 TABLETS (6 MG TOTAL) BY MOUTH 3 (THREE) TIMES DAILY AS NEEDED FOR MUSCLE SPASMS 01/27/18  Yes [provider]  amitriptyline (ELAVIL) 50 MG tablet TAKE 1 TABLET BY MOUTH EVERYDAY AT BEDTIME Patient not taking: Reported on 02/22/2018 01/14/18   Jaclyn Prime, Collene Leyden, PA-C  Suvorexant (BELSOMRA) 10 MG TABS Take 10 mg by mouth at bedtime. Patient not taking: Reported on 02/22/2018 02/18/18 05/19/18  Trinna Post, PA-C      Allergies  Allergen Reactions  . Erythromycin Rash  . Paroxetine Palpitations  . Sulfonamide Derivatives Rash    ROS:  Out of a complete 14 system  review of symptoms, the patient complains only of the following symptoms, and all other reviewed systems are negative.  Headache, numbness, seizure events Anxiety, not enough sleep Insomnia  Blood pressure (!) 141/94, pulse 96, resp. rate 14, height 5\' 4"  (1.626 m), weight 155 lb (70.3 kg).  Physical Exam  General: The patient is alert and cooperative at the time of the examination.  Eyes: Pupils are equal, round, and reactive to light. Discs are flat bilaterally.  Neck: The neck is supple, no carotid bruits are noted.  Respiratory: The respiratory examination is clear.  Cardiovascular: The cardiovascular examination reveals a regular rate and rhythm, no obvious murmurs or rubs are noted.  Neuromuscular: Range of movement the cervical spine is full when looking to the right, lacks about 20 degrees and looking to the left.  Skin: Extremities are without significant edema.  Neurologic Exam  Mental status: The patient is alert and oriented x 3 at the time of the examination. The patient has apparent normal recent and remote memory, with an apparently normal attention span and concentration ability.  Cranial nerves: Facial symmetry is present. There is good sensation of the face to pinprick and soft touch bilaterally. The strength of the facial muscles and the muscles to head turning and shoulder shrug are normal bilaterally. Speech is well enunciated, no aphasia or dysarthria is noted. Extraocular movements are full. Visual fields are full. The tongue is midline, and the patient has symmetric elevation of the soft palate. No obvious hearing deficits are noted.  Motor: The motor testing reveals 5 over 5 strength of all 4 extremities. Good symmetric motor tone is noted throughout.  Sensory: Sensory testing is intact to pinprick, soft touch, vibration sensation, and position sense on all 4 extremities, with exception some decrease in vibration sensation and pinprick sensation on the left  arm. No evidence of extinction is noted.  Coordination: Cerebellar testing reveals good finger-nose-finger and heel-to-shin bilaterally.  Gait and station: Gait is normal. Tandem gait is normal. Romberg is negative. No drift is seen.  Reflexes: Deep tendon reflexes are symmetric and normal bilaterally. Toes are downgoing bilaterally.   Assessment/Plan:  1.  Left arm pain and paresthesias  2.  Chronic daily headache, migraine  The etiology of her current symptoms is not clear.  EMG and nerve conduction study has been set up, the patient will be ramping up on the dose of gabapentin for the pain.  Neuromuscular therapy in the future may be reasonable.  Jill Alexanders MD 02/22/2018 11:42 AM  [facility] [address] [place], [place] [address]  Phone [phone] Fax [phone]

## 2018-02-28 ENCOUNTER — Encounter: Payer: Medicaid Other | Admitting: Neurology

## 2018-02-28 ENCOUNTER — Telehealth: Payer: Self-pay | Admitting: Neurology

## 2018-02-28 ENCOUNTER — Encounter: Payer: Self-pay | Admitting: Neurology

## 2018-02-28 NOTE — Telephone Encounter (Signed)
This patient did not show for an EMG and nerve conduction study evaluation today. 

## 2018-03-04 DIAGNOSIS — M25512 Pain in left shoulder: Secondary | ICD-10-CM | POA: Insufficient documentation

## 2018-03-21 ENCOUNTER — Ambulatory Visit: Payer: Self-pay | Admitting: Physician Assistant

## 2018-03-23 ENCOUNTER — Encounter: Payer: Self-pay | Admitting: Advanced Practice Midwife

## 2018-03-23 ENCOUNTER — Ambulatory Visit (INDEPENDENT_AMBULATORY_CARE_PROVIDER_SITE_OTHER): Payer: Medicaid Other | Admitting: Advanced Practice Midwife

## 2018-03-23 DIAGNOSIS — Z0000 Encounter for general adult medical examination without abnormal findings: Secondary | ICD-10-CM | POA: Diagnosis not present

## 2018-03-23 DIAGNOSIS — Z3041 Encounter for surveillance of contraceptive pills: Secondary | ICD-10-CM

## 2018-03-23 MED ORDER — ZOLPIDEM TARTRATE 5 MG PO TABS: 5.0000 mg | ORAL_TABLET | Freq: Every evening | ORAL | 1 refills | Status: DC | PRN

## 2018-03-23 MED ORDER — NORGESTIMATE-ETH ESTRADIOL 0.25-35 MG-MCG PO TABS: 1.0000 | ORAL_TABLET | Freq: Every day | ORAL | 2 refills | Status: DC

## 2018-03-23 MED ORDER — BUTALBITAL-ASA-CAFF-CODEINE 50-325-40-30 MG PO CAPS: 1.0000 | ORAL_CAPSULE | ORAL | 0 refills | Status: AC | PRN

## 2018-03-23 NOTE — Patient Instructions (Signed)
Exercising to Stay Healthy Exercising regularly is important. It has many health benefits, such as:  Improving your overall fitness, flexibility, and endurance.  Increasing your bone density.  Helping with weight control.  Decreasing your body fat.  Increasing your muscle strength.  Reducing stress and tension.  Improving your overall health.  In order to become healthy and stay healthy, it is recommended that you do moderate-intensity and vigorous-intensity exercise. You can tell that you are exercising at a moderate intensity if you have a higher heart rate and faster breathing, but you are still able to hold a conversation. You can tell that you are exercising at a vigorous intensity if you are breathing much harder and faster and cannot hold a conversation while exercising. How often should I exercise? Choose an activity that you enjoy and set realistic goals. Your health care provider can help you to make an activity plan that works for you. Exercise regularly as directed by your health care provider. This may include:  Doing resistance training twice each week, such as: ? Push-ups. ? Sit-ups. ? Lifting weights. ? Using resistance bands.  Doing a given intensity of exercise for a given amount of time. Choose from these options: ? 150 minutes of moderate-intensity exercise every week. ? 75 minutes of vigorous-intensity exercise every week. ? A mix of moderate-intensity and vigorous-intensity exercise every week.  Children, pregnant women, people who are out of shape, people who are overweight, and older adults may need to consult a health care provider for individual recommendations. If you have any sort of medical condition, be sure to consult your health care provider before starting a new exercise program. What are some exercise ideas? Some moderate-intensity exercise ideas include:  Walking at a rate of 1 mile in 15  minutes.  Biking.  Hiking.  Golfing.  Dancing.  Some vigorous-intensity exercise ideas include:  Walking at a rate of at least 4.5 miles per hour.  Jogging or running at a rate of 5 miles per hour.  Biking at a rate of at least 10 miles per hour.  Lap swimming.  Roller-skating or in-line skating.  Cross-country skiing.  Vigorous competitive sports, such as football, basketball, and soccer.  Jumping rope.  Aerobic dancing.  What are some everyday activities that can help me to get exercise?  Yard work, such as: ? Pushing a lawn mower. ? Raking and bagging leaves.  Washing and waxing your car.  Pushing a stroller.  Shoveling snow.  Gardening.  Washing windows or floors. How can I be more active in my day-to-day activities?  Use the stairs instead of the elevator.  Take a walk during your lunch break.  If you drive, park your car farther away from work or school.  If you take public transportation, get off one stop early and walk the rest of the way.  Make all of your phone calls while standing up and walking around.  Get up, stretch, and walk around every 30 minutes throughout the day. What guidelines should I follow while exercising?  Do not exercise so much that you hurt yourself, feel dizzy, or get very short of breath.  Consult your health care provider before starting a new exercise program.  Wear comfortable clothes and shoes with good support.  Drink plenty of water while you exercise to prevent dehydration or heat stroke. Body water is lost during exercise and must be replaced.  Work out until you breathe faster and your heart beats faster. This information is not   intended to replace advice given to you by your health care provider. Make sure you discuss any questions you have with your health care provider. Document Released: 05/02/2010 Document Revised: 09/05/2015 Document Reviewed: 08/31/2013 Elsevier Interactive Patient Education  2018  Elsevier Inc.  

## 2018-03-23 NOTE — Progress Notes (Signed)
GYNECOLOGY ANNUAL PREVENTATIVE CARE ENCOUNTER NOTE  Subjective:   Sharon King is a 32 y.o. G30P0101 female here for a routine annual gynecologic exam.  Current complaints: none.   Denies abnormal vaginal bleeding, discharge, pelvic pain, problems with intercourse or other gynecologic concerns.    Patient is unable to see Sharon King until January. She is requesting refills of her Ambien and migraine medicine today.  Gynecologic History Patient's last menstrual period was 03/08/2018 (within days). Contraception: OCP (estrogen/progesterone) Last Pap: 09/18/2016. Results were: normal with negative HPV Last mammogram: N/A age 29  Obstetric History OB History  Gravida Para Term Preterm AB Living  1 1   1   1   SAB TAB Ectopic Multiple Live Births        0 1    # Outcome Date GA Lbr Len/2nd Weight Sex Delivery Anes PTL Lv  1 Preterm 06/13/15 [redacted]w[redacted]d  4 lb 5.5 oz (1.97 kg) M CS-LTranv Spinal       Birth Comments: RLE amniotic band (not cicumferential)    Past Medical History:  Diagnosis Date  . Allergy   . Anxiety   . Asthma   . Cancer ([place])   . GERD (gastroesophageal reflux disease)   . History of preterm delivery   . Migraines   . Neuromuscular disorder ([place])   . Seizures ([place])    Last seizure 2014  . Sleep apnea   . Trigeminal neuralgia     Past Surgical History:  Procedure Laterality Date  . BREAST ENHANCEMENT SURGERY  2008  . BREAST SURGERY    . CESAREAN SECTION N/A 06/13/2015   Procedure: CESAREAN SECTION;  Surgeon: Sharon Drafts, MD;  Location: [facility];  Service: Obstetrics;  Laterality: N/A;  . COSMETIC SURGERY    . wisdom teeth      Current Outpatient Medications on File Prior to Visit  Medication Sig Dispense Refill  . amitriptyline (ELAVIL) 50 MG tablet TAKE 1 TABLET BY MOUTH EVERYDAY AT BEDTIME 90 tablet 0  . gabapentin (NEURONTIN) 300 MG capsule gabapentin 300 mg capsule  TAKE 1 CAPSULE BY MOUTH AT BEDTIME FOR 3 DAYS, THEN 1  CAPSULE TWICE A DAY THEN 1 CAP 3 TIMES A DAY    . tiZANidine (ZANAFLEX) 4 MG tablet TAKE 1.5 TABLETS (6 MG TOTAL) BY MOUTH 3 (THREE) TIMES DAILY AS NEEDED FOR MUSCLE SPASMS  0  . Suvorexant (BELSOMRA) 10 MG TABS Take 10 mg by mouth at bedtime. (Patient not taking: Reported on 02/22/2018) 90 tablet 0   No current facility-administered medications on file prior to visit.     Allergies  Allergen Reactions  . Erythromycin Rash  . Paroxetine Palpitations  . Sulfonamide Derivatives Rash    Social History:  reports that she has never smoked. She has quit using smokeless tobacco. She reports that she does not drink alcohol or use drugs.  Family History  Problem Relation Age of Onset  . Hyperlipidemia Mother   . Arthritis Mother   . Depression Mother   . Arthritis Father   . Depression Father   . Alcohol abuse Father     The following portions of the patient's history were reviewed and updated as appropriate: allergies, current medications, past family history, past medical history, past social history, past surgical history and problem list.  Review of Systems Pertinent items noted in HPI and remainder of comprehensive ROS otherwise negative.   Objective:  BP (!) 141/99   Pulse 93   Ht 5\' 4"  (1.626  m)   Wt 148 lb (67.1 kg)   LMP 03/08/2018 (Within Days)   BMI 25.40 kg/m  CONSTITUTIONAL: Well-developed, well-nourished female in no acute distress.  HENT:  Normocephalic, atraumatic, External right and left ear normal. Oropharynx is clear and moist EYES: Conjunctivae and EOM are normal. Pupils are equal, round, and reactive to light. No scleral icterus.  NECK: Normal range of motion, supple, no masses.  Normal thyroid.  SKIN: Skin is warm and dry. No rash noted. Not diaphoretic. No erythema. No pallor. MUSCULOSKELETAL: Normal range of motion. No tenderness.  No cyanosis, clubbing, or edema.  2+ distal pulses. NEUROLOGIC: Alert and oriented to person, place, and time. Normal reflexes,  muscle tone coordination. No cranial nerve deficit noted. PSYCHIATRIC: Normal mood and affect. Normal behavior. Normal judgment and thought content. CARDIOVASCULAR: Normal heart rate noted, regular rhythm RESPIRATORY: Clear to auscultation bilaterally. Effort and breath sounds normal, no problems with respiration noted. BREASTS: Symmetric in size. No masses, skin changes, nipple drainage, or lymphadenopathy. ABDOMEN: Soft, normal bowel sounds, no distention noted.  No tenderness, rebound or guarding.  PELVIC: Declined  Assessment and Plan:  1. Encounter for surveillance of contraceptive pills - Declined pap today - Discussed health maintenance including recommendations for exercise - Refilled OCP, Ambien and short course of Fiorinal with Codeine due to gap until she can be seen by Provider for migraine  Routine preventative health maintenance measures emphasized. Please refer to After Visit Summary for other counseling recommendations.    Sharon King, [place] for [employer], [place]

## 2018-03-24 ENCOUNTER — Ambulatory Visit (INDEPENDENT_AMBULATORY_CARE_PROVIDER_SITE_OTHER): Payer: Medicaid Other | Admitting: Physician Assistant

## 2018-03-24 ENCOUNTER — Encounter: Payer: Self-pay | Admitting: Physician Assistant

## 2018-03-24 VITALS — BP 136/87 | HR 99 | Temp 98.7°F | Wt 148.4 lb

## 2018-03-24 DIAGNOSIS — M62838 Other muscle spasm: Secondary | ICD-10-CM

## 2018-03-24 MED ORDER — TIZANIDINE HCL 4 MG PO CAPS: ORAL_CAPSULE | ORAL | 0 refills | Status: DC

## 2018-03-24 NOTE — Progress Notes (Signed)
Patient: Sharon King Female    DOB: 05/19/1985   32 y.o.   MRN: [id number] Visit Date: 03/24/2018  Today's Provider: Trinna Post, PA-C   Chief Complaint  Patient presents with  . Insomnia   Subjective:     HPI  Insomnia Follow up  She presents today for follow up of insomnia. She was last seen for this 1 months ago. Management changes included ob-gyn prescribed Ambien 5 mg due to patient not be able to get Belsomra filled. She is not having adverse reaction from treatment.  Insomnia is getting unchanged. She has difficulty FALLING asleep. She has difficulty STAYING asleep.  She is not taking stimulant medications.  She is not taking new medications:  She is not taking OTC sleeping aid. . She is not taking medications to help sleep. She is not drinking alcohol to help sleep. She is not using illicit drugs.  PA for belsomra was denied. She has gotten Ambien from OBGYn which she intends to use sparingly. Unable to attend [facility] due to work conflict, she plans on rescheduling this. Still has pain in her arm.  --------------------------------------------------------------------   Allergies  Allergen Reactions  . Erythromycin Rash  . Paroxetine Palpitations  . Sulfonamide Derivatives Rash     Current Outpatient Medications:  .  amitriptyline (ELAVIL) 50 MG tablet, TAKE 1 TABLET BY MOUTH EVERYDAY AT BEDTIME, Disp: 90 tablet, Rfl: 0 .  butalbital-aspirin-caffeine-codeine (FIORINAL WITH CODEINE) 50-325-40-30 MG capsule, Take 1 capsule by mouth every 4 (four) hours as needed for up to 5 days for pain., Disp: 30 capsule, Rfl: 0 .  gabapentin (NEURONTIN) 300 MG capsule, gabapentin 300 mg capsule  TAKE 1 CAPSULE BY MOUTH AT BEDTIME FOR 3 DAYS, THEN 1 CAPSULE TWICE A DAY THEN 1 CAP 3 TIMES A DAY, Disp: , Rfl:  .  norgestimate-ethinyl estradiol (ORTHO-CYCLEN,SPRINTEC,PREVIFEM) 0.25-35 MG-MCG tablet, Take 1 tablet by mouth daily. Take active pills in a continuous  fashion. Have a withdrawal bleed once every 3-6 months, Disp: 3 Package, Rfl: 2 .  tiZANidine (ZANAFLEX) 4 MG tablet, TAKE 1.5 TABLETS (6 MG TOTAL) BY MOUTH 3 (THREE) TIMES DAILY AS NEEDED FOR MUSCLE SPASMS, Disp: , Rfl: 0 .  zolpidem (AMBIEN) 5 MG tablet, Take 1 tablet (5 mg total) by mouth at bedtime as needed for sleep., Disp: 30 tablet, Rfl: 1 .  Suvorexant (BELSOMRA) 10 MG TABS, Take 10 mg by mouth at bedtime. (Patient not taking: Reported on 02/22/2018), Disp: 90 tablet, Rfl: 0  Review of Systems  Constitutional: Negative.   HENT: Negative.   Cardiovascular: Negative.   Psychiatric/Behavioral: Positive for sleep disturbance.    Social History   Tobacco Use  . Smoking status: Never Smoker  . Smokeless tobacco: Former Network engineer Use Topics  . Alcohol use: No    Comment: rare-stopped with pregnancy      Objective:   BP 136/87 (BP Location: Left Arm, Patient Position: Sitting, Cuff Size: Normal)   Pulse 99   Temp 98.7 F (37.1 C) (Oral)   Wt 148 lb 6.4 oz (67.3 kg)   LMP 03/08/2018 (Within Days)   SpO2 99%   BMI 25.47 kg/m  Vitals:   03/24/18 1527  BP: 136/87  Pulse: 99  Temp: 98.7 F (37.1 C)  TempSrc: Oral  SpO2: 99%  Weight: 148 lb 6.4 oz (67.3 kg)     Physical Exam Constitutional:      Appearance: Normal appearance. She is normal weight.  Skin:  General: Skin is warm and dry.  Neurological:     Mental Status: She is oriented to person, place, and time. Mental status is at baseline.  Psychiatric:        Mood and Affect: Mood normal.        Behavior: Behavior normal.         Assessment & Plan    1. Muscle spasm  She has had ambien refilled by obgyn. She requests zanaflex for muscle spasms and headaches. Counseled not to mix with Ambien due to risk of oversedation.   - tiZANidine (ZANAFLEX) 4 MG capsule; Take two tablets at bed time.  Dispense: 60 capsule; Refill: 0  Return if symptoms worsen or fail to improve.  The entirety of the  information documented in the History of Present Illness, Review of Systems and Physical Exam were personally obtained by me. Portions of this information were initially documented by Hurman Horn, CMA and reviewed by me for thoroughness and accuracy.          Trinna Post, PA-C  [facility]

## 2018-03-24 NOTE — Patient Instructions (Signed)

## 2018-04-29 ENCOUNTER — Ambulatory Visit (INDEPENDENT_AMBULATORY_CARE_PROVIDER_SITE_OTHER): Payer: Medicaid Other | Admitting: Physician Assistant

## 2018-04-29 ENCOUNTER — Encounter: Payer: Self-pay | Admitting: Physician Assistant

## 2018-04-29 VITALS — BP 148/91 | HR 80 | Ht 64.0 in | Wt 150.0 lb

## 2018-04-29 DIAGNOSIS — G43709 Chronic migraine without aura, not intractable, without status migrainosus: Secondary | ICD-10-CM

## 2018-04-29 DIAGNOSIS — IMO0002 Reserved for concepts with insufficient information to code with codable children: Secondary | ICD-10-CM

## 2018-04-29 DIAGNOSIS — G43009 Migraine without aura, not intractable, without status migrainosus: Secondary | ICD-10-CM

## 2018-04-29 MED ORDER — GALCANEZUMAB-GNLM 120 MG/ML ~~LOC~~ SOAJ: SUBCUTANEOUS | 11 refills | Status: DC

## 2018-04-29 MED ORDER — NITROFURANTOIN MONOHYD MACRO 100 MG PO CAPS: 100.0000 mg | ORAL_CAPSULE | Freq: Two times a day (BID) | ORAL | 0 refills | Status: DC

## 2018-04-29 MED ORDER — AMITRIPTYLINE HCL 50 MG PO TABS: ORAL_TABLET | ORAL | 5 refills | Status: DC

## 2018-04-29 NOTE — Patient Instructions (Signed)

## 2018-04-29 NOTE — Progress Notes (Signed)
History:  Sharon King is a 33 y.o. G1P0101 who presents to clinic today for headache evaluation.  Headaches have been worse.  They are the same quality- just more often, more severe and don't go away as easily.  Definitely there is more stress.She is still using amitriptyline at 50mg .  She uses neurontin 300mg  for shoulder pain and has not noticed any difference in headaches.  She is using tizanidine at night to help with shoulder pain.  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  This is a break from [employer].     HIT6:73 Number of days in the last 4 weeks with:  Severe headache: 6 Moderate headache: 12 Mild headache: 4  No headache: 6   Past Medical History:  Diagnosis Date  . Allergy   . Anxiety   . Asthma   . Cancer ([place])   . GERD (gastroesophageal reflux disease)   . History of preterm delivery   . Migraines   . Neuromuscular disorder ([place])   . Seizures ([place])    Last seizure 2014  . Sleep apnea   . Trigeminal neuralgia     Social History   Socioeconomic History  . Marital status: Legally Separated    Spouse name: Not on file  . Number of children: Not on file  . Years of education: Not on file  . Highest education level: Not on file  Occupational History  . Not on file  Social Needs  . Financial resource strain: Not on file  . Food insecurity:    Worry: Not on file    Inability: Not on file  . Transportation needs:    Medical: Not on file    Non-medical: Not on file  Tobacco Use  . Smoking status: Never Smoker  . Smokeless tobacco: Former Network engineer and Sexual Activity  . Alcohol use: No    Comment: rare-stopped with pregnancy  . Drug use: No  . Sexual activity: Not Currently    Birth control/protection: Pill  Lifestyle  . Physical activity:    Days per week: Not on file    Minutes per session: Not on file  . Stress: Not on file  Relationships  . Social connections:    Talks on phone: Not on file    Gets together: Not on file    Attends  religious service: Not on file    Active member of club or organization: Not on file    Attends meetings of clubs or organizations: Not on file    Relationship status: Not on file  . Intimate partner violence:    Fear of current or ex partner: Not on file    Emotionally abused: Not on file    Physically abused: Not on file    Forced sexual activity: Not on file  Other Topics Concern  . Not on file  Social History Narrative  . Not on file    Family History  Problem Relation Age of Onset  . Hyperlipidemia Mother   . Arthritis Mother   . Depression Mother   . Arthritis Father   . Depression Father   . Alcohol abuse Father     Allergies  Allergen Reactions  . Erythromycin Rash  . Paroxetine Palpitations  . Sulfonamide Derivatives Rash    Current Outpatient Medications on File Prior to Visit  Medication Sig Dispense Refill  . amitriptyline (ELAVIL) 50 MG tablet TAKE 1 TABLET BY MOUTH EVERYDAY AT BEDTIME 90 tablet 0  . gabapentin (NEURONTIN) 300 MG  capsule gabapentin 300 mg capsule  TAKE 1 CAPSULE BY MOUTH AT BEDTIME FOR 3 DAYS, THEN 1 CAPSULE TWICE A DAY THEN 1 CAP 3 TIMES A DAY    . norgestimate-ethinyl estradiol (ORTHO-CYCLEN,SPRINTEC,PREVIFEM) 0.25-35 MG-MCG tablet Take 1 tablet by mouth daily. Take active pills in a continuous fashion. Have a withdrawal bleed once every 3-6 months 3 Package 2  . tiZANidine (ZANAFLEX) 4 MG capsule Take two tablets at bed time. 60 capsule 0  . zolpidem (AMBIEN) 5 MG tablet Take 1 tablet (5 mg total) by mouth at bedtime as needed for sleep. 30 tablet 1   No current facility-administered medications on file prior to visit.      Review of Systems:  All pertinent positive/negative included in HPI, all other review of systems are negative   Objective:  Physical Exam BP (!) 148/91   Pulse 80   Ht 5\' 4"  (1.626 m)   Wt 150 lb (68 kg)   LMP 03/29/2018 (Approximate)   BMI 25.75 kg/m  CONSTITUTIONAL: Well-developed, well-nourished female in  no acute distress.  EYES: EOM intact ENT: Normocephalic CARDIOVASCULAR: Regular rate RESPIRATORY: Normal rate.  MUSCULOSKELETAL: Normal ROM SKIN: Warm, dry without erythema  NEUROLOGICAL: Alert, oriented, CN II-XII grossly intact, Appropriate balance PSYCH: Normal behavior, mood   Assessment & Plan:  Assessment: 1. Migraine without aura and without status migrainosus, not intractable   2. Chronic migraine    worsening  Plan: Begin Emgality for migraine prevention.  Pt encouraged to watch video on [email or web address] prior to taking.  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 Continue amitriptyline Continue other meds as previous Follow-up in 3 months or sooner PRN  Paticia Stack, PA-C 04/29/2018 11:26 AM

## 2018-06-09 ENCOUNTER — Telehealth: Payer: Self-pay | Admitting: *Deleted

## 2018-06-09 NOTE — Telephone Encounter (Signed)
-----   Message from Paticia Stack, PA-C sent at 06/08/2018  4:51 PM EST ----- Regarding: RE: medication refill That medication can lead to medication overuse headache.  It is best to avoid routine use.  Pt may RTC sooner if her headaches are not responding to the current treatment regimen.   ----- Message ----- From: Nelta Numbers, RN Sent: 06/07/2018   2:43 PM EST To: Darlina Rumpf, CNM, # Subject: medication refill                              Hi Ladies,  This patient called requesting a refill on her fiorinal with codeine that Sam wrote for her. Im messaging you both since Sam gave her original RX but Santiago Glad manages her migraines.   She uses [facility] in [place].   Thanks  Crosby Oyster, RN

## 2018-06-09 NOTE — Telephone Encounter (Signed)
Called patient to inform her of response from [name] regarding refill on Fiorinal.   Crosby Oyster, RN

## 2018-06-10 ENCOUNTER — Other Ambulatory Visit: Payer: Self-pay | Admitting: Physician Assistant

## 2018-06-10 DIAGNOSIS — M62838 Other muscle spasm: Secondary | ICD-10-CM

## 2018-06-10 NOTE — Telephone Encounter (Signed)
[facility] faxed refill request for the following medications:  tiZANidine (ZANAFLEX) 4 MG capsule   Please advise.

## 2018-06-14 MED ORDER — TIZANIDINE HCL 4 MG PO CAPS
ORAL_CAPSULE | ORAL | 0 refills | Status: DC
Start: 2018-06-14 — End: 2018-07-21

## 2018-07-20 ENCOUNTER — Other Ambulatory Visit: Payer: Self-pay | Admitting: Physician Assistant

## 2018-07-20 DIAGNOSIS — M62838 Other muscle spasm: Secondary | ICD-10-CM

## 2018-08-15 ENCOUNTER — Other Ambulatory Visit: Payer: Self-pay | Admitting: Physician Assistant

## 2018-08-15 ENCOUNTER — Telehealth: Payer: Self-pay | Admitting: Physician Assistant

## 2018-08-15 DIAGNOSIS — G8929 Other chronic pain: Secondary | ICD-10-CM

## 2018-08-15 DIAGNOSIS — M62838 Other muscle spasm: Secondary | ICD-10-CM

## 2018-08-15 DIAGNOSIS — M25512 Pain in left shoulder: Principal | ICD-10-CM

## 2018-08-15 NOTE — Telephone Encounter (Signed)
L.O.V. 03/24/2018,please advise.

## 2018-08-15 NOTE — Telephone Encounter (Signed)
[facility] faxed refill request for the following medications:  gabapentin (NEURONTIN) 300 MG capsule    Please advise.

## 2018-08-15 NOTE — Telephone Encounter (Signed)
L.O.V. 03/24/18, please advise.

## 2018-08-16 MED ORDER — GABAPENTIN 300 MG PO CAPS: 300.0000 mg | ORAL_CAPSULE | Freq: Three times a day (TID) | ORAL | 2 refills | Status: DC

## 2018-09-09 ENCOUNTER — Other Ambulatory Visit: Payer: Self-pay | Admitting: Physician Assistant

## 2018-09-09 DIAGNOSIS — M62838 Other muscle spasm: Secondary | ICD-10-CM

## 2018-09-09 NOTE — Telephone Encounter (Signed)
Please review

## 2018-09-12 ENCOUNTER — Other Ambulatory Visit: Payer: Self-pay | Admitting: *Deleted

## 2018-09-12 DIAGNOSIS — Z3041 Encounter for surveillance of contraceptive pills: Secondary | ICD-10-CM

## 2018-09-12 MED ORDER — NORGESTIMATE-ETH ESTRADIOL 0.25-35 MG-MCG PO TABS: 1.0000 | ORAL_TABLET | Freq: Every day | ORAL | 2 refills | Status: DC

## 2018-10-26 ENCOUNTER — Other Ambulatory Visit: Payer: Self-pay

## 2018-10-26 ENCOUNTER — Ambulatory Visit (INDEPENDENT_AMBULATORY_CARE_PROVIDER_SITE_OTHER): Payer: Medicaid Other | Admitting: Obstetrics & Gynecology

## 2018-10-26 ENCOUNTER — Encounter: Payer: Self-pay | Admitting: Radiology

## 2018-10-26 VITALS — BP 140/88 | HR 101

## 2018-10-26 DIAGNOSIS — Z7189 Other specified counseling: Secondary | ICD-10-CM

## 2018-10-26 DIAGNOSIS — G43009 Migraine without aura, not intractable, without status migrainosus: Secondary | ICD-10-CM | POA: Diagnosis not present

## 2018-10-26 MED ORDER — KETOROLAC TROMETHAMINE 60 MG/2ML IM SOLN
60.0000 mg | Freq: Once | INTRAMUSCULAR | Status: AC
  Administered 2018-10-26: 60 mg via INTRAMUSCULAR

## 2018-10-26 NOTE — Progress Notes (Signed)
Received call from patient requesting she come and get a Toradol injection for her migraine that she had since yesterday.Karen (HS) has prescribed this in the past for her. I e-mail the provider who agree to let her come and get one injection Toradol 60 mg as long as she did not make it a habit. Patient voice understanding and appointment has been scheduled for 10/26/2018 at 1:15.  Blood pressure is up due to headache.

## 2018-11-10 ENCOUNTER — Other Ambulatory Visit: Payer: Self-pay | Admitting: Physician Assistant

## 2018-11-10 DIAGNOSIS — M62838 Other muscle spasm: Secondary | ICD-10-CM

## 2018-11-16 ENCOUNTER — Other Ambulatory Visit: Payer: Self-pay | Admitting: *Deleted

## 2018-11-16 MED ORDER — AMITRIPTYLINE HCL 50 MG PO TABS: ORAL_TABLET | ORAL | 1 refills | Status: DC

## 2018-12-16 ENCOUNTER — Encounter: Payer: Self-pay | Admitting: Physician Assistant

## 2018-12-16 ENCOUNTER — Ambulatory Visit (INDEPENDENT_AMBULATORY_CARE_PROVIDER_SITE_OTHER): Payer: Medicaid Other | Admitting: Physician Assistant

## 2018-12-16 ENCOUNTER — Other Ambulatory Visit: Payer: Self-pay

## 2018-12-16 VITALS — BP 136/88 | HR 72 | Wt 164.8 lb

## 2018-12-16 DIAGNOSIS — G43709 Chronic migraine without aura, not intractable, without status migrainosus: Secondary | ICD-10-CM

## 2018-12-16 DIAGNOSIS — IMO0002 Reserved for concepts with insufficient information to code with codable children: Secondary | ICD-10-CM

## 2018-12-16 MED ORDER — SUMATRIPTAN SUCCINATE 100 MG PO TABS: 100.0000 mg | ORAL_TABLET | Freq: Once | ORAL | 11 refills | Status: DC | PRN

## 2018-12-16 MED ORDER — PROMETHAZINE HCL 12.5 MG PO TABS: 12.5000 mg | ORAL_TABLET | Freq: Four times a day (QID) | ORAL | 0 refills | Status: DC | PRN

## 2018-12-16 MED ORDER — AMITRIPTYLINE HCL 50 MG PO TABS: ORAL_TABLET | ORAL | 11 refills | Status: DC

## 2018-12-16 MED ORDER — REYVOW 100 MG PO TABS: 100.0000 mg | ORAL_TABLET | ORAL | 3 refills | Status: DC | PRN

## 2018-12-16 NOTE — Patient Instructions (Signed)

## 2018-12-16 NOTE — Progress Notes (Signed)
History:  Sharon King is a 33 y.o. G1P0101 who presents to clinic today for headache follow up.  She is on amitriptyline which may have been somewhat helpful but not great.  She did not trial emgality for prevention. She is having headaches more often than not AND they are moderate to severe half of the days of each month.  Quality of headaches is unchanged.   She is interested in trying Reyvow.   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  She requests a note to say she gets headaches related to mask wearing.   HIT6: 62 Number of days in the last 4 weeks with:  Severe headache: 5 Moderate headache: 8 Mild headache: 8  No headache: 7   Past Medical History:  Diagnosis Date  . Allergy   . Anxiety   . Asthma   . Cancer ([place])   . GERD (gastroesophageal reflux disease)   . History of preterm delivery   . Migraines   . Neuromuscular disorder ([place])   . Seizures ([place])    Last seizure 2014  . Sleep apnea   . Trigeminal neuralgia     Social History   Socioeconomic History  . Marital status: Legally Separated    Spouse name: Not on file  . Number of children: Not on file  . Years of education: Not on file  . Highest education level: Not on file  Occupational History  . Not on file  Social Needs  . Financial resource strain: Not on file  . Food insecurity    Worry: Not on file    Inability: Not on file  . Transportation needs    Medical: Not on file    Non-medical: Not on file  Tobacco Use  . Smoking status: Never Smoker  . Smokeless tobacco: Former Network engineer and Sexual Activity  . Alcohol use: No    Comment: rare-stopped with pregnancy  . Drug use: No  . Sexual activity: Not Currently    Birth control/protection: Pill  Lifestyle  . Physical activity    Days per week: Not on file    Minutes per session: Not on file  . Stress: Not on file  Relationships  . Social Herbalist on phone: Not on file    Gets  together: Not on file    Attends religious service: Not on file    Active member of club or organization: Not on file    Attends meetings of clubs or organizations: Not on file    Relationship status: Not on file  . Intimate partner violence    Fear of current or ex partner: Not on file    Emotionally abused: Not on file    Physically abused: Not on file    Forced sexual activity: Not on file  Other Topics Concern  . Not on file  Social History Narrative  . Not on file    Family History  Problem Relation Age of Onset  . Hyperlipidemia Mother   . Arthritis Mother   . Depression Mother   . Arthritis Father   . Depression Father   . Alcohol abuse Father     Allergies  Allergen Reactions  . Erythromycin Rash  . Paroxetine Palpitations  . Sulfonamide Derivatives Rash    Current Outpatient Medications on File Prior to Visit  Medication Sig Dispense Refill  . amitriptyline (ELAVIL) 50 MG tablet TAKE 1 TABLET BY MOUTH  EVERYDAY AT BEDTIME 30 tablet 1  . Galcanezumab-gnlm (EMGALITY) 120 MG/ML SOAJ Inject 240 mg into the skin as directed AND 120 mg every 30 (thirty) days. Inj 240mg  once then 120mg  monthly. 1 pen 11  . zolpidem (AMBIEN) 5 MG tablet Take 1 tablet (5 mg total) by mouth at bedtime as needed for sleep. 30 tablet 1  . gabapentin (NEURONTIN) 300 MG capsule Take 1 capsule (300 mg total) by mouth 3 (three) times daily. 90 capsule 2  . nitrofurantoin, macrocrystal-monohydrate, (MACROBID) 100 MG capsule Take 1 capsule (100 mg total) by mouth 2 (two) times daily. (Patient not taking: Reported on 12/16/2018) 10 capsule 0  . norgestimate-ethinyl estradiol (ORTHO-CYCLEN) 0.25-35 MG-MCG tablet Take 1 tablet by mouth daily. Take active pills in a continuous fashion. Have a withdrawal bleed once every 3-6 months (Patient not taking: Reported on 12/16/2018) 3 Package 2  . tiZANidine (ZANAFLEX) 4 MG tablet TAKE TWO TABLETS BY MOUTH AT BEDTIME (Patient not taking: Reported on 12/16/2018) 60  tablet 0   No current facility-administered medications on file prior to visit.      Review of Systems:  All pertinent positive/negative included in HPI, all other review of systems are negative   Objective:  Physical Exam BP 136/88   Pulse 72   Wt 164 lb 12.8 oz (74.8 kg)   BMI 28.29 kg/m  CONSTITUTIONAL: Well-developed, well-nourished female in no acute distress.  EYES: EOM intact ENT: Normocephalic CARDIOVASCULAR: Regular rate  RESPIRATORY: Normal rate.  MUSCULOSKELETAL: Normal ROM SKIN: Warm, dry without erythema  NEUROLOGICAL: Alert, oriented, CN II-XII grossly intact, Appropriate balance   PSYCH: Normal behavior, mood   Assessment & Plan:  Assessment: 1. Chronic migraine   unimproved   Plan: Take the emgality.  Pt obviously needs additional prevention.  Discussed extensively.  Pt has sample at home.  Reminded how to use.   New rx for Reyvow as requested.  Sedation precautions given.  NO DRIVING X 8 HOURS after use. Continue other meds as prevous  Follow-up in 6 months or sooner PRN  Paticia Stack, PA-C 12/16/2018 10:39 AM

## 2018-12-26 ENCOUNTER — Encounter: Payer: Self-pay | Admitting: *Deleted

## 2019-01-05 ENCOUNTER — Telehealth: Payer: Self-pay | Admitting: *Deleted

## 2019-01-05 NOTE — Telephone Encounter (Signed)
Pt informed that Reyvow was denied, pt must fail two preferred triptans. Pt states has papesr to bring in for [place] for fill out for her appeal and proof of taking meds. Karoline Caldwell also informed of denial and that pt will bring in paperwork to be filled out.

## 2019-01-12 ENCOUNTER — Telehealth (INDEPENDENT_AMBULATORY_CARE_PROVIDER_SITE_OTHER): Payer: Medicaid Other | Admitting: Family Medicine

## 2019-01-12 ENCOUNTER — Other Ambulatory Visit: Payer: Self-pay

## 2019-01-12 DIAGNOSIS — R05 Cough: Secondary | ICD-10-CM

## 2019-01-12 DIAGNOSIS — R509 Fever, unspecified: Secondary | ICD-10-CM

## 2019-01-12 DIAGNOSIS — R059 Cough, unspecified: Secondary | ICD-10-CM

## 2019-01-12 DIAGNOSIS — J029 Acute pharyngitis, unspecified: Secondary | ICD-10-CM | POA: Diagnosis not present

## 2019-01-12 DIAGNOSIS — R0981 Nasal congestion: Secondary | ICD-10-CM | POA: Diagnosis not present

## 2019-01-12 DIAGNOSIS — Z20822 Contact with and (suspected) exposure to covid-19: Secondary | ICD-10-CM

## 2019-01-12 NOTE — Progress Notes (Signed)
Patient: Sharon King Female    DOB: 02-Jul-1985   33 y.o.   MRN: [id number] Visit Date: 01/12/2019  Today's Provider: Lavon Paganini, MD   Chief Complaint  Patient presents with  . URI   Subjective:    Virtual Visit via Video Note  I connected with Ronaldo Miyamoto on 01/12/19 at  8:40 AM EDT by a video enabled telemedicine application and verified that I am speaking with the correct person using two identifiers.   Patient location: home Provider location: [place] involved in the visit: patient, provider   I discussed the limitations of evaluation and management by telemedicine and the availability of in person appointments. The patient expressed understanding and agreed to proceed.  URI  This is a new problem. Episode onset: Wednesday. The problem has been gradually worsening. Maximum temperature: 99.2. Associated symptoms include congestion, coughing and a sore throat. Associated symptoms comments: Fever . Treatments tried: Nasacort & OTC cold medication. The treatment provided mild relief.   Son with similar symptoms.  Her symptoms started yesterday  Allergies  Allergen Reactions  . Erythromycin Rash  . Paroxetine Palpitations  . Sulfonamide Derivatives Rash     Current Outpatient Medications:  .  amitriptyline (ELAVIL) 50 MG tablet, TAKE 1 TABLET BY MOUTH EVERYDAY AT BEDTIME, Disp: 30 tablet, Rfl: 11 .  gabapentin (NEURONTIN) 300 MG capsule, Take 1 capsule (300 mg total) by mouth 3 (three) times daily., Disp: 90 capsule, Rfl: 2 .  Galcanezumab-gnlm (EMGALITY) 120 MG/ML SOAJ, Inject 240 mg into the skin as directed AND 120 mg every 30 (thirty) days. Inj 240mg  once then 120mg  monthly., Disp: 1 pen, Rfl: 11 .  Lasmiditan Succinate (REYVOW) 100 MG TABS, Take 100 mg by mouth as needed (migraine)., Disp: 8 tablet, Rfl: 3 .  nitrofurantoin, macrocrystal-monohydrate, (MACROBID) 100 MG capsule, Take 1 capsule (100 mg total) by  mouth 2 (two) times daily. (Patient not taking: Reported on 12/16/2018), Disp: 10 capsule, Rfl: 0 .  norgestimate-ethinyl estradiol (ORTHO-CYCLEN) 0.25-35 MG-MCG tablet, Take 1 tablet by mouth daily. Take active pills in a continuous fashion. Have a withdrawal bleed once every 3-6 months (Patient not taking: Reported on 12/16/2018), Disp: 3 Package, Rfl: 2 .  promethazine (PHENERGAN) 12.5 MG tablet, Take 1 tablet (12.5 mg total) by mouth every 6 (six) hours as needed for nausea or vomiting., Disp: 30 tablet, Rfl: 0 .  SUMAtriptan (IMITREX) 100 MG tablet, Take 1 tablet (100 mg total) by mouth once as needed for up to 1 dose for migraine. May repeat in 2 hours if headache persists or recurs., Disp: 9 tablet, Rfl: 11 .  tiZANidine (ZANAFLEX) 4 MG tablet, TAKE TWO TABLETS BY MOUTH AT BEDTIME (Patient not taking: Reported on 12/16/2018), Disp: 60 tablet, Rfl: 0 .  zolpidem (AMBIEN) 5 MG tablet, Take 1 tablet (5 mg total) by mouth at bedtime as needed for sleep., Disp: 30 tablet, Rfl: 1  Review of Systems  Constitutional: Negative.   HENT: Positive for congestion and sore throat.   Respiratory: Positive for cough.   Cardiovascular: Negative.   Musculoskeletal: Negative.     Social History   Tobacco Use  . Smoking status: Never Smoker  . Smokeless tobacco: Former Network engineer Use Topics  . Alcohol use: No    Comment: rare-stopped with pregnancy      Objective:   There were no vitals taken for this visit. There were no vitals filed for this visit.There is no height  or weight on file to calculate BMI.   Physical Exam Constitutional:      General: She is not in acute distress.    Appearance: She is ill-appearing. She is not toxic-appearing or diaphoretic.  HENT:     Head: Normocephalic and atraumatic.  Eyes:     General: No scleral icterus.    Conjunctiva/sclera: Conjunctivae normal.  Pulmonary:     Effort: Pulmonary effort is normal. No respiratory distress.  Neurological:     Mental  Status: She is alert and oriented to person, place, and time.  Psychiatric:        Mood and Affect: Mood normal.        Behavior: Behavior normal.      No results found for any visits on 01/12/19.     Assessment & Plan      I discussed the assessment and treatment plan with the patient. The patient was provided an opportunity to ask questions and all were answered. The patient agreed with the plan and demonstrated an understanding of the instructions.   The patient was advised to call back or seek an in-person evaluation if the symptoms worsen or if the condition fails to improve as anticipated.  1. Cough 2. Sinus congestion 3. Fever, unspecified fever cause 4. Sore throat - symptoms and exam c/w viral URI  - doubt strep pharyngitis, CAP, bacterial sinusitis, or other bacterial infection - concerning for possible COVID19 infection - discussed need to self-isolate, along with all household members for 10 days from start of illness and until fever free for at least 48 hours. - will send for outpatient COVID19 testing - discussed symptomatic management, natural course, and return precautions  - Novel Coronavirus, NAA ([facility])    Return if symptoms worsen or fail to improve.   The entirety of the information documented in the History of Present Illness, Review of Systems and Physical Exam were personally obtained by me. Portions of this information were initially documented by Tiburcio Pea, CMA and reviewed by me for thoroughness and accuracy.    Maliq Pilley, Dionne Bucy, MD MPH [facility]

## 2019-01-12 NOTE — Patient Instructions (Signed)
This information is directly available on the CDC website: https://www.cdc.gov/coronavirus/2019-ncov/if-you-are-sick/steps-when-sick.html    Source:CDC Reference to specific commercial products, manufacturers, companies, or trademarks does not constitute its endorsement or recommendation by the U.S. Government, Department of Health and Human Services, or Centers for Disease Control and Prevention.  

## 2019-01-13 LAB — NOVEL CORONAVIRUS, NAA: SARS-CoV-2, NAA: NOT DETECTED

## 2019-01-27 ENCOUNTER — Other Ambulatory Visit: Payer: Self-pay | Admitting: Physician Assistant

## 2019-01-27 DIAGNOSIS — M62838 Other muscle spasm: Secondary | ICD-10-CM

## 2019-03-13 ENCOUNTER — Other Ambulatory Visit: Payer: Self-pay | Admitting: *Deleted

## 2019-03-13 DIAGNOSIS — Z3041 Encounter for surveillance of contraceptive pills: Secondary | ICD-10-CM

## 2019-03-13 MED ORDER — NORGESTIMATE-ETH ESTRADIOL 0.25-35 MG-MCG PO TABS: 1.0000 | ORAL_TABLET | Freq: Every day | ORAL | 1 refills | Status: DC

## 2019-03-14 ENCOUNTER — Encounter (HOSPITAL_COMMUNITY): Payer: Self-pay | Admitting: Emergency Medicine

## 2019-03-14 ENCOUNTER — Other Ambulatory Visit: Payer: Self-pay

## 2019-03-14 ENCOUNTER — Emergency Department (HOSPITAL_COMMUNITY)
Admission: EM | Admit: 2019-03-14 | Discharge: 2019-03-14 | Disposition: A | Discharge status: Home / Self Care | Payer: Medicaid Other | Attending: Emergency Medicine | Admitting: Emergency Medicine

## 2019-03-14 DIAGNOSIS — R519 Headache, unspecified: Secondary | ICD-10-CM

## 2019-03-14 DIAGNOSIS — G43909 Migraine, unspecified, not intractable, without status migrainosus: Secondary | ICD-10-CM | POA: Diagnosis not present

## 2019-03-14 DIAGNOSIS — Z79899 Other long term (current) drug therapy: Secondary | ICD-10-CM | POA: Diagnosis not present

## 2019-03-14 DIAGNOSIS — R112 Nausea with vomiting, unspecified: Secondary | ICD-10-CM | POA: Diagnosis not present

## 2019-03-14 DIAGNOSIS — Z5320 Procedure and treatment not carried out because of patient's decision for unspecified reasons: Secondary | ICD-10-CM | POA: Diagnosis not present

## 2019-03-14 DIAGNOSIS — H53149 Visual discomfort, unspecified: Secondary | ICD-10-CM | POA: Insufficient documentation

## 2019-03-14 LAB — I-STAT BETA HCG BLOOD, ED (MC, WL, AP ONLY): I-stat hCG, quantitative: <5 m[IU]/mL (ref <5)

## 2019-03-14 MED ORDER — PROCHLORPERAZINE EDISYLATE 10 MG/2ML IJ SOLN
10.0000 mg | Freq: Once | INTRAMUSCULAR | Status: AC
  Administered 2019-03-14: 15:00:00 10 mg via INTRAVENOUS
  Filled 2019-03-14: qty 2

## 2019-03-14 MED ORDER — SODIUM CHLORIDE 0.9 % IV BOLUS
1000.0000 mL | Freq: Once | INTRAVENOUS | Status: AC
  Administered 2019-03-14: 1000 mL via INTRAVENOUS

## 2019-03-14 MED ORDER — KETOROLAC TROMETHAMINE 30 MG/ML IJ SOLN: 30.0000 mg | Freq: Once | INTRAMUSCULAR | Status: DC

## 2019-03-14 MED ORDER — DIPHENHYDRAMINE HCL 50 MG/ML IJ SOLN
25.0000 mg | Freq: Once | INTRAMUSCULAR | Status: AC
  Administered 2019-03-14: 25 mg via INTRAVENOUS
  Filled 2019-03-14: qty 1

## 2019-03-14 NOTE — ED Triage Notes (Signed)
Patient c/o migraine onset of 3:30am today along with emesis and vision problems to right eye. Reports hx of migraines and took home medication w/o relief.

## 2019-03-14 NOTE — ED Provider Notes (Signed)
[name] Provider Note   CSN: [id number] Arrival date & time: 03/14/19  1301     History   Chief Complaint Chief Complaint  Patient presents with  . Migraine    HPI Sharon King is a 33 y.o. female with history of migraine headaches, trigeminal neuralgia, seizures, asthma, anxiety, allergies presents for evaluation of persistent right-sided headache.  Reports that she awoke this morning at 3:30 AM with right-sided headache.  Headache is throbbing, radiates down the right side of the face.  She notes associated blurred vision of the right eye, phonophobia and photophobia.  She states that it is not unusual for her migraine headaches to be associated with vision changes and she will sometimes have blurred vision or even complete loss of vision to the right eye with her migraine headaches.  She reports chronic sensory changes to the right side of her body ever since her epidural in 2014, notes no worsening in paresthesias/numbness.  She has had nausea and one episode of nonbloody nonbilious emesis.  She has had 2 doses of 100 mg sumatriptan and p.o. Toradol and Phenergan without relief of symptoms.  Reports that she has experienced migraine headaches this severe previously but lately they have been less severe than this.  No fevers or neck stiffness.  Denies chest pain or shortness of breath, no abdominal pain.     The history is provided by the patient.    Past Medical History:  Diagnosis Date  . Allergy   . Anxiety   . Asthma   . Cancer ([place])   . GERD (gastroesophageal reflux disease)   . History of preterm delivery   . Migraines   . Neuromuscular disorder ([place])   . Seizures ([place])    Last seizure 2014  . Sleep apnea   . Trigeminal neuralgia     Patient Active Problem List   Diagnosis Date Noted  . Left shoulder pain 03/04/2018  . Muscle spasm 02/18/2016  . ALLERGIC RHINITIS 04/22/2007  . ASTHMA 11/06/2006  . Migraines  11/06/2006    Past Surgical History:  Procedure Laterality Date  . BREAST ENHANCEMENT SURGERY  2008  . BREAST SURGERY    . CESAREAN SECTION N/A 06/13/2015   Procedure: CESAREAN SECTION;  Surgeon: Lavonia Drafts, MD;  Location: [facility];  Service: Obstetrics;  Laterality: N/A;  . COSMETIC SURGERY    . wisdom teeth       OB History    Gravida  1   Para  1   Term      Preterm  1   AB      Living  1     SAB      TAB      Ectopic      Multiple  0   Live Births  1            Home Medications    Prior to Admission medications   Medication Sig Start Date End Date Taking? Authorizing Provider  amitriptyline (ELAVIL) 50 MG tablet TAKE 1 TABLET BY MOUTH EVERYDAY AT BEDTIME 12/16/18   Jaclyn Prime, Collene Leyden, PA-C  gabapentin (NEURONTIN) 300 MG capsule Take 1 capsule (300 mg total) by mouth 3 (three) times daily. 08/16/18 11/14/18  Trinna Post, PA-C  Galcanezumab-gnlm (EMGALITY) 120 MG/ML SOAJ Inject 240 mg into the skin as directed AND 120 mg every 30 (thirty) days. Inj 240mg  once then 120mg  monthly. 04/29/18   Paticia Stack, PA-C  Lasmiditan  Succinate (REYVOW) 100 MG TABS Take 100 mg by mouth as needed (migraine). 12/16/18   Jaclyn Prime, Collene Leyden, PA-C  nitrofurantoin, macrocrystal-monohydrate, (MACROBID) 100 MG capsule Take 1 capsule (100 mg total) by mouth 2 (two) times daily. Patient not taking: Reported on 12/16/2018 04/29/18   Jaclyn Prime, Collene Leyden, PA-C  norgestimate-ethinyl estradiol (ORTHO-CYCLEN) 0.25-35 MG-MCG tablet Take 1 tablet by mouth daily. Take active pills in a continuous fashion. Have a withdrawal bleed once every 3-6 months 03/13/19   Darlina Rumpf, CNM  promethazine (PHENERGAN) 12.5 MG tablet Take 1 tablet (12.5 mg total) by mouth every 6 (six) hours as needed for nausea or vomiting. 12/16/18   Jaclyn Prime, Collene Leyden, PA-C  SUMAtriptan (IMITREX) 100 MG tablet Take 1 tablet (100 mg total) by mouth once as needed for up to 1 dose for migraine.  May repeat in 2 hours if headache persists or recurs. 12/16/18   Jaclyn Prime, Collene Leyden, PA-C  tiZANidine (ZANAFLEX) 4 MG tablet TAKE TWO TABLETS BY MOUTH AT BEDTIME 01/27/19   Mar Daring, PA-C  zolpidem (AMBIEN) 5 MG tablet Take 1 tablet (5 mg total) by mouth at bedtime as needed for sleep. 03/23/18   Darlina Rumpf, CNM    Family History Family History  Problem Relation Age of Onset  . Hyperlipidemia Mother   . Arthritis Mother   . Depression Mother   . Arthritis Father   . Depression Father   . Alcohol abuse Father     Social History Social History   Tobacco Use  . Smoking status: Never Smoker  . Smokeless tobacco: Former Network engineer Use Topics  . Alcohol use: No    Comment: rare-stopped with pregnancy  . Drug use: No     Allergies   Erythromycin, Paroxetine, and Sulfonamide derivatives   Review of Systems Review of Systems  Constitutional: Negative for chills and fever.  Eyes: Positive for photophobia and visual disturbance. Negative for pain.  Respiratory: Negative for shortness of breath.   Cardiovascular: Negative for chest pain.  Gastrointestinal: Positive for nausea and vomiting. Negative for abdominal pain.  Musculoskeletal: Negative for neck stiffness.  Neurological: Positive for headaches. Negative for dizziness, weakness and numbness (chronic, unchanged).  All other systems reviewed and are negative.    Physical Exam Updated Vital Signs BP (!) 135/94 (BP Location: Right Arm)   Pulse (!) 108   Temp 98.6 F (37 C) (Oral)   Resp 16   SpO2 97%   Physical Exam Vitals signs and nursing note reviewed.  Constitutional:      General: She is not in acute distress.    Appearance: She is well-developed.  HENT:     Head: Normocephalic and atraumatic.  Eyes:     General:        Right eye: No discharge.        Left eye: No discharge.     Extraocular Movements: Extraocular movements intact.     Conjunctiva/sclera: Conjunctivae normal.      Pupils: Pupils are equal, round, and reactive to light.  Neck:     Musculoskeletal: Normal range of motion and neck supple. No neck rigidity.     Vascular: No JVD.     Trachea: No tracheal deviation.  Cardiovascular:     Rate and Rhythm: Regular rhythm. Tachycardia present.     Heart sounds: Normal heart sounds.  Pulmonary:     Effort: Pulmonary effort is normal.     Breath sounds: Normal breath sounds.  Abdominal:     General: Abdomen is flat. Bowel sounds are normal. There is no distension.     Palpations: Abdomen is soft.     Tenderness: There is no abdominal tenderness. There is no guarding or rebound.  Musculoskeletal: Normal range of motion.        General: No tenderness.  Skin:    General: Skin is warm and dry.     Capillary Refill: Capillary refill takes less than 2 seconds.     Findings: No erythema.  Neurological:     Mental Status: She is alert and oriented to person, place, and time.     Comments: Mental Status:  Alert, thought content appropriate, able to give a coherent history. Speech fluent without evidence of aphasia. Able to follow 2 step commands without difficulty.  Cranial Nerves:  II:  Peripheral visual fields grossly normal, pupils equal, round, reactive to light III,IV, VI: ptosis not present, extra-ocular motions intact bilaterally  V,VII: smile symmetric VIII: hearing grossly normal to voice  X: uvula elevates symmetrically  XI: bilateral shoulder shrug symmetric and strong XII: midline tongue extension without fassiculations Motor:  Normal tone. 5/5 strength of BUE and BLE major muscle groups including strong and equal grip strength and dorsiflexion/plantar flexion Sensory: Altered sensation to light touch of the right side of the face and upper and lower extremities.  Patient reports this is chronic and unchanged since epidural in 2014. Cerebellar: normal finger-to-nose with bilateral upper extremities, Romberg sign absent Gait: normal gait and  balance. Able to walk on toes and heels with ease.     Psychiatric:        Behavior: Behavior normal.      ED Treatments / Results  Labs (all labs ordered are listed, but only abnormal results are displayed) Labs Reviewed  I-STAT BETA HCG BLOOD, ED ([facility], [facility], [facility] ONLY)    EKG None  Radiology No results found.  Procedures Procedures (including critical care time)  Medications Ordered in ED Medications  sodium chloride 0.9 % bolus 1,000 mL (0 mLs Intravenous Stopped 03/14/19 1459)  prochlorperazine (COMPAZINE) injection 10 mg (10 mg Intravenous Given 03/14/19 1446)  diphenhydrAMINE (BENADRYL) injection 25 mg (25 mg Intravenous Given 03/14/19 1446)     Initial Impression / Assessment and Plan / ED Course  I have reviewed the triage vital signs and the nursing notes.  Pertinent labs & imaging results that were available during my care of the patient were reviewed by me and considered in my medical decision making (see chart for details).        Patient presenting for evaluation of right-sided headache with associated nausea and vomiting and right eye blurred vision.  She is afebrile, mildly tachycardic on initial assessment but does not appear to be in any distress.  Normal neurologic examination though did not have a chance to check visual acuity before the patient eloped AMA.  No new focal neurologic deficits noted, though she does have chronic sensory changes to the right side of the body after an epidural for years ago.  No red flag signs concerning for subarachnoid hemorrhage, intracranial hemorrhage.  Doubt CVA or TIA.  She states that her headache is very consistent with her usual migraine headaches and she takes multiple migraine medications.  It looks like these are being prescribed by her OB/GYN so I did encourage her to find a neurologist to manage her headaches.  Will give migraine cocktail and reassess.  I do not feel that there is much utility  in emergent imaging at this  time given reassuring neurologic examination and patient's history consistent with her usual migraine headaches however we will reassess and if blurred vision persists despite improvement in headache we will pursue imaging.  3:00PM Patient expressed to RN that she needed to leave to pick up her son and eloped from the ED.  I was unable to reassess the patient after she received her migraine cocktail.  Final Clinical Impressions(s) / ED Diagnoses   Final diagnoses:  Bad headache    ED Discharge Orders    None       Debroah Baller 03/15/19 0645    Davonna Belling, MD 03/24/19 [phone]

## 2019-03-14 NOTE — ED Notes (Addendum)
Pt expressed needing to leave urgently to get her son. Pt understands risk of leaving AMA.

## 2019-04-10 ENCOUNTER — Other Ambulatory Visit: Payer: Self-pay | Admitting: Physician Assistant

## 2019-04-10 DIAGNOSIS — M62838 Other muscle spasm: Secondary | ICD-10-CM

## 2019-04-10 NOTE — Telephone Encounter (Signed)
Requested medication (s) are due for refill today: yes  Requested medication (s) are on the active medication list: yes  Last refill:  01/27/19  Future visit scheduled: No  Notes to clinic:  Unable to refill per protocol. Refill can not be delegated    Requested Prescriptions  Pending Prescriptions Disp Refills   tiZANidine (ZANAFLEX) 4 MG tablet [Pharmacy Med Name: TIZANIDINE HCL 4 MG TAB] 60 tablet 0    Sig: TAKE TWO TABLETS BY MOUTH AT BEDTIME      Not Delegated - Cardiovascular:  Alpha-2 Agonists - tizanidine Failed - 04/10/2019  4:59 PM      Failed - This refill cannot be delegated      Passed - Valid encounter within last 6 months    Recent Outpatient Visits           2 months ago Cough   [facility], Dionne Bucy, MD   1 year ago Muscle spasm   [facility], [place], [place]   1 year ago Insomnia, unspecified type   [facility], [place], [place]   8 years ago Nausea & vomiting   [facility], Evie Lacks, MD

## 2019-04-11 NOTE — Telephone Encounter (Signed)
From [facility] 

## 2019-04-11 NOTE — Telephone Encounter (Signed)
Please schedule OV before more refills. Can be virtual. Hasn't been seen in > 1 year.

## 2019-04-13 NOTE — Telephone Encounter (Signed)
Contacted patient and she states that she will give the office a call back  To schedule appointment because she was at work. FYI

## 2019-04-24 NOTE — Progress Notes (Deleted)
Patient: Sharon King, Female    DOB: [birth year], 34 y.o.   MRN: [id number] Visit Date: 04/24/2019  Today's Provider: Trinna Post, PA-C   No chief complaint on file.  Subjective:     Annual physical exam Sharon King is a 34 y.o. female who presents today for health maintenance and complete physical. She feels {DESC; WELL/FAIRLY WELL/POORLY:18703}. She reports exercising ***. She reports she is sleeping {DESC; WELL/FAIRLY WELL/POORLY:18703}.  -----------------------------------------------------------------  Last pap: 09/18/2016  Review of Systems  Social History      She  reports that she has never smoked. She has quit using smokeless tobacco. She reports that she does not drink alcohol or use drugs.       Social History   Socioeconomic History  . Marital status: Legally Separated    Spouse name: Not on file  . Number of children: Not on file  . Years of education: Not on file  . Highest education level: Not on file  Occupational History  . Not on file  Tobacco Use  . Smoking status: Never Smoker  . Smokeless tobacco: Former Network engineer and Sexual Activity  . Alcohol use: No    Comment: rare-stopped with pregnancy  . Drug use: No  . Sexual activity: Not Currently    Birth control/protection: Pill  Other Topics Concern  . Not on file  Social History Narrative  . Not on file   Social Determinants of Health   Financial Resource Strain:   . Difficulty of Paying Living Expenses: Not on file  Food Insecurity:   . Worried About Charity fundraiser in the Last Year: Not on file  . Ran Out of Food in the Last Year: Not on file  Transportation Needs:   . Lack of Transportation (Medical): Not on file  . Lack of Transportation (Non-Medical): Not on file  Physical Activity:   . Days of Exercise per Week: Not on file  . Minutes of Exercise per Session: Not on file  Stress:   . Feeling of Stress : Not on file  Social Connections:   .  Frequency of Communication with Friends and Family: Not on file  . Frequency of Social Gatherings with Friends and Family: Not on file  . Attends Religious Services: Not on file  . Active Member of Clubs or Organizations: Not on file  . Attends Archivist Meetings: Not on file  . Marital Status: Not on file    Past Medical History:  Diagnosis Date  . Allergy   . Anxiety   . Asthma   . Cancer ([place])   . GERD (gastroesophageal reflux disease)   . History of preterm delivery   . Migraines   . Neuromuscular disorder ([place])   . Seizures ([place])    Last seizure 2014  . Sleep apnea   . Trigeminal neuralgia      Patient Active Problem List   Diagnosis Date Noted  . Left shoulder pain 03/04/2018  . Muscle spasm 02/18/2016  . ALLERGIC RHINITIS 04/22/2007  . ASTHMA 11/06/2006  . Migraines 11/06/2006    Past Surgical History:  Procedure Laterality Date  . BREAST ENHANCEMENT SURGERY  2008  . BREAST SURGERY    . CESAREAN SECTION N/A 06/13/2015   Procedure: CESAREAN SECTION;  Surgeon: Lavonia Drafts, MD;  Location: [facility];  Service: Obstetrics;  Laterality: N/A;  . COSMETIC SURGERY    . wisdom teeth      Family History  Family Status  Relation Name Status  . Mother  Alive  . Father  Alive        Her family history includes Alcohol abuse in her father; Arthritis in her father and mother; Depression in her father and mother; Hyperlipidemia in her mother.      Allergies  Allergen Reactions  . Erythromycin Rash  . Paroxetine Palpitations  . Sulfonamide Derivatives Rash     Current Outpatient Medications:  .  amitriptyline (ELAVIL) 50 MG tablet, TAKE 1 TABLET BY MOUTH EVERYDAY AT BEDTIME, Disp: 30 tablet, Rfl: 11 .  gabapentin (NEURONTIN) 300 MG capsule, Take 1 capsule (300 mg total) by mouth 3 (three) times daily., Disp: 90 capsule, Rfl: 2 .  Galcanezumab-gnlm (EMGALITY) 120 MG/ML SOAJ, Inject 240 mg into the skin as directed AND 120 mg every 30  (thirty) days. Inj 240mg  once then 120mg  monthly., Disp: 1 pen, Rfl: 11 .  Lasmiditan Succinate (REYVOW) 100 MG TABS, Take 100 mg by mouth as needed (migraine)., Disp: 8 tablet, Rfl: 3 .  nitrofurantoin, macrocrystal-monohydrate, (MACROBID) 100 MG capsule, Take 1 capsule (100 mg total) by mouth 2 (two) times daily. (Patient not taking: Reported on 12/16/2018), Disp: 10 capsule, Rfl: 0 .  norgestimate-ethinyl estradiol (ORTHO-CYCLEN) 0.25-35 MG-MCG tablet, Take 1 tablet by mouth daily. Take active pills in a continuous fashion. Have a withdrawal bleed once every 3-6 months, Disp: 3 Package, Rfl: 1 .  promethazine (PHENERGAN) 12.5 MG tablet, Take 1 tablet (12.5 mg total) by mouth every 6 (six) hours as needed for nausea or vomiting., Disp: 30 tablet, Rfl: 0 .  SUMAtriptan (IMITREX) 100 MG tablet, Take 1 tablet (100 mg total) by mouth once as needed for up to 1 dose for migraine. May repeat in 2 hours if headache persists or recurs., Disp: 9 tablet, Rfl: 11 .  tiZANidine (ZANAFLEX) 4 MG tablet, TAKE TWO TABLETS BY MOUTH AT BEDTIME, Disp: 60 tablet, Rfl: 0 .  zolpidem (AMBIEN) 5 MG tablet, Take 1 tablet (5 mg total) by mouth at bedtime as needed for sleep., Disp: 30 tablet, Rfl: 1   Patient Care Team: Paulene Floor as PCP - General (Physician Assistant) Susa Day, MD as Consulting Physician (Orthopedic Surgery)    Objective:    Vitals: There were no vitals taken for this visit.  There were no vitals filed for this visit.   Physical Exam   Depression Screen PHQ 2/9 Scores 02/18/2018 06/07/2015 05/10/2015 04/12/2015  PHQ - 2 Score 0 0 0 0  PHQ- 9 Score 1 - - -       Assessment & Plan:     Routine Health Maintenance and Physical Exam  Exercise Activities and Dietary recommendations Goals   None     Immunization History  Administered Date(s) Administered  . Influenza Whole 04/14/2005  . Influenza,inj,Quad PF,6+ Mos 12/13/2014, 02/13/2019  . Tdap 05/15/2015    Health  Maintenance  Topic Date Due  . PAP SMEAR-Modifier  09/19/2019  . TETANUS/TDAP  05/14/2025  . INFLUENZA VACCINE  Completed  . HIV Screening  Completed     Discussed health benefits of physical activity, and encouraged her to engage in regular exercise appropriate for her age and condition.    --------------------------------------------------------------------    Trinna Post, PA-C  [place]

## 2019-04-25 ENCOUNTER — Ambulatory Visit: Payer: Medicaid Other | Admitting: Physician Assistant

## 2019-06-12 ENCOUNTER — Ambulatory Visit (INDEPENDENT_AMBULATORY_CARE_PROVIDER_SITE_OTHER): Payer: Medicaid Other

## 2019-06-12 ENCOUNTER — Ambulatory Visit (HOSPITAL_COMMUNITY)
Admission: EM | Admit: 2019-06-12 | Discharge: 2019-06-12 | Disposition: A | Discharge status: Home / Self Care | Payer: Medicaid Other

## 2019-06-12 ENCOUNTER — Other Ambulatory Visit: Payer: Self-pay

## 2019-06-12 ENCOUNTER — Encounter (HOSPITAL_COMMUNITY): Payer: Self-pay

## 2019-06-12 DIAGNOSIS — R079 Chest pain, unspecified: Secondary | ICD-10-CM

## 2019-06-12 DIAGNOSIS — R1013 Epigastric pain: Secondary | ICD-10-CM

## 2019-06-12 DIAGNOSIS — G43909 Migraine, unspecified, not intractable, without status migrainosus: Secondary | ICD-10-CM

## 2019-06-12 MED ORDER — LIDOCAINE VISCOUS HCL 2 % MT SOLN
15.0000 mL | Freq: Once | OROMUCOSAL | Status: AC
  Administered 2019-06-12: 15 mL via ORAL

## 2019-06-12 MED ORDER — KETOROLAC TROMETHAMINE 60 MG/2ML IM SOLN
INTRAMUSCULAR | Status: AC
  Filled 2019-06-12: qty 2

## 2019-06-12 MED ORDER — ALUM & MAG HYDROXIDE-SIMETH 200-200-20 MG/5ML PO SUSP
30.0000 mL | Freq: Once | ORAL | Status: AC
  Administered 2019-06-12: 30 mL via ORAL

## 2019-06-12 MED ORDER — LIDOCAINE VISCOUS HCL 2 % MT SOLN
OROMUCOSAL | Status: AC
  Filled 2019-06-12: qty 15

## 2019-06-12 MED ORDER — PANTOPRAZOLE SODIUM 20 MG PO TBEC: 20.0000 mg | DELAYED_RELEASE_TABLET | Freq: Every day | ORAL | 0 refills | Status: DC

## 2019-06-12 MED ORDER — ALUM & MAG HYDROXIDE-SIMETH 200-200-20 MG/5ML PO SUSP
ORAL | Status: AC
  Filled 2019-06-12: qty 30

## 2019-06-12 MED ORDER — KETOROLAC TROMETHAMINE 60 MG/2ML IM SOLN
60.0000 mg | Freq: Once | INTRAMUSCULAR | Status: AC
  Administered 2019-06-12: 60 mg via INTRAMUSCULAR

## 2019-06-12 NOTE — ED Notes (Signed)
Pt states no change in pain level since taking GI cocktail.

## 2019-06-12 NOTE — Discharge Instructions (Signed)
Your chest xray, ekg and exam are overall reassuring here today.  I would like to try start a PPI to see if this is helpful with your symptoms.  See diet recommendations as well.  Limit NSAIDS as able to prevent symptoms as well.  If worsening of pain, develop nausea, palpitations, shortness of breath, dizziness, or otherwise worsening please go to the ER.  If persistent or recurrent please follow up with your PCP.

## 2019-06-12 NOTE — ED Provider Notes (Signed)
[place]    CSN: [id number] Arrival date & time: 06/12/19  0807      History   Chief Complaint Chief Complaint  Patient presents with  . Chest Pain  . Headache    HPI Sharon King is a 34 y.o. female.   Sharon King presents with complaints of mid sternal chest pain which woke her up this morning around 0200. Took tums, it did seem to help for about an hour and then pain returned. Additional tums hasn't helped. No nausea or vomiting. No other URI symptoms. No cough. Felt some shortness of breath earlier today which has resolved. No pain with breathing. No calf pain or tenderness. Pain radiates to right posterior shoulder. Hasn't eaten anything this morning. Ate steak for dinner last night. Drank mild this morning. Doesn't smoke. No known ill contacts. History of chronic/ recurrent migraines and this has seemed to trigger a migraines. She takes amitriptyline as well as tizanidine for prevention of migraines, imitrex prn which she took two days ago. Uses NSAIDs for migraines as well, including motrin and oral toradol, which she took last three days ago. No personal cardiac history. Parents with history of mi and htn.     ROS per HPI, negative if not otherwise mentioned.      Past Medical History:  Diagnosis Date  . Allergy   . Anxiety   . Asthma   . Cancer ([place])   . GERD (gastroesophageal reflux disease)   . History of preterm delivery   . Migraines   . Neuromuscular disorder ([place])   . Seizures ([place])    Last seizure 2014  . Sleep apnea   . Trigeminal neuralgia     Patient Active Problem List   Diagnosis Date Noted  . Left shoulder pain 03/04/2018  . Muscle spasm 02/18/2016  . ALLERGIC RHINITIS 04/22/2007  . ASTHMA 11/06/2006  . Migraines 11/06/2006    Past Surgical History:  Procedure Laterality Date  . BREAST ENHANCEMENT SURGERY  2008  . BREAST SURGERY    . CESAREAN SECTION N/A 06/13/2015   Procedure: CESAREAN SECTION;  Surgeon:  Lavonia Drafts, MD;  Location: [facility];  Service: Obstetrics;  Laterality: N/A;  . COSMETIC SURGERY    . wisdom teeth      OB History    Gravida  1   Para  1   Term      Preterm  1   AB      Living  1     SAB      TAB      Ectopic      Multiple  0   Live Births  1            Home Medications    Prior to Admission medications   Medication Sig Start Date End Date Taking? Authorizing Provider  SUMAtriptan Succinate Refill 6 MG/0.5ML SOCT  07/14/13  Yes [provider]  amitriptyline (ELAVIL) 50 MG tablet TAKE 1 TABLET BY MOUTH EVERYDAY AT BEDTIME 12/16/18   Jaclyn Prime, Collene Leyden, PA-C  gabapentin (NEURONTIN) 300 MG capsule Take 1 capsule (300 mg total) by mouth 3 (three) times daily. 08/16/18 11/14/18  Trinna Post, PA-C  Galcanezumab-gnlm (EMGALITY) 120 MG/ML SOAJ Inject 240 mg into the skin as directed AND 120 mg every 30 (thirty) days. Inj 240mg  once then 120mg  monthly. 04/29/18   Jaclyn Prime, Collene Leyden, PA-C  Lasmiditan Succinate (REYVOW) 100 MG TABS Take 100 mg by mouth as needed (migraine).  12/16/18   Jaclyn Prime, Collene Leyden, PA-C  nitrofurantoin, macrocrystal-monohydrate, (MACROBID) 100 MG capsule Take 1 capsule (100 mg total) by mouth 2 (two) times daily. Patient not taking: Reported on 12/16/2018 04/29/18   Jaclyn Prime, Collene Leyden, PA-C  norgestimate-ethinyl estradiol (ORTHO-CYCLEN) 0.25-35 MG-MCG tablet Take 1 tablet by mouth daily. Take active pills in a continuous fashion. Have a withdrawal bleed once every 3-6 months 03/13/19   [name] C, CNM  pantoprazole (PROTONIX) 20 MG tablet Take 1 tablet (20 mg total) by mouth daily. 06/12/19   Zigmund Gottron, NP  promethazine (PHENERGAN) 12.5 MG tablet Take 1 tablet (12.5 mg total) by mouth every 6 (six) hours as needed for nausea or vomiting. 12/16/18   Jaclyn Prime, Collene Leyden, PA-C  SUMAtriptan (IMITREX) 100 MG tablet Take 1 tablet (100 mg total) by mouth once as needed for up to 1 dose for migraine. May  repeat in 2 hours if headache persists or recurs. 12/16/18   Jaclyn Prime, Collene Leyden, PA-C  tiZANidine (ZANAFLEX) 4 MG tablet TAKE TWO TABLETS BY MOUTH AT BEDTIME 04/11/19   Carles Collet M, PA-C  zolpidem (AMBIEN) 5 MG tablet Take 1 tablet (5 mg total) by mouth at bedtime as needed for sleep. 03/23/18   Darlina Rumpf, CNM    Family History Family History  Problem Relation Age of Onset  . Hyperlipidemia Mother   . Arthritis Mother   . Depression Mother   . Arthritis Father   . Depression Father   . Alcohol abuse Father     Social History Social History   Tobacco Use  . Smoking status: Never Smoker  . Smokeless tobacco: Former Network engineer Use Topics  . Alcohol use: No    Comment: rare-stopped with pregnancy  . Drug use: No     Allergies   Erythromycin, Paroxetine, and Sulfonamide derivatives   Review of Systems Review of Systems   Physical Exam Triage Vital Signs ED Triage Vitals  Enc Vitals Group     BP 06/12/19 0829 (!) 132/91     Pulse Rate 06/12/19 0829 90     Resp 06/12/19 0829 20     Temp 06/12/19 0829 98.1 F (36.7 C)     Temp Source 06/12/19 0829 Oral     SpO2 06/12/19 0829 99 %     Weight --      Height --      Head Circumference --      Peak Flow --      Pain Score 06/12/19 0830 8     Pain Loc --      Pain Edu? --      Excl. in [place]? --    No data found.  Updated Vital Signs BP (!) 132/91 (BP Location: Left Arm)   Pulse 90   Temp 98.1 F (36.7 C) (Oral)   Resp 20   SpO2 99%    Physical Exam Constitutional:      General: She is not in acute distress.    Appearance: She is well-developed.  Cardiovascular:     Rate and Rhythm: Normal rate and regular rhythm.     Heart sounds: Normal heart sounds.  Pulmonary:     Effort: Pulmonary effort is normal. No respiratory distress.     Breath sounds: Normal breath sounds.  Abdominal:     Tenderness: There is abdominal tenderness in the epigastric area.  Skin:    General: Skin is warm  and dry.  Neurological:  Mental Status: She is alert and oriented to person, place, and time.    EKG:  NSR rate of 82. Previous EKG was not available for review. No stwave changes as interpreted by me.    UC Treatments / Results  Labs (all labs ordered are listed, but only abnormal results are displayed) Labs Reviewed - No data to display  EKG   Radiology DG Chest 2 View  Result Date: 06/12/2019 CLINICAL DATA:  Chest pain. Additional history provided: Mid-sternal chest pain radiating to right arm. EXAM: CHEST - 2 VIEW COMPARISON:  Chest radiograph 11/23/2012. FINDINGS: Heart size within normal limits. There is no evidence of airspace consolidation within the lungs. No evidence of pleural effusion or pneumothorax. No acute bony abnormality. IMPRESSION: No evidence of acute cardiopulmonary abnormality. Electronically Signed   By: Kellie Simmering DO   On: 06/12/2019 09:04    Procedures Procedures (including critical care time)  Medications Ordered in UC Medications  ketorolac (TORADOL) injection 60 mg (has no administration in time range)  alum & mag hydroxide-simeth (MAALOX/MYLANTA) 200-200-20 MG/5ML suspension 30 mL (30 mLs Oral Given 06/12/19 0907)    And  lidocaine (XYLOCAINE) 2 % viscous mouth solution 15 mL (15 mLs Oral Given 06/12/19 0907)    Initial Impression / Assessment and Plan / UC Course  I have reviewed the triage vital signs and the nursing notes.  Pertinent labs & imaging results that were available during my care of the patient were reviewed by me and considered in my medical decision making (see chart for details).     Epigastric and mid sternal pain which woke patient this morning after eating steak for dinner last night. No red flag findings on exam, reproducible epigastric pain on palpation. Ekg, chest xray and vitals are reassuring. Gi cocktail without much relief in clinic here today. Does take nsaids regularly unfortunately due to recurrent migraines. Suspect  reflux likely contributing to symptoms. toradol provided im today to help with migraine in order to limit oral nsaids over the next few days, with reflux diet recommendations discussed and provided as well. Return precautions provided. Encouraged follow up with pcp for recheck, start of protonix now. Patient verbalized understanding and agreeable to plan.   Final Clinical Impressions(s) / UC Diagnoses   Final diagnoses:  Nonspecific chest pain  Abdominal pain, epigastric  Migraine without status migrainosus, not intractable, unspecified migraine type     Discharge Instructions     Your chest xray, ekg and exam are overall reassuring here today.  I would like to try start a PPI to see if this is helpful with your symptoms.  See diet recommendations as well.  Limit NSAIDS as able to prevent symptoms as well.  If worsening of pain, develop nausea, palpitations, shortness of breath, dizziness, or otherwise worsening please go to the ER.  If persistent or recurrent please follow up with your PCP.     ED Prescriptions    Medication Sig Dispense Auth. Provider   pantoprazole (PROTONIX) 20 MG tablet Take 1 tablet (20 mg total) by mouth daily. 30 tablet Zigmund Gottron, NP     PDMP not reviewed this encounter.   Zigmund Gottron, NP 06/12/19 [phone]

## 2019-06-12 NOTE — ED Triage Notes (Addendum)
Pt c/o mid sternal CP that awoke her at approx 0200. Describes as pressure and tightness that radiates to right shoulder. Also c/o mild nausea, SOB and headache, reports h/o of migraines. Also states she had some "sweating and feeling hot with it". Denies recent increase in stress/anxiety; negative for h/o of gerd.   States took Tums w/o any improvement. Denies any recent illness.

## 2019-06-13 ENCOUNTER — Telehealth (HOSPITAL_COMMUNITY): Payer: Self-pay | Admitting: Emergency Medicine

## 2019-06-13 ENCOUNTER — Other Ambulatory Visit: Payer: Self-pay | Admitting: Physician Assistant

## 2019-06-13 DIAGNOSIS — M62838 Other muscle spasm: Secondary | ICD-10-CM

## 2019-06-13 NOTE — Telephone Encounter (Signed)
Pt called stating her chest pain was still constant despite medications prescribed. Pt instructed to go to ER for further evaluation.

## 2019-06-16 ENCOUNTER — Other Ambulatory Visit: Payer: Self-pay | Admitting: Physician Assistant

## 2019-06-16 ENCOUNTER — Ambulatory Visit (INDEPENDENT_AMBULATORY_CARE_PROVIDER_SITE_OTHER): Payer: Medicaid Other | Admitting: Physician Assistant

## 2019-06-16 ENCOUNTER — Encounter: Payer: Self-pay | Admitting: Physician Assistant

## 2019-06-16 ENCOUNTER — Other Ambulatory Visit: Payer: Self-pay

## 2019-06-16 VITALS — BP 122/84 | HR 111 | Temp 97.3°F | Ht 64.0 in | Wt 182.8 lb

## 2019-06-16 DIAGNOSIS — Z0000 Encounter for general adult medical examination without abnormal findings: Secondary | ICD-10-CM | POA: Diagnosis not present

## 2019-06-16 DIAGNOSIS — R1013 Epigastric pain: Secondary | ICD-10-CM

## 2019-06-16 MED ORDER — SUCRALFATE 1 G PO TABS: 1.0000 g | ORAL_TABLET | Freq: Three times a day (TID) | ORAL | 2 refills | Status: DC

## 2019-06-16 MED ORDER — PANTOPRAZOLE SODIUM 40 MG PO TBEC: 40.0000 mg | DELAYED_RELEASE_TABLET | Freq: Every day | ORAL | 0 refills | Status: DC

## 2019-06-16 NOTE — Progress Notes (Signed)
Patient: Sharon King, Female    DOB: 1985/07/08, 34 y.o.   MRN: [id number] Visit Date: 06/16/2019  Today's Provider: Trinna Post, PA-C   Chief Complaint  Patient presents with  . Annual Exam   Subjective:    Annual physical exam Sharon King is a 34 y.o. female who presents today for health maintenance and complete physical. She feels fairly well (side pain). She reports exercising none. She reports she is sleeping poorly.  Last pap:09/18/2016 normal and HPV negative.   Was in the ER on 06/12/2019 for epigastric pain that woke her in the middle of the night after eating a steak dinner. Her EKG was normal. She has a history of migraines and uses NSAIDs once every day or every other day. She is currently on elavil for prophylaxis and sumatriptan 100 mg daily for abortive therapy. Attempted emgality but was not approved by insurance. Attempted reyvow for abortive therapy but this was also not approved by insurance. She reports epigastric pain that occurs half an hour after eating meals. She endorses nausea and vomiting. Denies fevers, chills. Denies history of gallstones.   She continues to take tizanidine for migraines.   -----------------------------------------------------------------    Review of Systems  Constitutional: Positive for activity change and appetite change.  HENT: Negative.   Eyes: Negative.   Respiratory: Negative.   Cardiovascular: Positive for chest pain.  Gastrointestinal: Positive for abdominal distention, abdominal pain and nausea.  Endocrine: Negative.   Genitourinary: Negative.   Musculoskeletal: Negative.   Skin: Negative.   Allergic/Immunologic: Negative.   Neurological: Negative.   Hematological: Negative.   Psychiatric/Behavioral: Negative.     Social History She  reports that she has never smoked. She has quit using smokeless tobacco. She reports that she does not drink alcohol or use drugs. Social History    Socioeconomic History  . Marital status: Legally Separated    Spouse name: Not on file  . Number of children: Not on file  . Years of education: Not on file  . Highest education level: Not on file  Occupational History  . Not on file  Tobacco Use  . Smoking status: Never Smoker  . Smokeless tobacco: Former Network engineer and Sexual Activity  . Alcohol use: No    Comment: rare-stopped with pregnancy  . Drug use: No  . Sexual activity: Not Currently    Birth control/protection: Pill  Other Topics Concern  . Not on file  Social History Narrative  . Not on file   Social Determinants of Health   Financial Resource Strain:   . Difficulty of Paying Living Expenses: Not on file  Food Insecurity:   . Worried About Charity fundraiser in the Last Year: Not on file  . Ran Out of Food in the Last Year: Not on file  Transportation Needs:   . Lack of Transportation (Medical): Not on file  . Lack of Transportation (Non-Medical): Not on file  Physical Activity:   . Days of Exercise per Week: Not on file  . Minutes of Exercise per Session: Not on file  Stress:   . Feeling of Stress : Not on file  Social Connections:   . Frequency of Communication with Friends and Family: Not on file  . Frequency of Social Gatherings with Friends and Family: Not on file  . Attends Religious Services: Not on file  . Active Member of Clubs or Organizations: Not on file  . Attends Archivist  Meetings: Not on file  . Marital Status: Not on file    Patient Active Problem List   Diagnosis Date Noted  . Left shoulder pain 03/04/2018  . Muscle spasm 02/18/2016  . ALLERGIC RHINITIS 04/22/2007  . ASTHMA 11/06/2006  . Migraines 11/06/2006    Past Surgical History:  Procedure Laterality Date  . BREAST ENHANCEMENT SURGERY  2008  . BREAST SURGERY    . CESAREAN SECTION N/A 06/13/2015   Procedure: CESAREAN SECTION;  Surgeon: Lavonia Drafts, MD;  Location: [facility];  Service: Obstetrics;   Laterality: N/A;  . COSMETIC SURGERY    . wisdom teeth      Family History  Family Status  Relation Name Status  . Mother  Alive  . Father  Alive   Her family history includes Alcohol abuse in her father; Arthritis in her father and mother; Depression in her father and mother; Hyperlipidemia in her mother.     Allergies  Allergen Reactions  . Erythromycin Rash  . Paroxetine Palpitations  . Sulfonamide Derivatives Rash    Previous Medications   AMITRIPTYLINE (ELAVIL) 50 MG TABLET    TAKE 1 TABLET BY MOUTH EVERYDAY AT BEDTIME   GABAPENTIN (NEURONTIN) 300 MG CAPSULE    Take 1 capsule (300 mg total) by mouth 3 (three) times daily.   GALCANEZUMAB-GNLM (EMGALITY) 120 MG/ML SOAJ    Inject 240 mg into the skin as directed AND 120 mg every 30 (thirty) days. Inj 240mg  once then 120mg  monthly.   LASMIDITAN SUCCINATE (REYVOW) 100 MG TABS    Take 100 mg by mouth as needed (migraine).   NITROFURANTOIN, MACROCRYSTAL-MONOHYDRATE, (MACROBID) 100 MG CAPSULE    Take 1 capsule (100 mg total) by mouth 2 (two) times daily.   NORGESTIMATE-ETHINYL ESTRADIOL (ORTHO-CYCLEN) 0.25-35 MG-MCG TABLET    Take 1 tablet by mouth daily. Take active pills in a continuous fashion. Have a withdrawal bleed once every 3-6 months   PANTOPRAZOLE (PROTONIX) 20 MG TABLET    Take 1 tablet (20 mg total) by mouth daily.   PROMETHAZINE (PHENERGAN) 12.5 MG TABLET    Take 1 tablet (12.5 mg total) by mouth every 6 (six) hours as needed for nausea or vomiting.   SUMATRIPTAN (IMITREX) 100 MG TABLET    Take 1 tablet (100 mg total) by mouth once as needed for up to 1 dose for migraine. May repeat in 2 hours if headache persists or recurs.   SUMATRIPTAN SUCCINATE REFILL 6 MG/0.5ML SOCT       TIZANIDINE (ZANAFLEX) 4 MG TABLET    TAKE TWO TABLETS BY MOUTH AT BEDTIME   ZOLPIDEM (AMBIEN) 5 MG TABLET    Take 1 tablet (5 mg total) by mouth at bedtime as needed for sleep.    Patient Care Team: Paulene Floor as PCP - General  (Physician Assistant) Susa Day, MD as Consulting Physician (Orthopedic Surgery)      Objective:   Vitals: BP 122/84 (BP Location: Right Arm, Patient Position: Sitting, Cuff Size: Normal)   Pulse (!) 111   Temp (!) 97.3 F (36.3 C) (Temporal)   Ht 5\' 4"  (1.626 m)   Wt 182 lb 12.8 oz (82.9 kg)   SpO2 97%   BMI 31.38 kg/m    Physical Exam Constitutional:      Appearance: Normal appearance. She is not ill-appearing.  Cardiovascular:     Rate and Rhythm: Normal rate and regular rhythm.     Heart sounds: Normal heart sounds.  Pulmonary:     Effort:  Pulmonary effort is normal.     Breath sounds: Normal breath sounds.  Abdominal:     General: Abdomen is flat. Bowel sounds are normal.     Tenderness: There is no abdominal tenderness. There is no guarding or rebound.  Skin:    General: Skin is warm and dry.  Neurological:     Mental Status: She is alert and oriented to person, place, and time. Mental status is at baseline.  Psychiatric:        Mood and Affect: Mood normal.        Behavior: Behavior normal.      Depression Screen PHQ 2/9 Scores 06/16/2019 02/18/2018 06/07/2015 05/10/2015  PHQ - 2 Score 0 0 0 0  PHQ- 9 Score 3 1 - -      Assessment & Plan:     Routine Health Maintenance and Physical Exam  Exercise Activities and Dietary recommendations Goals   None     Immunization History  Administered Date(s) Administered  . Influenza Whole 04/14/2005  . Influenza,inj,Quad PF,6+ Mos 12/13/2014, 02/13/2019  . Tdap 05/15/2015    Health Maintenance  Topic Date Due  . PAP SMEAR-Modifier  09/18/2021  . TETANUS/TDAP  05/14/2025  . INFLUENZA VACCINE  Completed  . HIV Screening  Completed     Discussed health benefits of physical activity, and encouraged her to engage in regular exercise appropriate for her age and condition.    1. Annual physical exam  - Comprehensive Metabolic Panel (CMET) - CBC with Differential - TSH - Lipid Profile  2. Epigastric  pain  Advised to take 40 mg protonix QD x 8 weeks for potential ulcer treatment. Rx for sucralfate given. Must also consider gallstones, and ultrasound/labs ordered. If negative for gallstones and pain persists despite treatment for ulcer, refer to GI for evaluation for H. Pylori. Explained the inaccuracy of serology of H. Pylori and the need for PPI washout period for breath test. Most accurate testing at this point would be endoscopy with biopsy.   - sucralfate (CARAFATE) 1 g tablet; Take 1 tablet (1 g total) by mouth 4 (four) times daily -  with meals and at bedtime.  Dispense: 120 tablet; Refill: 2 - US Abdomen Limited RUQ; Future - Lipase - Amylase - pantoprazole (PROTONIX) 40 MG tablet; Take 1 tablet (40 mg total) by mouth daily.  Dispense: 90 tablet; Refill: 0  The entirety of the information documented in the History of Present Illness, Review of Systems and Physical Exam were personally obtained by me. Portions of this information were initially documented by [facility] and reviewed by me for thoroughness and accuracy.   --------------------------------------------------------------------

## 2019-06-17 LAB — COMPREHENSIVE METABOLIC PANEL
ALT: 11 IU/L (ref 0–32)
AST: 12 IU/L (ref 0–40)
Albumin/Globulin Ratio: 1.3 (ref 1.2–2.2)
Albumin: 3.9 g/dL (ref 3.8–4.8)
Alkaline Phosphatase: 86 IU/L (ref 39–117)
BUN/Creatinine Ratio: 13 (ref 9–23)
BUN: 9 mg/dL (ref 6–20)
Bilirubin Total: <0.2 mg/dL (ref 0.0–1.2)
CO2: 21 mmol/L (ref 20–29)
Calcium: 9.1 mg/dL (ref 8.7–10.2)
Chloride: 101 mmol/L (ref 96–106)
Creatinine, Ser: 0.69 mg/dL (ref 0.57–1.00)
GFR calc Af Amer: 133 mL/min/{1.73_m2} (ref >59)
GFR calc non Af Amer: 116 mL/min/{1.73_m2} (ref >59)
Globulin, Total: 3 g/dL (ref 1.5–4.5)
Glucose: 95 mg/dL (ref 65–99)
Potassium: 4.2 mmol/L (ref 3.5–5.2)
Sodium: 139 mmol/L (ref 134–144)
Total Protein: 6.9 g/dL (ref 6.0–8.5)

## 2019-06-17 LAB — CBC WITH DIFFERENTIAL/PLATELET
Basophils Absolute: 0.1 10*3/uL (ref 0.0–0.2)
Basos: 1 %
EOS (ABSOLUTE): 0.2 10*3/uL (ref 0.0–0.4)
Eos: 2 %
Hematocrit: 39.3 % (ref 34.0–46.6)
Hemoglobin: 13.1 g/dL (ref 11.1–15.9)
Immature Grans (Abs): 0 10*3/uL (ref 0.0–0.1)
Immature Granulocytes: 0 %
Lymphocytes Absolute: 2.6 10*3/uL (ref 0.7–3.1)
Lymphs: 30 %
MCH: 30 pg (ref 26.6–33.0)
MCHC: 33.3 g/dL (ref 31.5–35.7)
MCV: 90 fL (ref 79–97)
Monocytes Absolute: 0.6 10*3/uL (ref 0.1–0.9)
Monocytes: 7 %
Neutrophils Absolute: 5.1 10*3/uL (ref 1.4–7.0)
Neutrophils: 60 %
Platelets: 418 10*3/uL (ref 150–450)
RBC: 4.36 x10E6/uL (ref 3.77–5.28)
RDW: 13 % (ref 11.7–15.4)
WBC: 8.5 10*3/uL (ref 3.4–10.8)

## 2019-06-17 LAB — LIPID PANEL
Chol/HDL Ratio: 4.9 ratio — ABNORMAL HIGH (ref 0.0–4.4)
Cholesterol, Total: 278 mg/dL — ABNORMAL HIGH (ref 100–199)
HDL: 57 mg/dL (ref >39)
LDL Chol Calc (NIH): 185 mg/dL — ABNORMAL HIGH (ref 0–99)
Triglycerides: 193 mg/dL — ABNORMAL HIGH (ref 0–149)
VLDL Cholesterol Cal: 36 mg/dL (ref 5–40)

## 2019-06-17 LAB — TSH: TSH: 4.15 u[IU]/mL (ref 0.450–4.500)

## 2019-06-17 LAB — LIPASE: Lipase: 32 U/L (ref 14–72)

## 2019-06-21 ENCOUNTER — Encounter: Payer: Self-pay | Admitting: Physician Assistant

## 2019-06-21 ENCOUNTER — Telehealth: Payer: Self-pay | Admitting: Physician Assistant

## 2019-06-21 DIAGNOSIS — R1013 Epigastric pain: Secondary | ICD-10-CM

## 2019-06-21 DIAGNOSIS — R112 Nausea with vomiting, unspecified: Secondary | ICD-10-CM

## 2019-06-23 ENCOUNTER — Encounter: Payer: Self-pay | Admitting: Radiology

## 2019-06-23 ENCOUNTER — Other Ambulatory Visit: Payer: Medicaid Other

## 2019-06-23 ENCOUNTER — Ambulatory Visit (INDEPENDENT_AMBULATORY_CARE_PROVIDER_SITE_OTHER): Payer: Medicaid Other | Admitting: Physician Assistant

## 2019-06-23 ENCOUNTER — Other Ambulatory Visit: Payer: Self-pay

## 2019-06-23 ENCOUNTER — Encounter: Payer: Self-pay | Admitting: Physician Assistant

## 2019-06-23 VITALS — BP 122/84 | HR 108 | Wt 182.0 lb

## 2019-06-23 DIAGNOSIS — G43709 Chronic migraine without aura, not intractable, without status migrainosus: Secondary | ICD-10-CM | POA: Diagnosis not present

## 2019-06-23 DIAGNOSIS — IMO0002 Reserved for concepts with insufficient information to code with codable children: Secondary | ICD-10-CM

## 2019-06-23 MED ORDER — REYVOW 100 MG PO TABS: 100.0000 mg | ORAL_TABLET | Freq: Once | ORAL | 2 refills | Status: DC

## 2019-06-23 MED ORDER — TOPIRAMATE 25 MG PO TABS: 75.0000 mg | ORAL_TABLET | Freq: Every day | ORAL | 5 refills | Status: DC

## 2019-06-23 MED ORDER — PROMETHAZINE HCL 12.5 MG PO TABS: 12.5000 mg | ORAL_TABLET | Freq: Four times a day (QID) | ORAL | 0 refills | Status: DC | PRN

## 2019-06-23 NOTE — Progress Notes (Signed)
History:  Sharon King is a 34 y.o. G1P0101 who presents to clinic today for followup of migraines.  She tried emgality but discontinued it 1.5 months ago as she had weight gain and hair loss that she feels are related to the medication.  For the last 2 weeks she has had significant pain in her right abdomen.  She is under eval for gall bladder vs. Appendix.  Reyvow was denied and has not been tried.   She cannot use NSAIDs with current abd pain.  Prior h/o using Relpax, Frova, Maxalt, Imitrex, Amerge, Axert, Zomig for acute therapy of migraine. Still uses Imitrex and that is helpful although it causes drowsiness.  She did co-administer with Advil prior to her current pain. She is still using amitriptyline for prevention.  She has used Topamax previously, along with gabapentin and propanolol for prevention. Topamax dose was high (?400mg ) and she worried about seizure activity.  She is willing to try again but wants to keep dosing 100mg . Tramadol was also used for acute treatment.       HIT6:62 Number of days in the last 4 weeks with:  Severe headache: 8 Moderate headache: 6 Mild headache: 10  No headache: 4   Past Medical History:  Diagnosis Date  . Allergy   . Anxiety   . Asthma   . Cancer ([place])   . GERD (gastroesophageal reflux disease)   . History of preterm delivery   . Migraines   . Neuromuscular disorder ([place])   . Seizures ([place])    Last seizure 2014  . Sleep apnea   . Trigeminal neuralgia     Social History   Socioeconomic History  . Marital status: Legally Separated    Spouse name: Not on file  . Number of children: Not on file  . Years of education: Not on file  . Highest education level: Not on file  Occupational History  . Not on file  Tobacco Use  . Smoking status: Never Smoker  . Smokeless tobacco: Former Network engineer and Sexual Activity  . Alcohol use: No    Comment: rare-stopped with pregnancy  . Drug use: No  . Sexual activity: Not  Currently    Birth control/protection: Pill  Other Topics Concern  . Not on file  Social History Narrative  . Not on file   Social Determinants of Health   Financial Resource Strain:   . Difficulty of Paying Living Expenses:   Food Insecurity:   . Worried About Charity fundraiser in the Last Year:   . Arboriculturist in the Last Year:   Transportation Needs:   . Film/video editor (Medical):   Marland Kitchen Lack of Transportation (Non-Medical):   Physical Activity:   . Days of Exercise per Week:   . Minutes of Exercise per Session:   Stress:   . Feeling of Stress :   Social Connections:   . Frequency of Communication with Friends and Family:   . Frequency of Social Gatherings with Friends and Family:   . Attends Religious Services:   . Active Member of Clubs or Organizations:   . Attends Archivist Meetings:   Marland Kitchen Marital Status:   Intimate Partner Violence:   . Fear of Current or Ex-Partner:   . Emotionally Abused:   Marland Kitchen Physically Abused:   . Sexually Abused:     Family History  Problem Relation Age of Onset  . Hyperlipidemia Mother   . Arthritis Mother   .  Depression Mother   . Arthritis Father   . Depression Father   . Alcohol abuse Father     Allergies  Allergen Reactions  . Erythromycin Rash  . Paroxetine Palpitations  . Sulfonamide Derivatives Rash    Current Outpatient Medications on File Prior to Visit  Medication Sig Dispense Refill  . amitriptyline (ELAVIL) 50 MG tablet TAKE 1 TABLET BY MOUTH EVERYDAY AT BEDTIME 30 tablet 11  . norgestimate-ethinyl estradiol (ORTHO-CYCLEN) 0.25-35 MG-MCG tablet Take 1 tablet by mouth daily. Take active pills in a continuous fashion. Have a withdrawal bleed once every 3-6 months 3 Package 1  . pantoprazole (PROTONIX) 40 MG tablet Take 1 tablet (40 mg total) by mouth daily. 90 tablet 0  . promethazine (PHENERGAN) 12.5 MG tablet Take 1 tablet (12.5 mg total) by mouth every 6 (six) hours as needed for nausea or  vomiting. 30 tablet 0  . sucralfate (CARAFATE) 1 g tablet Take 1 tablet (1 g total) by mouth 4 (four) times daily -  with meals and at bedtime. 120 tablet 2  . SUMAtriptan (IMITREX) 100 MG tablet Take 1 tablet (100 mg total) by mouth once as needed for up to 1 dose for migraine. May repeat in 2 hours if headache persists or recurs. 9 tablet 11  . tiZANidine (ZANAFLEX) 4 MG tablet TAKE TWO TABLETS BY MOUTH AT BEDTIME 60 tablet 0   No current facility-administered medications on file prior to visit.     Review of Systems:  All pertinent positive/negative included in HPI, all other review of systems are negative   Objective:  Physical Exam BP 122/84   Pulse (!) 108   Wt 182 lb (82.6 kg)   BMI 31.24 kg/m  CONSTITUTIONAL: Well-developed, well-nourished female in no acute distress.  EYES: EOM intact ENT: Normocephalic CARDIOVASCULAR: Regular rate  RESPIRATORY: Normal rate.  MUSCULOSKELETAL: Normal ROM SKIN: Warm, dry without erythema  NEUROLOGICAL: Alert, oriented, CN II-XII grossly intact, Appropriate balance   PSYCH: Normal behavior, mood   Assessment & Plan:  Assessment: Chronic migraine - unimproved  Plan: Restart Topamax - titrate to 75mg  nightly as tolerated Must stay on contraception while using this medication Reyvow - samples provided for pt to try - will fill rx if it is useful.  Pt reminded not to take with triptan AND must have 8 hours of no driving/operation of heavy machinery after taking.   Work note provided Continue amitriptyline Samples provided of Nurtec for pt to try as needed.  Can send Rx if desired.  RTC 6 months. Follow-up in 29months or sooner PRN  Lacie Draft 06/23/2019 8:18 AM

## 2019-06-26 ENCOUNTER — Ambulatory Visit: Payer: Medicaid Other

## 2019-06-27 ENCOUNTER — Encounter: Payer: Medicaid Other | Admitting: Physician Assistant

## 2019-06-29 ENCOUNTER — Encounter: Payer: Self-pay | Admitting: Gastroenterology

## 2019-07-03 ENCOUNTER — Encounter: Payer: Self-pay | Admitting: *Deleted

## 2019-07-06 ENCOUNTER — Encounter: Payer: Self-pay | Admitting: *Deleted

## 2019-07-06 ENCOUNTER — Telehealth: Payer: Self-pay | Admitting: *Deleted

## 2019-07-06 NOTE — Telephone Encounter (Signed)
Called [facility] to follow up on PA sent in for Reyvow, this has been approved from 07/03/2019--12/30/2019. Pharmacy informed

## 2019-07-14 ENCOUNTER — Encounter: Payer: Self-pay | Admitting: Physician Assistant

## 2019-07-22 ENCOUNTER — Other Ambulatory Visit: Payer: Self-pay | Admitting: Physician Assistant

## 2019-08-01 ENCOUNTER — Encounter: Payer: Self-pay | Admitting: Radiology

## 2019-08-02 ENCOUNTER — Inpatient Hospital Stay (HOSPITAL_COMMUNITY)
Admission: EM | Admit: 2019-08-02 | Discharge: 2019-08-03 | DRG: 379 | Discharge status: Against Medical Advice | Payer: Medicaid Other | Attending: Internal Medicine | Admitting: Internal Medicine

## 2019-08-02 ENCOUNTER — Encounter (HOSPITAL_COMMUNITY): Payer: Self-pay | Admitting: Emergency Medicine

## 2019-08-02 ENCOUNTER — Emergency Department (HOSPITAL_COMMUNITY): Payer: Medicaid Other

## 2019-08-02 ENCOUNTER — Other Ambulatory Visit: Payer: Self-pay

## 2019-08-02 DIAGNOSIS — Z8261 Family history of arthritis: Secondary | ICD-10-CM | POA: Diagnosis not present

## 2019-08-02 DIAGNOSIS — K922 Gastrointestinal hemorrhage, unspecified: Secondary | ICD-10-CM

## 2019-08-02 DIAGNOSIS — K219 Gastro-esophageal reflux disease without esophagitis: Secondary | ICD-10-CM | POA: Diagnosis present

## 2019-08-02 DIAGNOSIS — Z20822 Contact with and (suspected) exposure to covid-19: Secondary | ICD-10-CM | POA: Diagnosis present

## 2019-08-02 DIAGNOSIS — Z888 Allergy status to other drugs, medicaments and biological substances status: Secondary | ICD-10-CM

## 2019-08-02 DIAGNOSIS — J45909 Unspecified asthma, uncomplicated: Secondary | ICD-10-CM | POA: Diagnosis present

## 2019-08-02 DIAGNOSIS — G4733 Obstructive sleep apnea (adult) (pediatric): Secondary | ICD-10-CM | POA: Diagnosis present

## 2019-08-02 DIAGNOSIS — Z811 Family history of alcohol abuse and dependence: Secondary | ICD-10-CM

## 2019-08-02 DIAGNOSIS — Z882 Allergy status to sulfonamides status: Secondary | ICD-10-CM | POA: Diagnosis not present

## 2019-08-02 DIAGNOSIS — K8020 Calculus of gallbladder without cholecystitis without obstruction: Secondary | ICD-10-CM | POA: Diagnosis present

## 2019-08-02 DIAGNOSIS — F419 Anxiety disorder, unspecified: Secondary | ICD-10-CM | POA: Diagnosis present

## 2019-08-02 DIAGNOSIS — Z818 Family history of other mental and behavioral disorders: Secondary | ICD-10-CM | POA: Diagnosis not present

## 2019-08-02 DIAGNOSIS — Z881 Allergy status to other antibiotic agents status: Secondary | ICD-10-CM

## 2019-08-02 DIAGNOSIS — K920 Hematemesis: Secondary | ICD-10-CM | POA: Diagnosis present

## 2019-08-02 DIAGNOSIS — Z83438 Family history of other disorder of lipoprotein metabolism and other lipidemia: Secondary | ICD-10-CM

## 2019-08-02 DIAGNOSIS — R569 Unspecified convulsions: Secondary | ICD-10-CM | POA: Diagnosis present

## 2019-08-02 DIAGNOSIS — T39395A Adverse effect of other nonsteroidal anti-inflammatory drugs [NSAID], initial encounter: Secondary | ICD-10-CM | POA: Diagnosis present

## 2019-08-02 DIAGNOSIS — R079 Chest pain, unspecified: Secondary | ICD-10-CM

## 2019-08-02 DIAGNOSIS — R1013 Epigastric pain: Secondary | ICD-10-CM

## 2019-08-02 DIAGNOSIS — G43909 Migraine, unspecified, not intractable, without status migrainosus: Secondary | ICD-10-CM | POA: Diagnosis present

## 2019-08-02 DIAGNOSIS — G43709 Chronic migraine without aura, not intractable, without status migrainosus: Secondary | ICD-10-CM | POA: Diagnosis present

## 2019-08-02 HISTORY — DX: Gastrointestinal hemorrhage, unspecified: K92.2

## 2019-08-02 LAB — COMPREHENSIVE METABOLIC PANEL
ALT: 15 U/L (ref 0–44)
AST: 17 U/L (ref 15–41)
Albumin: 3.4 g/dL — ABNORMAL LOW (ref 3.5–5.0)
Alkaline Phosphatase: 81 U/L (ref 38–126)
Anion gap: 9 (ref 5–15)
BUN: 7 mg/dL (ref 6–20)
CO2: 24 mmol/L (ref 22–32)
Calcium: 8.9 mg/dL (ref 8.9–10.3)
Chloride: 106 mmol/L (ref 98–111)
Creatinine, Ser: 0.71 mg/dL (ref 0.44–1.00)
GFR calc Af Amer: >60 mL/min (ref >60)
GFR calc non Af Amer: >60 mL/min (ref >60)
Glucose, Bld: 113 mg/dL — ABNORMAL HIGH (ref 70–99)
Potassium: 3.7 mmol/L (ref 3.5–5.1)
Sodium: 139 mmol/L (ref 135–145)
Total Bilirubin: 0.4 mg/dL (ref 0.3–1.2)
Total Protein: 7.3 g/dL (ref 6.5–8.1)

## 2019-08-02 LAB — I-STAT BETA HCG BLOOD, ED (MC, WL, AP ONLY): I-stat hCG, quantitative: <5 m[IU]/mL (ref <5)

## 2019-08-02 LAB — CBC
HCT: 43 % (ref 36.0–46.0)
Hemoglobin: 13.6 g/dL (ref 12.0–15.0)
MCH: 28.8 pg (ref 26.0–34.0)
MCHC: 31.6 g/dL (ref 30.0–36.0)
MCV: 91.1 fL (ref 80.0–100.0)
Platelets: 453 10*3/uL — ABNORMAL HIGH (ref 150–400)
RBC: 4.72 MIL/uL (ref 3.87–5.11)
RDW: 13.2 % (ref 11.5–15.5)
WBC: 8.5 10*3/uL (ref 4.0–10.5)
nRBC: 0 % (ref 0.0–0.2)

## 2019-08-02 LAB — SARS CORONAVIRUS 2 (TAT 6-24 HRS): SARS Coronavirus 2: NEGATIVE

## 2019-08-02 LAB — TYPE AND SCREEN
ABO/RH(D): A POS
Antibody Screen: NEGATIVE

## 2019-08-02 LAB — POC OCCULT BLOOD, ED: Fecal Occult Bld: NEGATIVE

## 2019-08-02 LAB — LIPASE, BLOOD: Lipase: 30 U/L (ref 11–51)

## 2019-08-02 LAB — ABO/RH: ABO/RH(D): A POS

## 2019-08-02 MED ORDER — SODIUM CHLORIDE 0.9 % IV SOLN
8.0000 mg/h | INTRAVENOUS | Status: DC
  Administered 2019-08-02: 8 mg/h via INTRAVENOUS
  Filled 2019-08-02 (×3): qty 80

## 2019-08-02 MED ORDER — PANTOPRAZOLE SODIUM 40 MG IV SOLR: 40.0000 mg | Freq: Two times a day (BID) | INTRAVENOUS | Status: DC

## 2019-08-02 MED ORDER — MORPHINE SULFATE (PF) 4 MG/ML IV SOLN
4.0000 mg | Freq: Once | INTRAVENOUS | Status: AC
  Administered 2019-08-02: 4 mg via INTRAVENOUS
  Filled 2019-08-02: qty 1

## 2019-08-02 MED ORDER — IOHEXOL 300 MG/ML  SOLN
100.0000 mL | Freq: Once | INTRAMUSCULAR | Status: AC | PRN
  Administered 2019-08-02: 100 mL via INTRAVENOUS

## 2019-08-02 MED ORDER — SODIUM CHLORIDE 0.9 % IV BOLUS
1000.0000 mL | Freq: Once | INTRAVENOUS | Status: AC
  Administered 2019-08-02: 1000 mL via INTRAVENOUS

## 2019-08-02 MED ORDER — ONDANSETRON HCL 4 MG/2ML IJ SOLN
4.0000 mg | Freq: Once | INTRAMUSCULAR | Status: AC
  Administered 2019-08-02: 4 mg via INTRAVENOUS
  Filled 2019-08-02: qty 2

## 2019-08-02 MED ORDER — ALUM & MAG HYDROXIDE-SIMETH 200-200-20 MG/5ML PO SUSP
30.0000 mL | Freq: Once | ORAL | Status: AC
  Administered 2019-08-02: 30 mL via ORAL
  Filled 2019-08-02: qty 30

## 2019-08-02 MED ORDER — TIZANIDINE HCL 4 MG PO TABS
4.0000 mg | ORAL_TABLET | Freq: Every evening | ORAL | Status: DC | PRN
  Administered 2019-08-02: 4 mg via ORAL
  Filled 2019-08-02 (×3): qty 1

## 2019-08-02 MED ORDER — TOPIRAMATE 25 MG PO TABS
75.0000 mg | ORAL_TABLET | Freq: Every day | ORAL | Status: DC
  Administered 2019-08-02: 75 mg via ORAL
  Filled 2019-08-02: qty 3

## 2019-08-02 MED ORDER — HYDROMORPHONE HCL 1 MG/ML IJ SOLN
1.0000 mg | Freq: Once | INTRAMUSCULAR | Status: AC
  Administered 2019-08-02: 1 mg via INTRAVENOUS
  Filled 2019-08-02: qty 1

## 2019-08-02 MED ORDER — LIDOCAINE VISCOUS HCL 2 % MT SOLN
15.0000 mL | Freq: Once | OROMUCOSAL | Status: AC
  Administered 2019-08-02: 15 mL via ORAL
  Filled 2019-08-02: qty 15

## 2019-08-02 MED ORDER — HYDROMORPHONE HCL 1 MG/ML IJ SOLN
0.5000 mg | INTRAMUSCULAR | Status: DC | PRN
  Administered 2019-08-02: 0.5 mg via INTRAVENOUS
  Filled 2019-08-02: qty 1

## 2019-08-02 MED ORDER — PANTOPRAZOLE SODIUM 40 MG IV SOLR
40.0000 mg | Freq: Once | INTRAVENOUS | Status: AC
  Administered 2019-08-02: 40 mg via INTRAVENOUS
  Filled 2019-08-02: qty 40

## 2019-08-02 NOTE — ED Notes (Signed)
Pt asking about outpatient options, wanting to leave. This RN explained that pt is being advised to stay, informed pt I would ask MD to talk to her. MD Avon Gully made aware.

## 2019-08-02 NOTE — ED Triage Notes (Signed)
Pt states she had 1 episode of brb emesis this morning. States she was diagnosed with a stomach ulcer a while back and has not been able to get in with GI yet. Still taking protonix and carafate. Endorses generalized abd pain.

## 2019-08-02 NOTE — ED Provider Notes (Signed)
[facility] EMERGENCY DEPARTMENT Provider Note   CSN: [id number] Arrival date & time: 08/02/19  1030   History Chief Complaint  Patient presents with  . Hematemesis   Sharon King is a 34 y.o. female with past medical history significant for GERD, anxiety, asthma, seizures, chronic migraine who presents for evaluation of hematemesis.  Was seen by urgent care approximately 2 months ago.  Was prescribed Protonix.  Was seen there for epigastric pain which is likely thought due to an ulcer.  Was taking NSAIDs at that time.  Patient states she is has stopped taking NSAIDs over the last month.  Was seen by her PCP who also put her on Carafate and additional Protonix.  She has not followed with GI however was told she needed to.  States she has been compliant with her medications.  Approximately 10 AM this morning patient with 1 episode of "large-volume" bright red blood.  No clots.  Has had consistent epigastric pain over the last 2 months.  Also has chronic cough which she has been told was due to her reflux.  Patient states she has been working long hours which is why she cannot follow-up with GI.  Denies any melanotic or bright red blood in her stools.  Denies additional aggravating or alleviating factors.  No dizziness however feels intermittently lightheaded.  States she also has chronic migraines and she had one yesterday.  Denies sudden onset thunderclap headache.  Denies prior hx of esophageal varices or chronic alcohol use.  History of pain from patient and past medical records.  No interpreter is used.  PCP- Carles Collet PA-C   HPI     Past Medical History:  Diagnosis Date  . Allergy   . Anxiety   . Asthma   . Cancer ([place])   . GERD (gastroesophageal reflux disease)   . History of preterm delivery   . Migraines   . Neuromuscular disorder ([place])   . Seizures ([place])    Last seizure 2014  . Sleep apnea   . Trigeminal neuralgia     Patient Active  Problem List   Diagnosis Date Noted  . Acute upper GI bleed 08/02/2019  . Chest pain due to GERD 08/02/2019  . Chronic migraine 06/23/2019  . Left shoulder pain 03/04/2018  . Muscle spasm 02/18/2016  . ALLERGIC RHINITIS 04/22/2007  . Asthma 11/06/2006  . Migraines 11/06/2006    Past Surgical History:  Procedure Laterality Date  . BREAST ENHANCEMENT SURGERY  2008  . BREAST SURGERY    . CESAREAN SECTION N/A 06/13/2015   Procedure: CESAREAN SECTION;  Surgeon: Lavonia Drafts, MD;  Location: [facility];  Service: Obstetrics;  Laterality: N/A;  . COSMETIC SURGERY    . wisdom teeth       OB History    Gravida  1   Para  1   Term      Preterm  1   AB      Living  1     SAB      TAB      Ectopic      Multiple  0   Live Births  1           Family History  Problem Relation Age of Onset  . Hyperlipidemia Mother   . Arthritis Mother   . Depression Mother   . Arthritis Father   . Depression Father   . Alcohol abuse Father     Social History   Tobacco Use  .  Smoking status: Never Smoker  . Smokeless tobacco: Former Network engineer Use Topics  . Alcohol use: No    Comment: rare-stopped with pregnancy  . Drug use: No    Home Medications Prior to Admission medications   Medication Sig Start Date End Date Taking? Authorizing Provider  norgestimate-ethinyl estradiol (ORTHO-CYCLEN) 0.25-35 MG-MCG tablet Take 1 tablet by mouth daily. Take active pills in a continuous fashion. Have a withdrawal bleed once every 3-6 months Patient taking differently: Take 1 tablet by mouth at bedtime. Take active pills in a continuous fashion. Have a withdrawal bleed once every 3-6 months 03/13/19  Yes Weinhold, Samantha C, CNM  pantoprazole (PROTONIX) 40 MG tablet Take 1 tablet (40 mg total) by mouth daily. 06/16/19 09/14/19 Yes Trinna Post, PA-C  promethazine (PHENERGAN) 12.5 MG tablet Take 1 tablet (12.5 mg total) by mouth every 6 (six) hours as needed for nausea or  vomiting. 06/23/19  Yes Teague Carlis Abbott, Collene Leyden, PA-C  REYVOW 100 MG TABS Take 1 tablet by mouth once. May repeat in 2 hours 07/05/19  Yes [provider]  sucralfate (CARAFATE) 1 g tablet Take 1 tablet (1 g total) by mouth 4 (four) times daily -  with meals and at bedtime. 06/16/19 09/14/19 Yes Trinna Post, PA-C  SUMAtriptan (IMITREX) 100 MG tablet Take 1 tablet (100 mg total) by mouth once as needed for up to 1 dose for migraine. May repeat in 2 hours if headache persists or recurs. 12/16/18  Yes Teague Bobbye Morton, PA-C  tiZANidine (ZANAFLEX) 4 MG tablet TAKE TWO TABLETS BY MOUTH AT BEDTIME Patient taking differently: Take 4 mg by mouth at bedtime as needed for muscle spasms.  06/13/19  Yes Trinna Post, PA-C  topiramate (TOPAMAX) 25 MG tablet Take 3 tablets (75 mg total) by mouth daily. Patient taking differently: Take 75 mg by mouth at bedtime.  06/23/19  Yes Teague Bobbye Morton, PA-C  amitriptyline (ELAVIL) 50 MG tablet TAKE 1 TABLET BY MOUTH EVERYDAY AT BEDTIME Patient not taking: Reported on 08/02/2019 12/16/18   Jaclyn Prime, Collene Leyden, PA-C    Allergies    Erythromycin, Paroxetine, and Sulfonamide derivatives  Review of Systems   Review of Systems  Constitutional: Negative.   HENT: Negative.   Respiratory: Negative.   Cardiovascular: Negative.   Gastrointestinal: Positive for abdominal pain, nausea and vomiting. Negative for abdominal distention, anal bleeding, blood in stool, constipation and diarrhea.       Hematemasis  Genitourinary: Negative.   Musculoskeletal: Negative.   Skin: Negative.   Neurological: Positive for light-headedness. Negative for dizziness, tremors, seizures, syncope, facial asymmetry, speech difficulty, weakness, numbness and headaches.  All other systems reviewed and are negative.   Physical Exam Updated Vital Signs BP (!) 155/105 (BP Location: Right Arm)   Pulse 89   Temp 99.6 F (37.6 C) (Oral)   Resp 20   SpO2 98%   Physical  Exam Vitals and nursing note reviewed. Exam conducted with a chaperone present.  Constitutional:      General: She is not in acute distress.    Appearance: She is well-developed. She is not ill-appearing, toxic-appearing or diaphoretic.  HENT:     Head: Normocephalic and atraumatic.     Nose: Nose normal.     Mouth/Throat:     Mouth: Mucous membranes are moist.     Pharynx: Oropharynx is clear.  Eyes:     Pupils: Pupils are equal, round, and reactive to light.  Cardiovascular:  Rate and Rhythm: Normal rate.     Pulses: Normal pulses.     Heart sounds: Normal heart sounds.  Pulmonary:     Effort: Pulmonary effort is normal. No respiratory distress.     Breath sounds: Normal breath sounds.  Abdominal:     General: Bowel sounds are normal. There is no distension.     Tenderness: There is abdominal tenderness in the epigastric area. There is no right CVA tenderness, left CVA tenderness, guarding or rebound. Negative signs include Murphy's sign and McBurney's sign.     Hernia: No hernia is present.    Genitourinary:    Rectum: Normal.     Comments: No stool in rectal vault. No BRB or melena. Musculoskeletal:        General: Normal range of motion.     Cervical back: Normal range of motion.  Skin:    General: Skin is warm and dry.  Neurological:     Mental Status: She is alert.    ED Results / Procedures / Treatments   Labs (all labs ordered are listed, but only abnormal results are displayed) Labs Reviewed  COMPREHENSIVE METABOLIC PANEL - Abnormal; Notable for the following components:      Result Value   Glucose, Bld 113 (*)    Albumin 3.4 (*)    All other components within normal limits  CBC - Abnormal; Notable for the following components:   Platelets 453 (*)    All other components within normal limits  SARS CORONAVIRUS 2 (TAT 6-24 HRS)  LIPASE, BLOOD  CBC  HIV ANTIBODY (ROUTINE TESTING W REFLEX)  BASIC METABOLIC PANEL  POC OCCULT BLOOD, ED  I-STAT BETA HCG  BLOOD, ED ([facility], [facility], [facility] ONLY)  TYPE AND SCREEN  ABO/RH    EKG None  Radiology CT ABDOMEN PELVIS W CONTRAST  Result Date: 08/02/2019 CLINICAL DATA:  Bright red blood emesis this morning. History of gastric ulcer. EXAM: CT ABDOMEN AND PELVIS WITH CONTRAST TECHNIQUE: Multidetector CT imaging of the abdomen and pelvis was performed using the standard protocol following bolus administration of intravenous contrast. CONTRAST:  125mL OMNIPAQUE IOHEXOL 300 MG/ML  SOLN COMPARISON:  None. FINDINGS: Lower chest: Clear lung bases. No significant pleural or pericardial effusion. Hepatobiliary: The liver is normal in density without suspicious focal abnormality. Multiple small calcified gallstones. No evidence of gallbladder wall thickening, biliary dilatation or surrounding inflammatory changes. Pancreas: Unremarkable. No pancreatic ductal dilatation or surrounding inflammatory changes. Spleen: Normal in size without focal abnormality. Adrenals/Urinary Tract: Both adrenal glands appear normal. The kidneys appear normal without evidence of urinary tract calculus, suspicious lesion or hydronephrosis. No bladder abnormalities are seen. Stomach/Bowel: No evidence of bowel wall thickening, distention or surrounding inflammatory change. No gastric or duodenal ulcers identified. No evidence of active GI bleeding. The appendix appears normal. Vascular/Lymphatic: There are no enlarged abdominal or pelvic lymph nodes. No significant vascular findings. Reproductive: The uterus and ovaries appear normal. No adnexal mass. Other: No free air, ascites or focal extraluminal fluid collection. Bilateral breast implants are noted. Musculoskeletal: No acute or significant osseous findings. IMPRESSION: 1. No acute findings or explanation for the patient's symptoms. No visible gastric or duodenal ulcers. 2. Cholelithiasis. Electronically Signed   By: Richardean Sale M.D.   On: 08/02/2019 15:22    Procedures Procedures (including critical  care time)  Medications Ordered in ED Medications  pantoprazole (PROTONIX) 80 mg in sodium chloride 0.9 % 100 mL (0.8 mg/mL) infusion (has no administration in time range)  pantoprazole (PROTONIX) injection  40 mg (has no administration in time range)  HYDROmorphone (DILAUDID) injection 0.5 mg (has no administration in time range)  sodium chloride 0.9 % bolus 1,000 mL (0 mLs Intravenous Stopped 08/02/19 1532)  ondansetron (ZOFRAN) injection 4 mg (4 mg Intravenous Given 08/02/19 1341)  morphine 4 MG/ML injection 4 mg (4 mg Intravenous Given 08/02/19 1342)  pantoprazole (PROTONIX) injection 40 mg (40 mg Intravenous Given 08/02/19 1346)  alum & mag hydroxide-simeth (MAALOX/MYLANTA) 200-200-20 MG/5ML suspension 30 mL (30 mLs Oral Given 08/02/19 1340)    And  lidocaine (XYLOCAINE) 2 % viscous mouth solution 15 mL (15 mLs Oral Given 08/02/19 1340)  iohexol (OMNIPAQUE) 300 MG/ML solution 100 mL (100 mLs Intravenous Contrast Given 08/02/19 1443)  morphine 4 MG/ML injection 4 mg (4 mg Intravenous Given 08/02/19 1529)  HYDROmorphone (DILAUDID) injection 1 mg (1 mg Intravenous Given 08/02/19 1651)  ondansetron (ZOFRAN) injection 4 mg (4 mg Intravenous Given 08/02/19 1650)    ED Course  I have reviewed the triage vital signs and the nursing notes.  Pertinent labs & imaging results that were available during my care of the patient were reviewed by me and considered in my medical decision making (see chart for details).  34 year old female appears otherwise well presents for evaluation of hematemesis.  She is afebrile, nonseptic, not ill-appearing.  1 episode of hematemesis earlier today.  Denies any melanotic stools.  Has been treated by PCP outpatient for possible gastric ulcer x2 months.  Admits to compliance with Carafate and Protonix.  Has chronic cough times months which was told was likely due to GERD.  No recent Covid exposures.  Patient appears hemodynamically stable.  She has no tachycardia, tachypnea or  hypoxia.  Has had some mild lightheadedness however has negative orthostatic vital signs.  Labs obtained from triage which were personally reviewed.  Plan on occult, imaging, symptomatic management and reassess.  Labs and imaging personally reviewed and interpreted: Pregnancy test negative CBC without leukocytosis, hemoglobin [id number] Metabolic panel with mild hyperglycemia 113 however no elevation in her BUN or other additional electrolyte, renal or liver abnormality Occult negative Lipase 30 CT AP without acute findings.  1430: Patient reassessed. Requesting additional pain meds. Will order. Pending CT  1540: Patient reassessed. Discussed reassuring CT AP. Discussed concern for bleeding ulcer given hematemesis and epigastric pain. Patient unsure if she wants admission at this time. Will consult with GI for treatment recommendations.  1630: CONSULT with Azucena Freed with [facility].  Recommends n.p.o. however can have ice chips.  They will plan to either see her tonight or tomorrow morning.  If patient becomes hemodynamically unstable hospitalist may reconsult the on-call physician.  1700: Discussed plan with admission with patient and family in room.  They are agreeable with plan for admission.  CONSULT with Dr. Avon Gully with [facility] who will evaluate for admission.  The patient appears reasonably stabilized for admission considering the current resources, flow, and capabilities available in the ED at this time, and I doubt any other [facility] requiring further screening and/or treatment in the ED prior to admission.    MDM Rules/Calculators/A&P                       Final Clinical Impression(s) / ED Diagnoses Final diagnoses:  Upper GI bleed  Epigastric pain    Rx / DC Orders ED Discharge Orders    None       Caitlynne Harbeck A, PA-C 08/02/19 1757    Virgel Manifold, MD 08/04/19  1011  

## 2019-08-02 NOTE — H&P (Signed)
History and Physical    Sharon King [id number] DOB: 1986-01-20 DOA: 08/02/2019  PCP: Trinna Post, PA-C   Chief Complaint: Hematemesis  HPI: Sharon King is a 34 y.o. female with medical history significant of GERD, anxiety, asthma, seizures, as well as chronic migraine who presents for evaluation of sudden onset painless hematemesis x1 this morning.  Patient was seen approximately 2 months ago for left upper quadrant abdominal pain and burning sensation diagnosed with GERD, she does have a remarkable history of NSAID use given her migraine history but denies any recent NSAID use per our discussion.  Patient was prescribed Protonix as well as what appears to be Carafate over the past few weeks.  Early this morning she notes a single episode of bright red blood with emesis and worsening epigastric and left upper quadrant abdominal pain that appears to be sharp and burning in nature.  She denies any change in stool, denies hematochezia, melena, diarrhea, headaches, fevers, chills, shortness of breath, chest pain, dizziness, syncope, presyncope, vision changes.  ED Course: In the emergency department patient was given GI cocktail, Dilaudid, Zofran as well as IV PPI and 1 L normal saline with adequate pain control.  Hospitalist was called and GI was consulted for further evaluation of epigastric pain and bleeding with likely endoscopy in the next 12 to 24 hours pending clinical course.  Review of Systems: As per HPI above.  Assessment/Plan Principal Problem:   Acute upper GI bleed Active Problems:   Chronic migraine   Asthma   Chest pain due to GERD    Acute intractable abdominal pain with concurrent symptomatic upper GI bleed in the setting of uncontrolled GERD and NSAID use -Patient's hemoglobin appears to be stable and near baseline -GI following, appreciate insight recommendations, ice chips today n.p.o. at midnight per discussion with ED -Pantoprazole continuous  IV drip overnight -Avoid NSAIDs, hold chemical DVT prophylaxis in the setting of likely active bleed -Continue to follow CBC with a.m. labs, tentatively planned upper endoscopy with GI tomorrow -Pain well controlled with morphine and Dilaudid in the ED, will continue low-dose Dilaudid overnight, titrate as necessary  Migraine, chronic -We discussed limiting NSAID use and continuing her home regimen of Triptan/Topamax/amitriptyline/tizanidine only -If migraines cannot be controlled on the above cocktail and needs additional NSAIDs for pain control she would likely benefit from specialist evaluation in the outpatient setting  Anxiety -Continue home medications once verified  Asthma -Appears well controlled, no inhalers on home med rec, patient declines any recent exacerbation events   DVT prophylaxis: SCDs only given active bleeding as above Code Status: Full Family Communication: None present Disposition Plan: Inpatient, patient continues to require close monitoring in the setting of active GI bleed with IV Protonix, IV fluids,, need for close monitoring, possible upper endoscopy or repeat imaging per GI, ultimate disposition likely discharge back home pending further finding and evaluation Consults called: GI Admission status: Inpatient, continues to require additional work-up and evaluation as above   Past Medical History:  Diagnosis Date  . Allergy   . Anxiety   . Asthma   . Cancer ([place])   . GERD (gastroesophageal reflux disease)   . History of preterm delivery   . Migraines   . Neuromuscular disorder ([place])   . Seizures ([place])    Last seizure 2014  . Sleep apnea   . Trigeminal neuralgia     Past Surgical History:  Procedure Laterality Date  . BREAST ENHANCEMENT SURGERY  2008  . BREAST SURGERY    .  CESAREAN SECTION N/A 06/13/2015   Procedure: CESAREAN SECTION;  Surgeon: Lavonia Drafts, MD;  Location: [facility];  Service: Obstetrics;  Laterality: N/A;  . COSMETIC  SURGERY    . wisdom teeth       reports that she has never smoked. She has quit using smokeless tobacco. She reports that she does not drink alcohol or use drugs.  Allergies  Allergen Reactions  . Erythromycin Rash  . Paroxetine Palpitations  . Sulfonamide Derivatives Rash    Family History  Problem Relation Age of Onset  . Hyperlipidemia Mother   . Arthritis Mother   . Depression Mother   . Arthritis Father   . Depression Father   . Alcohol abuse Father     Prior to Admission medications   Medication Sig Start Date End Date Taking? Authorizing Provider  norgestimate-ethinyl estradiol (ORTHO-CYCLEN) 0.25-35 MG-MCG tablet Take 1 tablet by mouth daily. Take active pills in a continuous fashion. Have a withdrawal bleed once every 3-6 months Patient taking differently: Take 1 tablet by mouth at bedtime. Take active pills in a continuous fashion. Have a withdrawal bleed once every 3-6 months 03/13/19  Yes Weinhold, Samantha C, CNM  pantoprazole (PROTONIX) 40 MG tablet Take 1 tablet (40 mg total) by mouth daily. 06/16/19 09/14/19 Yes Trinna Post, PA-C  promethazine (PHENERGAN) 12.5 MG tablet Take 1 tablet (12.5 mg total) by mouth every 6 (six) hours as needed for nausea or vomiting. 06/23/19  Yes Teague Carlis Abbott, Collene Leyden, PA-C  REYVOW 100 MG TABS Take 1 tablet by mouth once. May repeat in 2 hours 07/05/19  Yes [provider]  sucralfate (CARAFATE) 1 g tablet Take 1 tablet (1 g total) by mouth 4 (four) times daily -  with meals and at bedtime. 06/16/19 09/14/19 Yes Trinna Post, PA-C  SUMAtriptan (IMITREX) 100 MG tablet Take 1 tablet (100 mg total) by mouth once as needed for up to 1 dose for migraine. May repeat in 2 hours if headache persists or recurs. 12/16/18  Yes Teague Bobbye Morton, PA-C  tiZANidine (ZANAFLEX) 4 MG tablet TAKE TWO TABLETS BY MOUTH AT BEDTIME Patient taking differently: Take 4 mg by mouth at bedtime as needed for muscle spasms.  06/13/19  Yes Trinna Post,  PA-C  topiramate (TOPAMAX) 25 MG tablet Take 3 tablets (75 mg total) by mouth daily. Patient taking differently: Take 75 mg by mouth at bedtime.  06/23/19  Yes Teague Bobbye Morton, PA-C  amitriptyline (ELAVIL) 50 MG tablet TAKE 1 TABLET BY MOUTH EVERYDAY AT BEDTIME Patient not taking: Reported on 08/02/2019 12/16/18   Paticia Stack, PA-C    Physical Exam: Vitals:   08/02/19 1524 08/02/19 1545 08/02/19 1630 08/02/19 1707  BP:  128/90 (!) 142/88 (!) 155/105  Pulse: 86 98 78 89  Resp:  16 16 20   Temp:      TempSrc:      SpO2: 100% 97% 98% 98%    Constitutional: NAD, calm, comfortable Vitals:   08/02/19 1524 08/02/19 1545 08/02/19 1630 08/02/19 1707  BP:  128/90 (!) 142/88 (!) 155/105  Pulse: 86 98 78 89  Resp:  16 16 20   Temp:      TempSrc:      SpO2: 100% 97% 98% 98%   Eyes: PERRL, lids and conjunctivae normal ENMT: Mucous membranes are moist. Posterior pharynx clear of any exudate or lesions.Normal dentition.  Neck: normal, supple, no masses, no thyromegaly Respiratory: clear to auscultation bilaterally, no wheezing, no crackles.  Normal respiratory effort. No accessory muscle use.  Cardiovascular: Regular rate and rhythm, no murmurs / rubs / gallops. No extremity edema. 2+ pedal pulses. No carotid bruits.  Abdomen: no tenderness, no masses palpated. No hepatosplenomegaly. Bowel sounds positive.  Musculoskeletal: no clubbing / cyanosis. No joint deformity upper and lower extremities. Good ROM, no contractures. Normal muscle tone.  Skin: no rashes, lesions, ulcers. No induration Neurologic: CN 2-12 grossly intact. Sensation intact, DTR normal. Strength 5/5 in all 4.  Psychiatric: Normal judgment and insight. Alert and oriented x 3. Normal mood.   Labs on Admission: I have personally reviewed following labs and imaging studies  CBC: Recent Labs  Lab 08/02/19 1106  WBC 8.5  HGB 13.6  HCT 43.0  MCV 91.1  PLT 0000000*   Basic Metabolic Panel: Recent Labs  Lab  08/02/19 1106  NA 139  K 3.7  CL 106  CO2 24  GLUCOSE 113*  BUN 7  CREATININE 0.71  CALCIUM 8.9   GFR: CrCl cannot be calculated (Unknown ideal weight.). Liver Function Tests: Recent Labs  Lab 08/02/19 1106  AST 17  ALT 15  ALKPHOS 81  BILITOT 0.4  PROT 7.3  ALBUMIN 3.4*   Recent Labs  Lab 08/02/19 1314  LIPASE 30   No results for input(s): AMMONIA in the last 168 hours. Coagulation Profile: No results for input(s): INR, PROTIME in the last 168 hours. Cardiac Enzymes: No results for input(s): CKTOTAL, CKMB, CKMBINDEX, TROPONINI in the last 168 hours. BNP (last 3 results) No results for input(s): PROBNP in the last 8760 hours. HbA1C: No results for input(s): HGBA1C in the last 72 hours. CBG: No results for input(s): GLUCAP in the last 168 hours. Lipid Profile: No results for input(s): CHOL, HDL, LDLCALC, TRIG, CHOLHDL, LDLDIRECT in the last 72 hours. Thyroid Function Tests: No results for input(s): TSH, T4TOTAL, FREET4, T3FREE, THYROIDAB in the last 72 hours. Anemia Panel: No results for input(s): VITAMINB12, FOLATE, FERRITIN, TIBC, IRON, RETICCTPCT in the last 72 hours. Urine analysis:    Component Value Date/Time   COLORURINE STRAW (A) 06/04/2017 1206   APPEARANCEUR CLEAR 06/04/2017 1206   APPEARANCEUR Clear 10/24/2013 1651   LABSPEC 1.003 (L) 06/04/2017 1206   LABSPEC 1.014 10/24/2013 1651   PHURINE 6.0 06/04/2017 1206   GLUCOSEU NEGATIVE 06/04/2017 1206   GLUCOSEU Negative 10/24/2013 1651   HGBUR MODERATE (A) 06/04/2017 1206   BILIRUBINUR NEGATIVE 06/04/2017 1206   BILIRUBINUR Negative 10/24/2013 1651   KETONESUR NEGATIVE 06/04/2017 1206   PROTEINUR NEGATIVE 06/04/2017 1206   UROBILINOGEN 0.2 04/03/2012 1817   NITRITE NEGATIVE 06/04/2017 1206   LEUKOCYTESUR NEGATIVE 06/04/2017 1206   LEUKOCYTESUR Negative 10/24/2013 1651    Radiological Exams on Admission: CT ABDOMEN PELVIS W CONTRAST  Result Date: 08/02/2019 CLINICAL DATA:  Bright red blood  emesis this morning. History of gastric ulcer. EXAM: CT ABDOMEN AND PELVIS WITH CONTRAST TECHNIQUE: Multidetector CT imaging of the abdomen and pelvis was performed using the standard protocol following bolus administration of intravenous contrast. CONTRAST:  129mL OMNIPAQUE IOHEXOL 300 MG/ML  SOLN COMPARISON:  None. FINDINGS: Lower chest: Clear lung bases. No significant pleural or pericardial effusion. Hepatobiliary: The liver is normal in density without suspicious focal abnormality. Multiple small calcified gallstones. No evidence of gallbladder wall thickening, biliary dilatation or surrounding inflammatory changes. Pancreas: Unremarkable. No pancreatic ductal dilatation or surrounding inflammatory changes. Spleen: Normal in size without focal abnormality. Adrenals/Urinary Tract: Both adrenal glands appear normal. The kidneys appear normal without evidence of urinary tract calculus, suspicious  lesion or hydronephrosis. No bladder abnormalities are seen. Stomach/Bowel: No evidence of bowel wall thickening, distention or surrounding inflammatory change. No gastric or duodenal ulcers identified. No evidence of active GI bleeding. The appendix appears normal. Vascular/Lymphatic: There are no enlarged abdominal or pelvic lymph nodes. No significant vascular findings. Reproductive: The uterus and ovaries appear normal. No adnexal mass. Other: No free air, ascites or focal extraluminal fluid collection. Bilateral breast implants are noted. Musculoskeletal: No acute or significant osseous findings. IMPRESSION: 1. No acute findings or explanation for the patient's symptoms. No visible gastric or duodenal ulcers. 2. Cholelithiasis. Electronically Signed   By: Richardean Sale M.D.   On: 08/02/2019 15:22    [facility]  If 7PM-7AM, please contact night-coverage [email or web address]  08/02/2019, 5:48 PM

## 2019-08-02 NOTE — ED Notes (Signed)
Pt transported to CT ?

## 2019-08-02 NOTE — Consult Note (Addendum)
[facility] Consult: 4:35 PM 08/02/2019  LOS: 0 days    Referring Provider: Dr Wilson Singer in ED  Primary Care Physician:  Trinna Post, PA-C. Primary Gastroenterologist:  unassigned     Reason for Consultation:  Hematemesis, abd pain   HPI: Sharon King is a 34 y.o. female.  PMH obesity.  OSA.  C-section.  When she was 13 she had "stomach problems and heavy periods but did not undergo endoscopic evaluation.  About a month ago patient developed left upper quadrant pain.  She went to urgent care and was started on Protonix 40/day and Carafate 4 times daily.  The pain has persisted.  She does not take NSAIDs.  No ETOH or tobacco.  She has been taking Tylenol for the pain with little benefit. Yesterday morning she had a migraine and had nonbloody emesis, not unusual for her. This morning she was nauseated without migraine and vomited pure blood, felt dizzy but this subsided.  No bowel movements today.  Yesterday's bowel movement was brown.   Denies excessive bleeding of any other sort.  She is on continuous birth control so has random spotting.  Came to the ED this AM, heart rate is 101, blood pressure is hypertensive. Hgb 13.6.  WBCs, platelets, MCV normal.  Lipase, LFTs normal BUN creatinine normal. Contrasted CTAP is unremarkable other than uncomplicated cholelithiasis.  Pain improved for a few hours after Dilaudid, morphine but returns.  Received 40 mg IV Protonix.  Family history pertinent for GERD in her mother.    Past Medical History:  Diagnosis Date   Allergy    Anxiety    Asthma    Cancer ([place])    GERD (gastroesophageal reflux disease)    History of preterm delivery    Migraines    Neuromuscular disorder ([place])    Seizures ([place])    Last seizure 2014   Sleep apnea    Trigeminal  neuralgia     Past Surgical History:  Procedure Laterality Date   BREAST ENHANCEMENT SURGERY  2008   BREAST SURGERY     CESAREAN SECTION N/A 06/13/2015   Procedure: CESAREAN SECTION;  Surgeon: Lavonia Drafts, MD;  Location: [facility];  Service: Obstetrics;  Laterality: N/A;   COSMETIC SURGERY     wisdom teeth      Prior to Admission medications   Medication Sig Start Date End Date Taking? Authorizing Provider  norgestimate-ethinyl estradiol (ORTHO-CYCLEN) 0.25-35 MG-MCG tablet Take 1 tablet by mouth daily. Take active pills in a continuous fashion. Have a withdrawal bleed once every 3-6 months Patient taking differently: Take 1 tablet by mouth at bedtime. Take active pills in a continuous fashion. Have a withdrawal bleed once every 3-6 months 03/13/19  Yes Weinhold, Samantha C, CNM  pantoprazole (PROTONIX) 40 MG tablet Take 1 tablet (40 mg total) by mouth daily. 06/16/19 09/14/19 Yes Trinna Post, PA-C  promethazine (PHENERGAN) 12.5 MG tablet Take 1 tablet (12.5 mg total) by mouth every 6 (six) hours as needed for nausea or vomiting. 06/23/19  Yes Paticia Stack, PA-C  REYVOW 100 MG TABS Take 1 tablet by mouth once. May repeat in 2 hours 07/05/19  Yes [provider]  sucralfate (CARAFATE) 1 g tablet Take 1 tablet (1 g total) by mouth 4 (four) times daily -  with meals and at bedtime. 06/16/19 09/14/19 Yes Trinna Post, PA-C  SUMAtriptan (IMITREX) 100 MG tablet Take 1 tablet (100 mg total) by mouth once as needed for up to 1 dose for migraine. May repeat in 2 hours if headache persists or recurs. 12/16/18  Yes Teague Bobbye Morton, PA-C  tiZANidine (ZANAFLEX) 4 MG tablet TAKE TWO TABLETS BY MOUTH AT BEDTIME Patient taking differently: Take 4 mg by mouth at bedtime as needed for muscle spasms.  06/13/19  Yes Trinna Post, PA-C  topiramate (TOPAMAX) 25 MG tablet Take 3 tablets (75 mg total) by mouth daily. Patient taking differently: Take 75 mg by mouth at bedtime.   06/23/19  Yes Teague Bobbye Morton, PA-C  amitriptyline (ELAVIL) 50 MG tablet TAKE 1 TABLET BY MOUTH EVERYDAY AT BEDTIME Patient not taking: Reported on 08/02/2019 12/16/18   Jaclyn Prime, Collene Leyden, PA-C    Scheduled Meds:  Infusions:  PRN Meds:    Allergies as of 08/02/2019 - Review Complete 08/02/2019  Allergen Reaction Noted   Erythromycin Rash    Paroxetine Palpitations    Sulfonamide derivatives Rash     Family History  Problem Relation Age of Onset   Hyperlipidemia Mother    Arthritis Mother    Depression Mother    Arthritis Father    Depression Father    Alcohol abuse Father     Social History   Socioeconomic History   Marital status: Legally Separated    Spouse name: Not on file   Number of children: Not on file   Years of education: Not on file   Highest education level: Not on file  Occupational History   Not on file  Tobacco Use   Smoking status: Never Smoker   Smokeless tobacco: Former Systems developer  Substance and Sexual Activity   Alcohol use: No    Comment: rare-stopped with pregnancy   Drug use: No   Sexual activity: Not Currently    Birth control/protection: Pill  Other Topics Concern   Not on file  Social History Narrative   Not on file   Social Determinants of Health   Financial Resource Strain:    Difficulty of Paying Living Expenses:   Food Insecurity:    Worried About Charity fundraiser in the Last Year:    Arboriculturist in the Last Year:   Transportation Needs:    Film/video editor (Medical):    Lack of Transportation (Non-Medical):   Physical Activity:    Days of Exercise per Week:    Minutes of Exercise per Session:   Stress:    Feeling of Stress :   Social Connections:    Frequency of Communication with Friends and Family:    Frequency of Social Gatherings with Friends and Family:    Attends Religious Services:    Active Member of Clubs or Organizations:    Attends Music therapist:    Marital Status:     Intimate Partner Violence:    Fear of Current or Ex-Partner:    Emotionally Abused:    Physically Abused:    Sexually Abused:     REVIEW OF SYSTEMS: Constitutional: Felt weak this morning.  Feels okay today. ENT:  No nose bleeds Pulm: No  shortness of breath.  No cough. CV:  No palpitations, no LE edema.  No angina. GU:  No hematuria, no frequency GI: See HPI. Heme: Denies unusual or excessive bleeding or bruising. Transfusions: None. Neuro:  No peripheral tingling or numbness.  Dizziness this morning subsided within a few minutes. Derm:  No itching, no rash or sores.  Endocrine:  No sweats or chills.  No polyuria or dysuria Immunization: Not queried. Travel:  None beyond local counties in last few months.    PHYSICAL EXAM: Vital signs in last 24 hours: Vitals:   08/02/19 1524 08/02/19 1545  BP:  128/90  Pulse: 86 98  Resp:  16  Temp:    SpO2: 100% 97%   Wt Readings from Last 3 Encounters:  06/23/19 82.6 kg  06/16/19 82.9 kg  12/16/18 74.8 kg    General: Patient looks well.  She is a little bit anxious.  Overweight. Head: No facial asymmetry or swelling.  No signs of head trauma. Eyes: No scleral icterus.  No conjunctival pallor.  EOMI Ears: Not hard of hearing Nose: No congestion or discharge Mouth: Moist, clear, pink oral mucosa.  Tongue midline. Neck: No JVD, no masses, no thyromegaly. Lungs: Clear bilaterally.  No labored breathing, no cough. Heart: RRR.  No MRG.  S1, S2 present. Abdomen: Soft.  Minor tenderness in the left upper abdomen without guarding or rebound.  No HSM, masses, bruits, hernias.  Bowel sounds active..   Rectal: Deferred.  There was no stool in the vault for Hemoccult testing per the ED physician assistant's exam. Musc/Skeltl: No joint redness, swelling or gross deformity. Extremities: No CCE. Neurologic: Oriented x3.  Alert.  Moves all 4 limbs, no gross deficits. Skin: No rash, no sores, no suspicious lesions. Nodes: No cervical  adenopathy Psych: Cooperative, fluid speech, slightly anxious.  Intake/Output from previous day: No intake/output data recorded. Intake/Output this shift: Total I/O In: 1000 [IV Piggyback:1000] Out: -   LAB RESULTS: Recent Labs    08/02/19 1106  WBC 8.5  HGB 13.6  HCT 43.0  PLT 453*   BMET Lab Results  Component Value Date   NA 139 08/02/2019   NA 139 06/16/2019   NA 140 10/24/2013   K 3.7 08/02/2019   K 4.2 06/16/2019   K 3.5 10/24/2013   CL 106 08/02/2019   CL 101 06/16/2019   CL 109 (H) 10/24/2013   CO2 24 08/02/2019   CO2 21 06/16/2019   CO2 24 10/24/2013   GLUCOSE 113 (H) 08/02/2019   GLUCOSE 95 06/16/2019   GLUCOSE 106 (H) 10/24/2013   BUN 7 08/02/2019   BUN 9 06/16/2019   BUN 9 10/24/2013   CREATININE 0.71 08/02/2019   CREATININE 0.69 06/16/2019   CREATININE 0.63 10/24/2013   CALCIUM 8.9 08/02/2019   CALCIUM 9.1 06/16/2019   CALCIUM 8.8 10/24/2013   LFT Recent Labs    08/02/19 1106  PROT 7.3  ALBUMIN 3.4*  AST 17  ALT 15  ALKPHOS 81  BILITOT 0.4   PT/INR No results found for: INR, PROTIME Hepatitis Panel No results for input(s): HEPBSAG, HCVAB, HEPAIGM, HEPBIGM in the last 72 hours. C-Diff No components found for: CDIFF Lipase     Component Value Date/Time   LIPASE 30 08/02/2019 1314   LIPASE 162 10/24/2013 1650    Drugs of Abuse     Component Value Date/Time   LABOPIA NEG 12/13/2014 1038   LABOPIA NEGATIVE 11/23/2012 0842   COCAINSCRNUR NEG 12/13/2014 1038   LABBENZ NEG  12/13/2014 1038   LABBENZ NEGATIVE 11/23/2012 0842   AMPHETMU NEG 12/13/2014 1038   THCU NEG 12/13/2014 [place] 11/23/2012 0842   LABBARB NEG 12/13/2014 1038   LABBARB NEGATIVE 11/23/2012 0842     RADIOLOGY STUDIES: CT ABDOMEN PELVIS W CONTRAST  Result Date: 08/02/2019 CLINICAL DATA:  Bright red blood emesis this morning. History of gastric ulcer. EXAM: CT ABDOMEN AND PELVIS WITH CONTRAST TECHNIQUE: Multidetector CT imaging of the abdomen and  pelvis was performed using the standard protocol following bolus administration of intravenous contrast. CONTRAST:  144mL OMNIPAQUE IOHEXOL 300 MG/ML  SOLN COMPARISON:  None. FINDINGS: Lower chest: Clear lung bases. No significant pleural or pericardial effusion. Hepatobiliary: The liver is normal in density without suspicious focal abnormality. Multiple small calcified gallstones. No evidence of gallbladder wall thickening, biliary dilatation or surrounding inflammatory changes. Pancreas: Unremarkable. No pancreatic ductal dilatation or surrounding inflammatory changes. Spleen: Normal in size without focal abnormality. Adrenals/Urinary Tract: Both adrenal glands appear normal. The kidneys appear normal without evidence of urinary tract calculus, suspicious lesion or hydronephrosis. No bladder abnormalities are seen. Stomach/Bowel: No evidence of bowel wall thickening, distention or surrounding inflammatory change. No gastric or duodenal ulcers identified. No evidence of active GI bleeding. The appendix appears normal. Vascular/Lymphatic: There are no enlarged abdominal or pelvic lymph nodes. No significant vascular findings. Reproductive: The uterus and ovaries appear normal. No adnexal mass. Other: No free air, ascites or focal extraluminal fluid collection. Bilateral breast implants are noted. Musculoskeletal: No acute or significant osseous findings. IMPRESSION: 1. No acute findings or explanation for the patient's symptoms. No visible gastric or duodenal ulcers. 2. Cholelithiasis. Electronically Signed   By: Richardean Sale M.D.   On: 08/02/2019 15:22     IMPRESSION:   *    Single episode of hematemesis this morning. Left upper quadrant pain for several weeks not alleviated by regimen of Protonix and Carafate. No significant anemia and only uncomplicated gallstones on CT.    PLAN:     *   Arrange for upper endoscopy tomorrow.  Repeat CBC in the morning.  *   Clear liquids, ordered.  N.p.o.  after midnight.  *   Protonix 40 IV twice daily, already received a dose earlier.  *    Covid testing necessary, has been collected.Azucena Freed  08/02/2019, 4:35 PM Phone [phone]   Attending physician's note   I had reviewed the chart. I agree with the Advanced Practitioner's note, impression and recommendations.  I did not see the patient. She was scheduled to have a EGD 4/22 at 12 PM (this was earliest we could fit her in the schedule).  However, she left AMA.  Carmell Austria, MD Velora Heckler Fabienne Bruns [phone].

## 2019-08-03 ENCOUNTER — Telehealth: Payer: Self-pay

## 2019-08-03 LAB — BASIC METABOLIC PANEL
Anion gap: 10 (ref 5–15)
BUN: 7 mg/dL (ref 6–20)
CO2: 22 mmol/L (ref 22–32)
Calcium: 8.6 mg/dL — ABNORMAL LOW (ref 8.9–10.3)
Chloride: 111 mmol/L (ref 98–111)
Creatinine, Ser: 0.74 mg/dL (ref 0.44–1.00)
GFR calc Af Amer: >60 mL/min (ref >60)
GFR calc non Af Amer: >60 mL/min (ref >60)
Glucose, Bld: 96 mg/dL (ref 70–99)
Potassium: 4.5 mmol/L (ref 3.5–5.1)
Sodium: 143 mmol/L (ref 135–145)

## 2019-08-03 LAB — CBC
HCT: 38.8 % (ref 36.0–46.0)
Hemoglobin: 12.2 g/dL (ref 12.0–15.0)
MCH: 28.8 pg (ref 26.0–34.0)
MCHC: 31.4 g/dL (ref 30.0–36.0)
MCV: 91.5 fL (ref 80.0–100.0)
Platelets: 415 10*3/uL — ABNORMAL HIGH (ref 150–400)
RBC: 4.24 MIL/uL (ref 3.87–5.11)
RDW: 13.4 % (ref 11.5–15.5)
WBC: 8.2 10*3/uL (ref 4.0–10.5)
nRBC: 0 % (ref 0.0–0.2)

## 2019-08-03 LAB — HIV ANTIBODY (ROUTINE TESTING W REFLEX): HIV Screen 4th Generation wRfx: NONREACTIVE

## 2019-08-03 SURGERY — ESOPHAGOGASTRODUODENOSCOPY (EGD) WITH PROPOFOL
Anesthesia: Monitor Anesthesia Care

## 2019-08-03 NOTE — Progress Notes (Signed)
Pt stated that she spoke to the ED MD downstairs lastnight and told them she needed to have the Upper endo done by 7am or else she was leaving. RN called Endo and notified Hospitalist Dr. Avon Gully, Endo stated they had her case scheduled for 1215 in the afternoon. I told the patient and she stated that she was going to leave. Dr. Avon Gully aware and stated he was on the way up, pt hgb dropped by 1 afraid she may still be bleeding.

## 2019-08-03 NOTE — Telephone Encounter (Signed)
Work note sent through [employer] to account for absence during documented admission.

## 2019-08-03 NOTE — Telephone Encounter (Signed)
Patient was advised and states that she wasn't going to worry about the letter due to the time document wrong. Just a [place]

## 2019-08-03 NOTE — Progress Notes (Signed)
Pt signed AMA paper, explained to her what it meant if she left AMA no prescriptions etc because the MD did not clear her for discharge and she will not be able to receive anything.

## 2019-08-03 NOTE — Discharge Summary (Signed)
Physician Discharge Summary  Sharon King [id number] DOB: 29-May-1985 DOA: 08/02/2019  PCP: Trinna Post, PA-C  Admit date: 08/02/2019 Discharge date: 08/03/2019  Admitted From: Home Disposition: Home  Recommendations for Outpatient Follow-up:  1. Follow up with PCP in 1-2 weeks 2. Please obtain BMP/CBC in one week  Discharge Condition: Guarded CODE STATUS: Full   Brief/Interim Summary: Sharon King is a 34 y.o. female.  PMH obesity.  OSA.  C-section.  When she was 13 she had "stomach problems and heavy periods but did not undergo endoscopic evaluation. About a month ago patient developed left upper quadrant pain.  She went to urgent care and was started on Protonix 40/day and Carafate 4 times daily.  The pain has persisted.  She does not take NSAIDs.  No ETOH or tobacco.  She has been taking Tylenol for the pain with little benefit. Yesterday morning she had a migraine and had nonbloody emesis, not unusual for her. This morning she was nauseated without migraine and vomited pure blood, felt dizzy but this subsided.  No bowel movements today.  Yesterday's bowel movement was brown. Denies excessive bleeding of any other sort.  She is on continuous birth control so has random spotting. Came to the ED this AM, heart rate is 101, blood pressure is hypertensive. Hgb 13.6.  WBCs, platelets, MCV normal.  Lipase, LFTs normal BUN creatinine normal. Contrasted CTAP is unremarkable other than uncomplicated cholelithiasis. Pain improved for a few hours after Dilaudid, morphine but returns.  Received 40 mg IV Protonix. Family history pertinent for GERD in her mother  Patient admitted as above for acute intractable abdominal pain with bright red blood hematemesis.  Unfortunately patient left this morning AGAINST MEDICAL ADVICE as she "could not wait for endoscopy" per nursing staff.  Patient left prior to my evaluation this morning.  Discharge Diagnoses:  Principal Problem:   Acute  upper GI bleed Active Problems:   Chronic migraine   Asthma   Chest pain due to GERD  Discharge Instructions   Allergies as of 08/03/2019      Reactions   Erythromycin Rash   Paroxetine Palpitations   Sulfonamide Derivatives Rash      Medication List    ASK your doctor about these medications   amitriptyline 50 MG tablet Commonly known as: ELAVIL TAKE 1 TABLET BY MOUTH EVERYDAY AT BEDTIME   norgestimate-ethinyl estradiol 0.25-35 MG-MCG tablet Commonly known as: ORTHO-CYCLEN Take 1 tablet by mouth daily. Take active pills in a continuous fashion. Have a withdrawal bleed once every 3-6 months   pantoprazole 40 MG tablet Commonly known as: Protonix Take 1 tablet (40 mg total) by mouth daily.   promethazine 12.5 MG tablet Commonly known as: PHENERGAN Take 1 tablet (12.5 mg total) by mouth every 6 (six) hours as needed for nausea or vomiting.   Reyvow 100 MG Tabs Generic drug: Lasmiditan Succinate Take 1 tablet by mouth once. May repeat in 2 hours   sucralfate 1 g tablet Commonly known as: Carafate Take 1 tablet (1 g total) by mouth 4 (four) times daily -  with meals and at bedtime.   SUMAtriptan 100 MG tablet Commonly known as: IMITREX Take 1 tablet (100 mg total) by mouth once as needed for up to 1 dose for migraine. May repeat in 2 hours if headache persists or recurs.   tiZANidine 4 MG tablet Commonly known as: ZANAFLEX TAKE TWO TABLETS BY MOUTH AT BEDTIME   topiramate 25 MG tablet Commonly known as: Topamax Take  3 tablets (75 mg total) by mouth daily.       Allergies  Allergen Reactions  . Erythromycin Rash  . Paroxetine Palpitations  . Sulfonamide Derivatives Rash    Procedures/Studies: CT ABDOMEN PELVIS W CONTRAST  Result Date: 08/02/2019 CLINICAL DATA:  Bright red blood emesis this morning. History of gastric ulcer. EXAM: CT ABDOMEN AND PELVIS WITH CONTRAST TECHNIQUE: Multidetector CT imaging of the abdomen and pelvis was performed using the  standard protocol following bolus administration of intravenous contrast. CONTRAST:  145mL OMNIPAQUE IOHEXOL 300 MG/ML  SOLN COMPARISON:  None. FINDINGS: Lower chest: Clear lung bases. No significant pleural or pericardial effusion. Hepatobiliary: The liver is normal in density without suspicious focal abnormality. Multiple small calcified gallstones. No evidence of gallbladder wall thickening, biliary dilatation or surrounding inflammatory changes. Pancreas: Unremarkable. No pancreatic ductal dilatation or surrounding inflammatory changes. Spleen: Normal in size without focal abnormality. Adrenals/Urinary Tract: Both adrenal glands appear normal. The kidneys appear normal without evidence of urinary tract calculus, suspicious lesion or hydronephrosis. No bladder abnormalities are seen. Stomach/Bowel: No evidence of bowel wall thickening, distention or surrounding inflammatory change. No gastric or duodenal ulcers identified. No evidence of active GI bleeding. The appendix appears normal. Vascular/Lymphatic: There are no enlarged abdominal or pelvic lymph nodes. No significant vascular findings. Reproductive: The uterus and ovaries appear normal. No adnexal mass. Other: No free air, ascites or focal extraluminal fluid collection. Bilateral breast implants are noted. Musculoskeletal: No acute or significant osseous findings. IMPRESSION: 1. No acute findings or explanation for the patient's symptoms. No visible gastric or duodenal ulcers. 2. Cholelithiasis. Electronically Signed   By: Richardean Sale M.D.   On: 08/02/2019 15:22       Discharge Exam: Vitals:   08/02/19 2153 08/03/19 0642  BP: 138/80 104/73  Pulse: 98 82  Resp: 18 16  Temp: 98.7 F (37.1 C) 98.5 F (36.9 C)  SpO2:  100%   Vitals:   08/02/19 2115 08/02/19 2153 08/02/19 2217 08/03/19 0642  BP: 132/85 138/80  104/73  Pulse:  98  82  Resp:  18  16  Temp:  98.7 F (37.1 C)  98.5 F (36.9 C)  TempSrc:  Oral  Oral  SpO2: 97%   100%   Weight:   74.8 kg   Height:   5\' 4"  (1.626 m)     The results of significant diagnostics from this hospitalization (including imaging, microbiology, ancillary and laboratory) are listed below for reference.     Microbiology: Recent Results (from the past 240 hour(s))  SARS CORONAVIRUS 2 (TAT 6-24 HRS) Nasopharyngeal Nasopharyngeal Swab     Status: None   Collection Time: 08/02/19  4:54 PM   Specimen: Nasopharyngeal Swab  Result Value Ref Range Status   SARS Coronavirus 2 NEGATIVE NEGATIVE Final    Comment: (NOTE) SARS-CoV-2 target nucleic acids are NOT DETECTED. The SARS-CoV-2 RNA is generally detectable in upper and lower respiratory specimens during the acute phase of infection. Negative results do not preclude SARS-CoV-2 infection, do not rule out co-infections with other pathogens, and should not be used as the sole basis for treatment or other patient management decisions. Negative results must be combined with clinical observations, patient history, and epidemiological information. The expected result is Negative. Fact Sheet for Patients: SugarRoll.be Fact Sheet for Healthcare Providers: https://www.woods-mathews.com/ This test is not yet approved or cleared by the [place] FDA and  has been authorized for detection and/or diagnosis of SARS-CoV-2 by FDA under an Emergency Use Authorization (EUA). This  EUA will remain  in effect (meaning this test can be used) for the duration of the COVID-19 declaration under Section 56 4(b)(1) of the Act, 21 U.S.C. section 360bbb-3(b)(1), unless the authorization is terminated or revoked sooner. Performed at [facility], [place] [address], [place], [place] [address]      Labs: BNP (last 3 results) No results for input(s): BNP in the last 8760 hours. Basic Metabolic Panel: Recent Labs  Lab 08/02/19 1106 08/03/19 0501  NA 139 143  K 3.7 4.5  CL 106 111  CO2 24 22  GLUCOSE 113*  96  BUN 7 7  CREATININE 0.71 0.74  CALCIUM 8.9 8.6*   Liver Function Tests: Recent Labs  Lab 08/02/19 1106  AST 17  ALT 15  ALKPHOS 81  BILITOT 0.4  PROT 7.3  ALBUMIN 3.4*   Recent Labs  Lab 08/02/19 1314  LIPASE 30   No results for input(s): AMMONIA in the last 168 hours. CBC: Recent Labs  Lab 08/02/19 1106 08/03/19 0501  WBC 8.5 8.2  HGB 13.6 12.2  HCT 43.0 38.8  MCV 91.1 91.5  PLT 453* 415*   Cardiac Enzymes: No results for input(s): CKTOTAL, CKMB, CKMBINDEX, TROPONINI in the last 168 hours. BNP: Invalid input(s): POCBNP CBG: No results for input(s): GLUCAP in the last 168 hours. D-Dimer No results for input(s): DDIMER in the last 72 hours. Hgb A1c No results for input(s): HGBA1C in the last 72 hours. Lipid Profile No results for input(s): CHOL, HDL, LDLCALC, TRIG, CHOLHDL, LDLDIRECT in the last 72 hours. Thyroid function studies No results for input(s): TSH, T4TOTAL, T3FREE, THYROIDAB in the last 72 hours.  Invalid input(s): FREET3 Anemia work up No results for input(s): VITAMINB12, FOLATE, FERRITIN, TIBC, IRON, RETICCTPCT in the last 72 hours. Urinalysis    Component Value Date/Time   COLORURINE STRAW (A) 06/04/2017 1206   APPEARANCEUR CLEAR 06/04/2017 1206   APPEARANCEUR Clear 10/24/2013 1651   LABSPEC 1.003 (L) 06/04/2017 1206   LABSPEC 1.014 10/24/2013 1651   PHURINE 6.0 06/04/2017 1206   GLUCOSEU NEGATIVE 06/04/2017 1206   GLUCOSEU Negative 10/24/2013 1651   HGBUR MODERATE (A) 06/04/2017 1206   BILIRUBINUR NEGATIVE 06/04/2017 1206   BILIRUBINUR Negative 10/24/2013 1651   KETONESUR NEGATIVE 06/04/2017 1206   PROTEINUR NEGATIVE 06/04/2017 1206   UROBILINOGEN 0.2 04/03/2012 1817   NITRITE NEGATIVE 06/04/2017 1206   LEUKOCYTESUR NEGATIVE 06/04/2017 1206   LEUKOCYTESUR Negative 10/24/2013 1651   Sepsis Labs Invalid input(s): PROCALCITONIN,  WBC,  LACTICIDVEN Microbiology Recent Results (from the past 240 hour(s))  SARS CORONAVIRUS 2 (TAT  6-24 HRS) Nasopharyngeal Nasopharyngeal Swab     Status: None   Collection Time: 08/02/19  4:54 PM   Specimen: Nasopharyngeal Swab  Result Value Ref Range Status   SARS Coronavirus 2 NEGATIVE NEGATIVE Final    Comment: (NOTE) SARS-CoV-2 target nucleic acids are NOT DETECTED. The SARS-CoV-2 RNA is generally detectable in upper and lower respiratory specimens during the acute phase of infection. Negative results do not preclude SARS-CoV-2 infection, do not rule out co-infections with other pathogens, and should not be used as the sole basis for treatment or other patient management decisions. Negative results must be combined with clinical observations, patient history, and epidemiological information. The expected result is Negative. Fact Sheet for Patients: SugarRoll.be Fact Sheet for Healthcare Providers: https://www.woods-mathews.com/ This test is not yet approved or cleared by the [place] FDA and  has been authorized for detection and/or diagnosis of SARS-CoV-2 by FDA under an Emergency  Use Authorization (EUA). This EUA will remain  in effect (meaning this test can be used) for the duration of the COVID-19 declaration under Section 56 4(b)(1) of the Act, 21 U.S.C. section 360bbb-3(b)(1), unless the authorization is terminated or revoked sooner. Performed at [facility], [place] [address], [place], [place] [address]     SIGNED:  Little Ishikawa, DO [facility] 08/03/2019, 4:25 PM Pager   If 7PM-7AM, please contact night-coverage [email or web address]

## 2019-08-03 NOTE — Telephone Encounter (Signed)
Patient was advised that her referral was already placed and she can reach out to  GI to schedule appointment. Patient would like a work note due to the hospital not giving her one for work. She sates that the doctor at the hospital told her that he will not write her a note due to her leaving the hospital without having her outpatient procedure done.Please advise.

## 2019-08-03 NOTE — Telephone Encounter (Signed)
Copied from [place] [id number]. Topic: Referral - Request for Referral >> Aug 03, 2019  9:30 AM Erick Blinks wrote: Has patient seen PCP for this complaint? Yes.   *If NO, is insurance requiring patient see PCP for this issue before PCP can refer them? Referral for which specialty: GI Preferred provider/office: Highest recommended  Reason for referral: Same issues as last time

## 2019-08-07 ENCOUNTER — Encounter: Payer: Self-pay | Admitting: Gastroenterology

## 2019-08-08 ENCOUNTER — Telehealth (INDEPENDENT_AMBULATORY_CARE_PROVIDER_SITE_OTHER): Payer: Medicaid Other | Admitting: Gastroenterology

## 2019-08-08 ENCOUNTER — Other Ambulatory Visit: Payer: Self-pay

## 2019-08-08 ENCOUNTER — Encounter: Payer: Self-pay | Admitting: Gastroenterology

## 2019-08-08 DIAGNOSIS — R1012 Left upper quadrant pain: Secondary | ICD-10-CM

## 2019-08-08 DIAGNOSIS — R109 Unspecified abdominal pain: Secondary | ICD-10-CM

## 2019-08-08 MED ORDER — PANTOPRAZOLE SODIUM 40 MG PO TBEC: 40.0000 mg | DELAYED_RELEASE_TABLET | Freq: Two times a day (BID) | ORAL | 0 refills | Status: DC

## 2019-08-08 NOTE — Progress Notes (Signed)
Sharon King [address] [place]  [place], [place] [address]  Main: [phone]  Fax: [phone]   Gastroenterology Consultation  Referring Provider:     Paulene Floor Primary Care Physician:  Paulene Floor Reason for Consultation:    Abdominal pain        HPI:   Virtual Visit via Video Note  I connected with patient on 08/08/19 at 10:00 AM EDT by video ([email or web address]) and verified that I am speaking with the correct person using two identifiers.   I discussed the limitations, risks, security and privacy concerns of performing an evaluation and management service by video and the availability of in person appointments. I also discussed with the patient that there may be a patient responsible charge related to this service. The patient expressed understanding and agreed to proceed.  Location of the patient: Home Location of provider: Home Participating persons: Patient and provider only (Nursing staff checked in patient via phone but were not physically involved in the video interaction - see their notes)   History of Present Illness: Chief Complaint  Patient presents with  . New Patient (Initial Visit)  . Abdominal Pain    Patient is having LUQ pain that is pretty constant   . Nausea    That comes and goes   . Cough    Patient has a dry cough for 1.5 months     Sharon King is a 34 y.o. y/o female referred for consultation & management  by Dr. Terrilee Croak, Wendee Beavers, PA-C.  Patient with 1 to 109-month history of left upper quadrant abdominal pain.  Was admitted on April 21 as she also reported one episode of hematemesis.  Was seen by GI and upper endoscopy was planned but patient left AMA.  Patient reports no previous history of similar symptoms in report ongoing constant pain in the left upper quadrant for the last 1 to 2 months.  Was previously taking NSAIDs for migraine but since the symptoms started she has stopped taking NSAIDs.  Is on  Protonix 40 mg once daily which has not helped her pain at all.  No dysphagia.  No blood in stool.  No weight loss.  Hemoglobin was normal during her hospital admission.  No prior EGD or colonoscopy  Past Medical History:  Diagnosis Date  . Allergy   . Anxiety   . Asthma   . Cancer ([place])   . GERD (gastroesophageal reflux disease)   . History of preterm delivery   . Migraines   . Neuromuscular disorder ([place])   . Seizures ([place])    Last seizure 2014  . Sleep apnea   . Trigeminal neuralgia     Past Surgical History:  Procedure Laterality Date  . BREAST ENHANCEMENT SURGERY  2008  . BREAST SURGERY    . CESAREAN SECTION N/A 06/13/2015   Procedure: CESAREAN SECTION;  Surgeon: Lavonia Drafts, MD;  Location: [facility];  Service: Obstetrics;  Laterality: N/A;  . COSMETIC SURGERY    . wisdom teeth      Prior to Admission medications   Medication Sig Start Date End Date Taking? Authorizing Provider  norgestimate-ethinyl estradiol (ORTHO-CYCLEN) 0.25-35 MG-MCG tablet Take 1 tablet by mouth daily. Take active pills in a continuous fashion. Have a withdrawal bleed once every 3-6 months Patient taking differently: Take 1 tablet by mouth at bedtime. Take active pills in a continuous fashion. Have a withdrawal bleed once every 3-6 months 03/13/19  Yes [name]  C, CNM  pantoprazole (PROTONIX) 40 MG tablet Take 1 tablet (40 mg total) by mouth daily. 06/16/19 09/14/19 Yes Pollak, Wendee Beavers, PA-C  promethazine (PHENERGAN) 12.5 MG tablet TAKE ONE TABLET BY MOUTH EVERY 6 HOURS AS NEEDED FOR NAUSEA / VOMITING 08/04/19  Yes Teague Bobbye Morton, PA-C  REYVOW 100 MG TABS Take 1 tablet by mouth once. May repeat in 2 hours 07/05/19  Yes [provider]  sucralfate (CARAFATE) 1 g tablet Take 1 tablet (1 g total) by mouth 4 (four) times daily -  with meals and at bedtime. 06/16/19 09/14/19 Yes Trinna Post, PA-C  SUMAtriptan (IMITREX) 100 MG tablet Take 1 tablet (100 mg total) by mouth once as  needed for up to 1 dose for migraine. May repeat in 2 hours if headache persists or recurs. 12/16/18  Yes Teague Bobbye Morton, PA-C  tiZANidine (ZANAFLEX) 4 MG tablet TAKE TWO TABLETS BY MOUTH AT BEDTIME Patient taking differently: Take 4 mg by mouth at bedtime as needed for muscle spasms.  06/13/19  Yes Trinna Post, PA-C  topiramate (TOPAMAX) 25 MG tablet Take 3 tablets (75 mg total) by mouth daily. Patient taking differently: Take 75 mg by mouth at bedtime.  06/23/19  Yes Teague Bobbye Morton, PA-C  amitriptyline (ELAVIL) 50 MG tablet TAKE 1 TABLET BY MOUTH EVERYDAY AT BEDTIME Patient not taking: Reported on 08/02/2019 12/16/18   Jaclyn Prime, Collene Leyden, PA-C    Family History  Problem Relation Age of Onset  . Hyperlipidemia Mother   . Arthritis Mother   . Depression Mother   . Arthritis Father   . Depression Father   . Alcohol abuse Father      Social History   Tobacco Use  . Smoking status: Current Some Day Smoker  . Smokeless tobacco: Former Network engineer Use Topics  . Alcohol use: No    Comment: rare-stopped with pregnancy  . Drug use: No    Allergies as of 08/08/2019 - Review Complete 08/08/2019  Allergen Reaction Noted  . Erythromycin Rash   . Paroxetine Palpitations   . Sulfonamide derivatives Rash     Review of Systems:    All systems reviewed and negative except where noted in HPI.   Observations/Objective:  Labs: CBC    Component Value Date/Time   WBC 8.2 08/03/2019 0501   RBC 4.24 08/03/2019 0501   HGB 12.2 08/03/2019 0501   HGB 13.1 06/16/2019 0929   HCT 38.8 08/03/2019 0501   HCT 39.3 06/16/2019 0929   PLT 415 (H) 08/03/2019 0501   PLT 418 06/16/2019 0929   MCV 91.5 08/03/2019 0501   MCV 90 06/16/2019 0929   MCV 94 10/24/2013 1650   MCH 28.8 08/03/2019 0501   MCHC 31.4 08/03/2019 0501   RDW 13.4 08/03/2019 0501   RDW 13.0 06/16/2019 0929   RDW 12.8 10/24/2013 1650   LYMPHSABS 2.6 06/16/2019 0929   LYMPHSABS 4.2 (H) 11/23/2012 0750    MONOABS 0.8 12/13/2014 1038   MONOABS 0.6 11/23/2012 0750   EOSABS 0.2 06/16/2019 0929   EOSABS 0.1 11/23/2012 0750   BASOSABS 0.1 06/16/2019 0929   BASOSABS 0.0 11/23/2012 0750   CMP     Component Value Date/Time   NA 143 08/03/2019 0501   NA 139 06/16/2019 0929   NA 140 10/24/2013 1650   K 4.5 08/03/2019 0501   K 3.5 10/24/2013 1650   CL 111 08/03/2019 0501   CL 109 (H) 10/24/2013 1650   CO2 22  08/03/2019 0501   CO2 24 10/24/2013 1650   GLUCOSE 96 08/03/2019 0501   GLUCOSE 106 (H) 10/24/2013 1650   BUN 7 08/03/2019 0501   BUN 9 06/16/2019 0929   BUN 9 10/24/2013 1650   CREATININE 0.74 08/03/2019 0501   CREATININE 0.63 10/24/2013 1650   CALCIUM 8.6 (L) 08/03/2019 0501   CALCIUM 8.8 10/24/2013 1650   PROT 7.3 08/02/2019 1106   PROT 6.9 06/16/2019 0929   PROT 7.2 10/24/2013 1650   ALBUMIN 3.4 (L) 08/02/2019 1106   ALBUMIN 3.9 06/16/2019 0929   ALBUMIN 3.5 10/24/2013 1650   AST 17 08/02/2019 1106   AST 8 (L) 10/24/2013 1650   ALT 15 08/02/2019 1106   ALT 10 (L) 10/24/2013 1650   ALKPHOS 81 08/02/2019 1106   ALKPHOS 57 10/24/2013 1650   BILITOT 0.4 08/02/2019 1106   BILITOT <0.2 06/16/2019 0929   BILITOT 0.4 10/24/2013 1650   GFRNONAA >60 08/03/2019 0501   GFRNONAA >60 10/24/2013 1650   GFRAA >60 08/03/2019 0501   GFRAA >60 10/24/2013 1650    Imaging Studies: CT ABDOMEN PELVIS W CONTRAST  Result Date: 08/02/2019 CLINICAL DATA:  Bright red blood emesis this morning. History of gastric ulcer. EXAM: CT ABDOMEN AND PELVIS WITH CONTRAST TECHNIQUE: Multidetector CT imaging of the abdomen and pelvis was performed using the standard protocol following bolus administration of intravenous contrast. CONTRAST:  161mL OMNIPAQUE IOHEXOL 300 MG/ML  SOLN COMPARISON:  None. FINDINGS: Lower chest: Clear lung bases. No significant pleural or pericardial effusion. Hepatobiliary: The liver is normal in density without suspicious focal abnormality. Multiple small calcified gallstones. No  evidence of gallbladder wall thickening, biliary dilatation or surrounding inflammatory changes. Pancreas: Unremarkable. No pancreatic ductal dilatation or surrounding inflammatory changes. Spleen: Normal in size without focal abnormality. Adrenals/Urinary Tract: Both adrenal glands appear normal. The kidneys appear normal without evidence of urinary tract calculus, suspicious lesion or hydronephrosis. No bladder abnormalities are seen. Stomach/Bowel: No evidence of bowel wall thickening, distention or surrounding inflammatory change. No gastric or duodenal ulcers identified. No evidence of active GI bleeding. The appendix appears normal. Vascular/Lymphatic: There are no enlarged abdominal or pelvic lymph nodes. No significant vascular findings. Reproductive: The uterus and ovaries appear normal. No adnexal mass. Other: No free air, ascites or focal extraluminal fluid collection. Bilateral breast implants are noted. Musculoskeletal: No acute or significant osseous findings. IMPRESSION: 1. No acute findings or explanation for the patient's symptoms. No visible gastric or duodenal ulcers. 2. Cholelithiasis. Electronically Signed   By: Richardean Sale M.D.   On: 08/02/2019 15:22    Assessment and Plan:   Sharon King is a 34 y.o. y/o female has been referred for abdominal pain  Assessment and Plan: Symptoms possibly related to small ulcers or GERD given previous NSAID use  Symptoms unrelieved with PPI and patient had one episode of hematemesis prior to her hospital admission  Due to ongoing symptoms despite PPI, EGD indicated for further evaluation  Avoid NSAID use such as Ibuprofen, Aleeve, advil, motrin, BC and Goodie powder, Naproxen, Meloxicam and others.   I have discussed alternative options, risks & benefits,  which include, but are not limited to, bleeding, infection, perforation,respiratory complication & drug reaction.  The patient agrees with this plan & written consent will be  obtained.    Increase PPI to twice daily for 30 days  (Risks of PPI use were discussed with patient including bone loss, C. Diff diarrhea, pneumonia, infections, CKD, electrolyte abnormalities.  Pt. Verbalizes understanding and chooses  to continue the medication.)    Follow Up Instructions:   I discussed the assessment and treatment plan with the patient. The patient was provided an opportunity to ask questions and all were answered. The patient agreed with the plan and demonstrated an understanding of the instructions.   The patient was advised to call back or seek an in-person evaluation if the symptoms worsen or if the condition fails to improve as anticipated.  I provided 15 minutes of face-to-face time via video software during this encounter.  Additional time was spent in reviewing patient's chart, placing orders etc.   Virgel Manifold, MD  Speech recognition software was used to dictate the above note.

## 2019-08-09 ENCOUNTER — Encounter: Payer: Self-pay | Admitting: Radiology

## 2019-08-09 ENCOUNTER — Other Ambulatory Visit: Payer: Self-pay | Admitting: Physician Assistant

## 2019-08-09 DIAGNOSIS — M62838 Other muscle spasm: Secondary | ICD-10-CM

## 2019-08-28 ENCOUNTER — Other Ambulatory Visit: Payer: Self-pay

## 2019-08-28 ENCOUNTER — Other Ambulatory Visit
Admission: RE | Admit: 2019-08-28 | Discharge: 2019-08-28 | Disposition: A | Discharge status: Home / Self Care | Payer: Medicaid Other | Source: Ambulatory Visit | Attending: Gastroenterology | Admitting: Gastroenterology

## 2019-08-28 DIAGNOSIS — Z01812 Encounter for preprocedural laboratory examination: Secondary | ICD-10-CM | POA: Diagnosis not present

## 2019-08-28 DIAGNOSIS — Z20822 Contact with and (suspected) exposure to covid-19: Secondary | ICD-10-CM | POA: Diagnosis not present

## 2019-08-28 LAB — SARS CORONAVIRUS 2 (TAT 6-24 HRS): SARS Coronavirus 2: NEGATIVE

## 2019-08-30 ENCOUNTER — Encounter
Admission: RE | Disposition: A | Discharge status: Home / Self Care | Payer: Self-pay | Source: Home / Self Care | Attending: Gastroenterology

## 2019-08-30 ENCOUNTER — Ambulatory Visit: Payer: Medicaid Other | Admitting: Anesthesiology

## 2019-08-30 ENCOUNTER — Ambulatory Visit
Admission: RE | Admit: 2019-08-30 | Discharge: 2019-08-30 | Disposition: A | Discharge status: Home / Self Care | Payer: Medicaid Other | Attending: Gastroenterology | Admitting: Gastroenterology

## 2019-08-30 ENCOUNTER — Other Ambulatory Visit: Payer: Self-pay

## 2019-08-30 ENCOUNTER — Encounter: Payer: Self-pay | Admitting: Gastroenterology

## 2019-08-30 DIAGNOSIS — K2950 Unspecified chronic gastritis without bleeding: Secondary | ICD-10-CM | POA: Diagnosis not present

## 2019-08-30 DIAGNOSIS — Z881 Allergy status to other antibiotic agents status: Secondary | ICD-10-CM | POA: Diagnosis not present

## 2019-08-30 DIAGNOSIS — G4730 Sleep apnea, unspecified: Secondary | ICD-10-CM | POA: Insufficient documentation

## 2019-08-30 DIAGNOSIS — Z79899 Other long term (current) drug therapy: Secondary | ICD-10-CM | POA: Diagnosis not present

## 2019-08-30 DIAGNOSIS — G43909 Migraine, unspecified, not intractable, without status migrainosus: Secondary | ICD-10-CM | POA: Diagnosis not present

## 2019-08-30 DIAGNOSIS — J45909 Unspecified asthma, uncomplicated: Secondary | ICD-10-CM | POA: Insufficient documentation

## 2019-08-30 DIAGNOSIS — F17200 Nicotine dependence, unspecified, uncomplicated: Secondary | ICD-10-CM | POA: Diagnosis not present

## 2019-08-30 DIAGNOSIS — Z888 Allergy status to other drugs, medicaments and biological substances status: Secondary | ICD-10-CM | POA: Diagnosis not present

## 2019-08-30 DIAGNOSIS — K219 Gastro-esophageal reflux disease without esophagitis: Secondary | ICD-10-CM | POA: Insufficient documentation

## 2019-08-30 DIAGNOSIS — Z882 Allergy status to sulfonamides status: Secondary | ICD-10-CM | POA: Insufficient documentation

## 2019-08-30 DIAGNOSIS — Z793 Long term (current) use of hormonal contraceptives: Secondary | ICD-10-CM | POA: Insufficient documentation

## 2019-08-30 DIAGNOSIS — R12 Heartburn: Secondary | ICD-10-CM | POA: Insufficient documentation

## 2019-08-30 DIAGNOSIS — R109 Unspecified abdominal pain: Secondary | ICD-10-CM

## 2019-08-30 HISTORY — PX: ESOPHAGOGASTRODUODENOSCOPY (EGD) WITH PROPOFOL: SHX5813

## 2019-08-30 LAB — POCT PREGNANCY, URINE: Preg Test, Ur: NEGATIVE

## 2019-08-30 SURGERY — ESOPHAGOGASTRODUODENOSCOPY (EGD) WITH PROPOFOL
Anesthesia: General

## 2019-08-30 MED ORDER — SODIUM CHLORIDE 0.9 % IV SOLN: INTRAVENOUS | Status: DC | PRN

## 2019-08-30 MED ORDER — PROPOFOL 10 MG/ML IV BOLUS
INTRAVENOUS | Status: DC | PRN
  Administered 2019-08-30: 30 mg via INTRAVENOUS
  Administered 2019-08-30: 100 mg via INTRAVENOUS

## 2019-08-30 MED ORDER — PROPOFOL 500 MG/50ML IV EMUL
INTRAVENOUS | Status: DC | PRN
  Administered 2019-08-30: 200 ug/kg/min via INTRAVENOUS

## 2019-08-30 MED ORDER — LIDOCAINE HCL (PF) 2 % IJ SOLN
INTRAMUSCULAR | Status: DC | PRN
  Administered 2019-08-30: 100 mg via INTRADERMAL

## 2019-08-30 MED ORDER — OMEPRAZOLE 20 MG PO CPDR
20.0000 mg | DELAYED_RELEASE_CAPSULE | Freq: Two times a day (BID) | ORAL | 0 refills | Status: DC
Start: 2019-08-30 — End: 2019-09-26

## 2019-08-30 MED ORDER — SODIUM CHLORIDE 0.9 % IV SOLN: INTRAVENOUS | Status: DC

## 2019-08-30 NOTE — Transfer of Care (Signed)
Immediate Anesthesia Transfer of Care Note  Patient: Sharon King  Procedure(s) Performed: ESOPHAGOGASTRODUODENOSCOPY (EGD) WITH PROPOFOL (N/A )  Patient Location: PACU  Anesthesia Type:General  Level of Consciousness: awake, alert  and oriented  Airway & Oxygen Therapy: Patient Spontanous Breathing and Patient connected to nasal cannula oxygen  Post-op Assessment: Report given to RN and Post -op Vital signs reviewed and stable  Post vital signs: Reviewed and stable  Last Vitals:  Vitals Value Taken Time  BP 115/75 08/30/19 0957  Temp 36.1 C 08/30/19 0956  Pulse 88 08/30/19 0958  Resp 15 08/30/19 0958  SpO2 99 % 08/30/19 0958  Vitals shown include unvalidated device data.  Last Pain:  Vitals:   08/30/19 0956  TempSrc: Temporal  PainSc: 0-No pain         Complications: No apparent anesthesia complications

## 2019-08-30 NOTE — H&P (Signed)
[name], MD [address] [place], [place], [place], [address] [address], [place], [place], [place], [address] Phone: [phone]  Fax: [phone]  Primary Care Physician:  Trinna Post, PA-C   Pre-Procedure History & Physical: HPI:  Marth Wiltfong is a 34 y.o. female is here for an EGD.   Past Medical History:  Diagnosis Date  . Allergy   . Anxiety   . Asthma   . Cancer ([place])   . GERD (gastroesophageal reflux disease)   . History of preterm delivery   . Migraines   . Neuromuscular disorder ([place])   . Seizures ([place])    Last seizure 2014  . Sleep apnea   . Trigeminal neuralgia     Past Surgical History:  Procedure Laterality Date  . BREAST ENHANCEMENT SURGERY  2008  . BREAST SURGERY    . CESAREAN SECTION N/A 06/13/2015   Procedure: CESAREAN SECTION;  Surgeon: Lavonia Drafts, MD;  Location: [facility];  Service: Obstetrics;  Laterality: N/A;  . COSMETIC SURGERY    . wisdom teeth      Prior to Admission medications   Medication Sig Start Date End Date Taking? Authorizing Provider  amitriptyline (ELAVIL) 50 MG tablet TAKE 1 TABLET BY MOUTH EVERYDAY AT BEDTIME Patient not taking: Reported on 08/02/2019 12/16/18   Jaclyn Prime, Collene Leyden, PA-C  norgestimate-ethinyl estradiol (ORTHO-CYCLEN) 0.25-35 MG-MCG tablet Take 1 tablet by mouth daily. Take active pills in a continuous fashion. Have a withdrawal bleed once every 3-6 months Patient taking differently: Take 1 tablet by mouth at bedtime. Take active pills in a continuous fashion. Have a withdrawal bleed once every 3-6 months 03/13/19   Darlina Rumpf, CNM  pantoprazole (PROTONIX) 40 MG tablet Take 1 tablet (40 mg total) by mouth 2 (two) times daily. 08/08/19   Virgel Manifold, MD  promethazine (PHENERGAN) 12.5 MG tablet TAKE ONE TABLET BY MOUTH EVERY 6 HOURS AS NEEDED FOR NAUSEA / VOMITING 08/04/19   Jaclyn Prime, Collene Leyden, PA-C  REYVOW 100 MG TABS Take 1 tablet by mouth once. May repeat  in 2 hours 07/05/19   [provider]  sucralfate (CARAFATE) 1 g tablet Take 1 tablet (1 g total) by mouth 4 (four) times daily -  with meals and at bedtime. 06/16/19 09/14/19  Trinna Post, PA-C  SUMAtriptan (IMITREX) 100 MG tablet Take 1 tablet (100 mg total) by mouth once as needed for up to 1 dose for migraine. May repeat in 2 hours if headache persists or recurs. 12/16/18   Jaclyn Prime, Collene Leyden, PA-C  tiZANidine (ZANAFLEX) 4 MG tablet TAKE TWO TABLETS BY MOUTH AT BEDTIME 08/10/19   Trinna Post, PA-C  topiramate (TOPAMAX) 25 MG tablet Take 3 tablets (75 mg total) by mouth daily. Patient taking differently: Take 75 mg by mouth at bedtime.  06/23/19   Jaclyn Prime, Collene Leyden, PA-C    Allergies as of 08/08/2019 - Review Complete 08/08/2019  Allergen Reaction Noted  . Erythromycin Rash   . Paroxetine Palpitations   . Sulfonamide derivatives Rash     Family History  Problem Relation Age of Onset  . Hyperlipidemia Mother   . Arthritis Mother   . Depression Mother   . Arthritis Father   . Depression Father   . Alcohol abuse Father     Social History   Socioeconomic History  . Marital status: Legally Separated    Spouse name: Not on file  . Number of children: Not on file  .  Years of education: Not on file  . Highest education level: Not on file  Occupational History  . Not on file  Tobacco Use  . Smoking status: Current Some Day Smoker  . Smokeless tobacco: Former Network engineer and Sexual Activity  . Alcohol use: No    Comment: rare-stopped with pregnancy  . Drug use: No  . Sexual activity: Not Currently    Birth control/protection: Pill  Other Topics Concern  . Not on file  Social History Narrative  . Not on file   Social Determinants of Health   Financial Resource Strain:   . Difficulty of Paying Living Expenses:   Food Insecurity:   . Worried About Charity fundraiser in the Last Year:   . Arboriculturist in the Last Year:   Transportation Needs:     . Film/video editor (Medical):   Marland Kitchen Lack of Transportation (Non-Medical):   Physical Activity:   . Days of Exercise per Week:   . Minutes of Exercise per Session:   Stress:   . Feeling of Stress :   Social Connections:   . Frequency of Communication with Friends and Family:   . Frequency of Social Gatherings with Friends and Family:   . Attends Religious Services:   . Active Member of Clubs or Organizations:   . Attends Archivist Meetings:   Marland Kitchen Marital Status:   Intimate Partner Violence:   . Fear of Current or Ex-Partner:   . Emotionally Abused:   Marland Kitchen Physically Abused:   . Sexually Abused:     Review of Systems: See HPI, otherwise negative ROS  Physical Exam: BP (!) 141/94   Pulse 78   Temp (!) 97.5 F (36.4 C) (Temporal)   Resp 16   Ht 5\' 4"  (1.626 m)   Wt 77.1 kg   SpO2 100%   BMI 29.18 kg/m  General:   Alert,  pleasant and cooperative in NAD Head:  Normocephalic and atraumatic. Neck:  Supple; no masses or thyromegaly. Lungs:  Clear throughout to auscultation, normal respiratory effort.    Heart:  +S1, +S2, Regular rate and rhythm, No edema. Abdomen:  Soft, nontender and nondistended. Normal bowel sounds, without guarding, and without rebound.   Neurologic:  Alert and  oriented x4;  grossly normal neurologically.  Impression/Plan: Illana Boyce is here for an EGD forabdominal pain  Risks, benefits, limitations, and alternatives regarding the procedure have been reviewed with the patient.  Questions have been answered.  All parties agreeable.   Virgel Manifold, MD  08/30/2019, 9:27 AM

## 2019-08-30 NOTE — Op Note (Signed)
[facility] Patient Name: Sharon King Procedure Date: 08/30/2019 9:20 AM MRN: [id number] Account #: [id number] Date of Birth: 1985/11/09 Admit Type: Outpatient Age: 34 Room: [facility] ENDO ROOM 4 Gender: Female Note Status: Finalized Procedure:             Upper GI endoscopy Indications:           Abdominal pain, Heartburn Providers:             Garon Melander B. Bonna Gains MD, MD Medicines:             Monitored Anesthesia Care Complications:         No immediate complications. Procedure:             Pre-Anesthesia Assessment:                        - Prior to the procedure, a History and Physical was                         performed, and patient medications, allergies and                         sensitivities were reviewed. The patient's tolerance                         of previous anesthesia was reviewed.                        - The risks and benefits of the procedure and the                         sedation options and risks were discussed with the                         patient. All questions were answered and informed                         consent was obtained.                        - Patient identification and proposed procedure were                         verified prior to the procedure by the physician, the                         nurse, the anesthesiologist, the anesthetist and the                         technician. The procedure was verified in the                         procedure room.                        - ASA Grade Assessment: II - A patient with mild                         systemic disease.  After obtaining informed consent, the endoscope was                         passed under direct vision. Throughout the procedure,                         the patient's blood pressure, pulse, and oxygen                         saturations were monitored continuously. The Endoscope                         was introduced  through the mouth, and advanced to the                         second part of duodenum. The upper GI endoscopy was                         accomplished with ease. The patient tolerated the                         procedure well. Findings:      The examined esophagus was normal.      The entire examined stomach was normal. Biopsies were obtained in the       gastric body, at the incisura and in the gastric antrum with cold       forceps for histology. Biopsies were taken with a cold forceps for       Helicobacter pylori testing.      The duodenal bulb, second portion of the duodenum and examined duodenum       were normal. Impression:            - Normal esophagus.                        - Normal stomach. Biopsied.                        - Normal duodenal bulb, second portion of the duodenum                         and examined duodenum.                        - Biopsies were obtained in the gastric body, at the                         incisura and in the gastric antrum. Recommendation:        - Await pathology results.                        - Discharge patient to home (with escort).                        - Advance diet as tolerated.                        - Continue present medications.                        -  Patient has a contact number available for                         emergencies. The signs and symptoms of potential                         delayed complications were discussed with the patient.                         Return to normal activities tomorrow. Written                         discharge instructions were provided to the patient.                        - Discharge patient to home (with escort).                        - The findings and recommendations were discussed with                         the patient.                        - The findings and recommendations were discussed with                         the patient's family. Procedure Code(s):     ---  Professional ---                        [phone], Esophagogastroduodenoscopy, flexible,                         transoral; with biopsy, single or multiple Diagnosis Code(s):     --- Professional ---                        R10.9, Unspecified abdominal pain                        R12, Heartburn CPT copyright 2019 American Medical Association. All rights reserved. The codes documented in this report are preliminary and upon coder review may  be revised to meet current compliance requirements.  [name], MD Margretta Sidle B. Bonna Gains MD, MD 08/30/2019 9:59:39 AM This report has been signed electronically. Number of Addenda: 0 Note Initiated On: 08/30/2019 9:20 AM Estimated Blood Loss:  Estimated blood loss: none.      [facility]

## 2019-08-30 NOTE — Anesthesia Procedure Notes (Signed)
Date/Time: 08/30/2019 9:50 AM Performed by: Nelda Marseille, CRNA Pre-anesthesia Checklist: Patient identified, Emergency Drugs available, Suction available, Patient being monitored and Timeout performed Oxygen Delivery Method: Nasal cannula

## 2019-08-30 NOTE — Anesthesia Preprocedure Evaluation (Signed)
Anesthesia Evaluation  Patient identified by MRN, date of birth, ID band Patient awake    Reviewed: Allergy & Precautions, H&P , NPO status , Patient's Chart, lab work & pertinent test results  Airway Mallampati: II  TM Distance: >3 FB Neck ROM: full    Dental  (+) Poor Dentition   Pulmonary asthma , neg sleep apnea, Current Smoker,    breath sounds clear to auscultation       Cardiovascular negative cardio ROS   Rhythm:regular Rate:Normal     Neuro/Psych  Headaches, Anxiety    GI/Hepatic Neg liver ROS, GERD  ,  Endo/Other  negative endocrine ROS  Renal/GU negative Renal ROS  negative genitourinary   Musculoskeletal   Abdominal   Peds  Hematology negative hematology ROS (+)   Anesthesia Other Findings Past Medical History: No date: Allergy No date: Anxiety No date: Asthma No date: Cancer ([place]) No date: GERD (gastroesophageal reflux disease) No date: History of preterm delivery No date: Migraines No date: Neuromuscular disorder ([place]) No date: Seizures ([place])     Comment:  Last seizure 2014 No date: Sleep apnea No date: Trigeminal neuralgia  Past Surgical History: 2008: BREAST ENHANCEMENT SURGERY No date: BREAST SURGERY 06/13/2015: CESAREAN SECTION; N/A     Comment:  Procedure: CESAREAN SECTION;  Surgeon: Lavonia Drafts, MD;  Location: [facility];  Service:               Obstetrics;  Laterality: N/A; No date: COSMETIC SURGERY No date: wisdom teeth  BMI    Body Mass Index: 29.18 kg/m      Reproductive/Obstetrics negative OB ROS                             Anesthesia Physical Anesthesia Plan  ASA: II  Anesthesia Plan: General   Post-op Pain Management:    Induction:   PONV Risk Score and Plan: Propofol infusion and TIVA  Airway Management Planned: Natural Airway and Nasal Cannula  Additional Equipment:   Intra-op Plan:   Post-operative Plan:    Informed Consent: I have reviewed the patients History and Physical, chart, labs and discussed the procedure including the risks, benefits and alternatives for the proposed anesthesia with the patient or authorized representative who has indicated his/her understanding and acceptance.     Dental Advisory Given  Plan Discussed with: Anesthesiologist, CRNA and Surgeon  Anesthesia Plan Comments:         Anesthesia Quick Evaluation

## 2019-08-31 ENCOUNTER — Encounter: Payer: Self-pay | Admitting: *Deleted

## 2019-08-31 LAB — SURGICAL PATHOLOGY

## 2019-08-31 NOTE — Anesthesia Postprocedure Evaluation (Signed)
Anesthesia Post Note  Patient: Sharon King  Procedure(s) Performed: ESOPHAGOGASTRODUODENOSCOPY (EGD) WITH PROPOFOL (N/A )  Patient location during evaluation: PACU Anesthesia Type: General Level of consciousness: awake and alert Pain management: pain level controlled Vital Signs Assessment: post-procedure vital signs reviewed and stable Respiratory status: spontaneous breathing, nonlabored ventilation and respiratory function stable Cardiovascular status: blood pressure returned to baseline and stable Postop Assessment: no apparent nausea or vomiting Anesthetic complications: no     Last Vitals:  Vitals:   08/30/19 0853 08/30/19 0956  BP: (!) 141/94 115/75  Pulse: 78   Resp: 16   Temp: (!) 36.4 C (!) 36.1 C  SpO2: 100%     Last Pain:  Vitals:   08/30/19 1016  TempSrc:   PainSc: 0-No pain                 Tera Mater

## 2019-09-01 ENCOUNTER — Encounter: Payer: Self-pay | Admitting: Gastroenterology

## 2019-09-09 ENCOUNTER — Other Ambulatory Visit: Payer: Self-pay | Admitting: Gastroenterology

## 2019-09-09 ENCOUNTER — Other Ambulatory Visit: Payer: Self-pay | Admitting: Physician Assistant

## 2019-09-09 DIAGNOSIS — M62838 Other muscle spasm: Secondary | ICD-10-CM

## 2019-09-09 NOTE — Telephone Encounter (Signed)
Requested medication (s) are due for refill today: yes  Requested medication (s) are on the active medication list: yes  Last refill:  08/10/19  Future visit scheduled: no  Notes to clinic:  pt had CPE 2 months ago-med not delegated to NT to refill   Requested Prescriptions  Pending Prescriptions Disp Refills   tiZANidine (ZANAFLEX) 4 MG tablet [Pharmacy Med Name: TIZANIDINE HCL 4 MG TAB] 60 tablet 0    Sig: TAKE TWO TABLETS BY MOUTH AT BEDTIME      Not Delegated - Cardiovascular:  Alpha-2 Agonists - tizanidine Failed - 09/09/2019 12:44 PM      Failed - This refill cannot be delegated      Failed - Valid encounter within last 6 months    Recent Outpatient Visits           2 months ago Annual physical exam   [place], [place], PA-C   8 months ago Cough   [facility] [place], Dionne Bucy, MD   1 year ago Muscle spasm   [facility] Trinna Post, [place]   1 year ago Insomnia, unspecified type   [facility] [place], Wendee Beavers, [place]       Future Appointments             In 1 week Virgel Manifold, MD [place]

## 2019-09-12 NOTE — Telephone Encounter (Signed)
L.O.V. on 06/16/2019.

## 2019-09-18 ENCOUNTER — Other Ambulatory Visit: Payer: Self-pay

## 2019-09-18 ENCOUNTER — Encounter: Payer: Self-pay | Admitting: Gastroenterology

## 2019-09-18 ENCOUNTER — Ambulatory Visit (INDEPENDENT_AMBULATORY_CARE_PROVIDER_SITE_OTHER): Payer: Medicaid Other | Admitting: Gastroenterology

## 2019-09-18 VITALS — BP 120/78 | HR 90 | Temp 98.1°F | Wt 178.0 lb

## 2019-09-18 DIAGNOSIS — R109 Unspecified abdominal pain: Secondary | ICD-10-CM | POA: Diagnosis not present

## 2019-09-18 NOTE — Progress Notes (Signed)
[name], MD [address]  [place]  [place], [place] [address]  Main: [phone]  Fax: [phone]   Primary Care Physician: Trinna Post, PA-C   Chief Complaint  Patient presents with  . Follow-up    HPI: Sharon King is a 34 y.o. female here for follow-up of abdominal pain and heartburn.  Symptoms much better.  Abdominal pain is less frequent, left upper quadrant.  Heartburn has resolved.  No dysphagia.  Does report increased stressors due to some family issues.  Attributes some of her symptoms to this as well..  No suicidal ideation.  Current Outpatient Medications  Medication Sig Dispense Refill  . amitriptyline (ELAVIL) 50 MG tablet TAKE 1 TABLET BY MOUTH EVERYDAY AT BEDTIME 30 tablet 11  . norgestimate-ethinyl estradiol (ORTHO-CYCLEN) 0.25-35 MG-MCG tablet Take 1 tablet by mouth daily. Take active pills in a continuous fashion. Have a withdrawal bleed once every 3-6 months (Patient taking differently: Take 1 tablet by mouth at bedtime. Take active pills in a continuous fashion. Have a withdrawal bleed once every 3-6 months) 3 Package 1  . promethazine (PHENERGAN) 12.5 MG tablet TAKE ONE TABLET BY MOUTH EVERY 6 HOURS AS NEEDED FOR NAUSEA / VOMITING 30 tablet 0  . REYVOW 100 MG TABS Take 1 tablet by mouth once. May repeat in 2 hours    . SUMAtriptan (IMITREX) 100 MG tablet Take 1 tablet (100 mg total) by mouth once as needed for up to 1 dose for migraine. May repeat in 2 hours if headache persists or recurs. 9 tablet 11  . tiZANidine (ZANAFLEX) 4 MG tablet TAKE TWO TABLETS BY MOUTH AT BEDTIME 60 tablet 0  . topiramate (TOPAMAX) 25 MG tablet Take 3 tablets (75 mg total) by mouth daily. (Patient taking differently: Take 75 mg by mouth at bedtime. ) 90 tablet 5  . omeprazole (PRILOSEC) 20 MG capsule Take 1 capsule (20 mg total) by mouth in the morning and at bedtime. (Patient not taking: Reported on 09/18/2019) 60 capsule 0  . sucralfate (CARAFATE) 1 g  tablet Take 1 tablet (1 g total) by mouth 4 (four) times daily -  with meals and at bedtime. (Patient not taking: Reported on 09/18/2019) 120 tablet 2   No current facility-administered medications for this visit.    Allergies as of 09/18/2019 - Review Complete 09/18/2019  Allergen Reaction Noted  . Erythromycin Rash   . Paroxetine Palpitations   . Sulfonamide derivatives Rash     ROS:  General: Negative for anorexia, weight loss, fever, chills, fatigue, weakness. ENT: Negative for hoarseness, difficulty swallowing , nasal congestion. CV: Negative for chest pain, angina, palpitations, dyspnea on exertion, peripheral edema.  Respiratory: Negative for dyspnea at rest, dyspnea on exertion, cough, sputum, wheezing.  GI: See history of present illness. GU:  Negative for dysuria, hematuria, urinary incontinence, urinary frequency, nocturnal urination.  Endo: Negative for unusual weight change.    Physical Examination:   BP 120/78   Pulse 90   Temp 98.1 F (36.7 C) (Oral)   Wt 178 lb (80.7 kg)   BMI 30.55 kg/m   General: Well-nourished, well-developed in no acute distress.  Eyes: No icterus. Conjunctivae pink. Mouth: Oropharyngeal mucosa moist and pink , no lesions erythema or exudate. Neck: Supple, Trachea midline Abdomen: Bowel sounds are normal, nontender, nondistended, no hepatosplenomegaly or masses, no abdominal bruits or hernia , no rebound or guarding.   Extremities: No lower extremity edema. No clubbing or deformities. Neuro: Alert and oriented x  3.  Grossly intact. Skin: Warm and dry, no jaundice.   Psych: Alert and cooperative, normal mood and affect.   Labs: CMP     Component Value Date/Time   NA 143 08/03/2019 0501   NA 139 06/16/2019 0929   NA 140 10/24/2013 1650   K 4.5 08/03/2019 0501   K 3.5 10/24/2013 1650   CL 111 08/03/2019 0501   CL 109 (H) 10/24/2013 1650   CO2 22 08/03/2019 0501   CO2 24 10/24/2013 1650   GLUCOSE 96 08/03/2019 0501   GLUCOSE 106  (H) 10/24/2013 1650   BUN 7 08/03/2019 0501   BUN 9 06/16/2019 0929   BUN 9 10/24/2013 1650   CREATININE 0.74 08/03/2019 0501   CREATININE 0.63 10/24/2013 1650   CALCIUM 8.6 (L) 08/03/2019 0501   CALCIUM 8.8 10/24/2013 1650   PROT 7.3 08/02/2019 1106   PROT 6.9 06/16/2019 0929   PROT 7.2 10/24/2013 1650   ALBUMIN 3.4 (L) 08/02/2019 1106   ALBUMIN 3.9 06/16/2019 0929   ALBUMIN 3.5 10/24/2013 1650   AST 17 08/02/2019 1106   AST 8 (L) 10/24/2013 1650   ALT 15 08/02/2019 1106   ALT 10 (L) 10/24/2013 1650   ALKPHOS 81 08/02/2019 1106   ALKPHOS 57 10/24/2013 1650   BILITOT 0.4 08/02/2019 1106   BILITOT <0.2 06/16/2019 0929   BILITOT 0.4 10/24/2013 1650   GFRNONAA >60 08/03/2019 0501   GFRNONAA >60 10/24/2013 1650   GFRAA >60 08/03/2019 0501   GFRAA >60 10/24/2013 1650   Lab Results  Component Value Date   WBC 8.2 08/03/2019   HGB 12.2 08/03/2019   HCT 38.8 08/03/2019   MCV 91.5 08/03/2019   PLT 415 (H) 08/03/2019    Imaging Studies: No results found.  Assessment and Plan:   Sharon King is a 34 y.o. y/o female here for follow-up of abdominal pain and heartburn  Heartburn resolved Abdominal pain much better as well Continue and complete PPI for a total of 1 to 2 months and then stop  EGD reassuring  If symptoms recur, contact us  Patient encouraged to work on anxiety and stressors as much as possible as this may help with her GI symptoms as well.  She states she was previously on medications for this and will discuss reinstituting medications for this with her primary care provider.  (Risks of PPI use were discussed with patient including bone loss, C. Diff diarrhea, pneumonia, infections, CKD, electrolyte abnormalities.  Pt. Verbalizes understanding and chooses to continue the medication.)    Dr [name]

## 2019-09-20 ENCOUNTER — Other Ambulatory Visit: Payer: Self-pay | Admitting: *Deleted

## 2019-09-20 DIAGNOSIS — Z3041 Encounter for surveillance of contraceptive pills: Secondary | ICD-10-CM

## 2019-09-20 MED ORDER — NORGESTIMATE-ETH ESTRADIOL 0.25-35 MG-MCG PO TABS: 1.0000 | ORAL_TABLET | Freq: Every day | ORAL | 1 refills | Status: DC

## 2019-09-25 ENCOUNTER — Other Ambulatory Visit: Payer: Self-pay | Admitting: Gastroenterology

## 2019-11-01 ENCOUNTER — Telehealth: Payer: Self-pay

## 2019-11-01 NOTE — Telephone Encounter (Signed)
Patient called and left a voicemail to call her back. I then called her back and she did not answer and was not able to leave a voicemail since it is full. I will try to call her later on the day after clinic.

## 2019-11-02 MED ORDER — DICYCLOMINE HCL 10 MG PO CAPS
10.0000 mg | ORAL_CAPSULE | Freq: Three times a day (TID) | ORAL | 0 refills | Status: DC
Start: 2019-11-02 — End: 2022-01-20

## 2019-11-02 NOTE — Telephone Encounter (Signed)
Patient sent [place] a MyChart message. Please follow up from that message.

## 2019-11-03 MED ORDER — FAMOTIDINE 20 MG PO TABS
20.0000 mg | ORAL_TABLET | Freq: Every day | ORAL | 1 refills | Status: DC
Start: 2019-11-03 — End: 2019-12-26

## 2019-11-03 NOTE — Addendum Note (Signed)
Addended by: Ulyess Blossom L on: 11/03/2019 09:12 AM   Modules accepted: Orders

## 2019-11-21 ENCOUNTER — Other Ambulatory Visit: Payer: Self-pay | Admitting: Physician Assistant

## 2019-11-25 ENCOUNTER — Other Ambulatory Visit: Payer: Self-pay | Admitting: Physician Assistant

## 2019-11-25 DIAGNOSIS — M62838 Other muscle spasm: Secondary | ICD-10-CM

## 2019-11-25 NOTE — Telephone Encounter (Signed)
Requested medication (s) are due for refill today: yes  Requested medication (s) are on the active medication list: yes  Last refill:  09/12/19  Future visit scheduled: yes  Notes to clinic:  med not delegated to NT to RF   Requested Prescriptions  Pending Prescriptions Disp Refills   tiZANidine (ZANAFLEX) 4 MG tablet [Pharmacy Med Name: TIZANIDINE HCL 4 MG TAB] 60 tablet 0    Sig: TAKE TWO TABLETS BY MOUTH AT BEDTIME      Not Delegated - Cardiovascular:  Alpha-2 Agonists - tizanidine Failed - 11/25/2019  9:38 AM      Failed - This refill cannot be delegated      Passed - Valid encounter within last 6 months    Recent Outpatient Visits           5 months ago Annual physical exam   [facility] Car[place] Collet M, PA-C   10 months ago Cough   [facility] [place], Dionne Bucy, MD   1 year ago Muscle spasm   [place], [place], [place]   1 year ago Insomnia, unspecified type   [place], [place], [place]

## 2019-11-27 NOTE — Telephone Encounter (Signed)
Please review and advise if appropriate to refill, this patient was last seen to address muscle spasms and headache on 03/24/18, medication was las approved to refill on 09/12/19. KW

## 2019-11-30 NOTE — Telephone Encounter (Signed)
L.O.V. was on 06/16/2019 and medication send into pharmacy.

## 2019-12-14 ENCOUNTER — Other Ambulatory Visit: Payer: Self-pay

## 2019-12-14 ENCOUNTER — Ambulatory Visit
Admission: RE | Admit: 2019-12-14 | Discharge: 2019-12-14 | Disposition: A | Discharge status: Home / Self Care | Payer: Medicaid Other | Source: Ambulatory Visit | Attending: Emergency Medicine | Admitting: Emergency Medicine

## 2019-12-14 VITALS — BP 128/89 | HR 111 | Temp 98.9°F | Resp 18

## 2019-12-14 DIAGNOSIS — T7840XA Allergy, unspecified, initial encounter: Secondary | ICD-10-CM | POA: Diagnosis not present

## 2019-12-14 DIAGNOSIS — L509 Urticaria, unspecified: Secondary | ICD-10-CM | POA: Diagnosis not present

## 2019-12-14 MED ORDER — PREDNISONE 10 MG (21) PO TBPK
ORAL_TABLET | Freq: Every day | ORAL | 0 refills | Status: DC
Start: 2019-12-14 — End: 2019-12-14

## 2019-12-14 MED ORDER — METHYLPREDNISOLONE SODIUM SUCC 125 MG IJ SOLR: 125.0000 mg | Freq: Once | INTRAMUSCULAR | Status: DC

## 2019-12-14 MED ORDER — PREDNISONE 10 MG (21) PO TBPK
ORAL_TABLET | Freq: Every day | ORAL | 0 refills | Status: DC
Start: 2019-12-15 — End: 2019-12-19

## 2019-12-14 NOTE — ED Provider Notes (Signed)
Roderic Palau    CSN: [id number] Arrival date & time: 12/14/19  1615      History   Chief Complaint Chief Complaint  Patient presents with  . Allergic Reaction    HPI Sharon King is a 34 y.o. female.   Patient presents with periorbital swelling and hives since last night.  She attributes her symptoms to exposure to an egg substitute.  No difficulty swallowing or breathing.  No new products, foods, medications.  She has been taking Benadryl for her symptoms.  She denies fever, chills, sore throat, cough, shortness of breath, abdominal pain, vomiting, diarrhea, or other symptoms.  The history is provided by the patient.    Past Medical History:  Diagnosis Date  . Allergy   . Anxiety   . Asthma   . Cancer ([place])   . GERD (gastroesophageal reflux disease)   . History of preterm delivery   . Migraines   . Neuromuscular disorder ([place])   . Seizures ([place])    Last seizure 2014  . Sleep apnea   . Trigeminal neuralgia     Patient Active Problem List   Diagnosis Date Noted  . Abdominal pain   . Acute upper GI bleed 08/02/2019  . Chest pain due to GERD 08/02/2019  . Chronic migraine 06/23/2019  . Left shoulder pain 03/04/2018  . Muscle spasm 02/18/2016  . ALLERGIC RHINITIS 04/22/2007  . Asthma 11/06/2006  . Migraines 11/06/2006    Past Surgical History:  Procedure Laterality Date  . BREAST ENHANCEMENT SURGERY  2008  . BREAST SURGERY    . CESAREAN SECTION N/A 06/13/2015   Procedure: CESAREAN SECTION;  Surgeon: Lavonia Drafts, MD;  Location: [facility];  Service: Obstetrics;  Laterality: N/A;  . COSMETIC SURGERY    . ESOPHAGOGASTRODUODENOSCOPY (EGD) WITH PROPOFOL N/A 08/30/2019   Procedure: ESOPHAGOGASTRODUODENOSCOPY (EGD) WITH PROPOFOL;  Surgeon: Virgel Manifold, MD;  Location: [facility];  Service: Endoscopy;  Laterality: N/A;  . wisdom teeth      OB History    Gravida  1   Para  1   Term      Preterm  1   AB      Living  1      SAB      TAB      Ectopic      Multiple  0   Live Births  1            Home Medications    Prior to Admission medications   Medication Sig Start Date End Date Taking? Authorizing Provider  dicyclomine (BENTYL) 10 MG capsule Take 1 capsule (10 mg total) by mouth 3 (three) times daily before meals. 11/02/19  Yes Virgel Manifold, MD  norgestimate-ethinyl estradiol (ORTHO-CYCLEN) 0.25-35 MG-MCG tablet Take 1 tablet by mouth daily. Take active pills in a continuous fashion. Have a withdrawal bleed once every 3-6 months 09/20/19  Yes Anyanwu, Laray Anger A, MD  omeprazole (PRILOSEC) 20 MG capsule TAKE 1 CAPSULE BY MOUTH EVERY MORNING AND AT BEDTIME 09/26/19  Yes Virgel Manifold, MD  promethazine (PHENERGAN) 12.5 MG tablet TAKE ONE TABLET BY MOUTH EVERY 6 HOURS AS NEEDED FOR NAUSEA / VOMITING 08/04/19  Yes Teague Carlis Abbott, Collene Leyden, PA-C  REYVOW 100 MG TABS Take 1 tablet by mouth once. May repeat in 2 hours 07/05/19  Yes [provider]  SUMAtriptan (IMITREX) 100 MG tablet Take 1 tablet (100 mg total) by mouth once as needed for up to 1 dose for migraine.  May repeat in 2 hours if headache persists or recurs. 12/16/18  Yes Teague Bobbye Morton, PA-C  tiZANidine (ZANAFLEX) 4 MG tablet TAKE TWO TABLETS BY MOUTH AT BEDTIME 11/30/19  Yes Pollak, Adriana M, PA-C  topiramate (TOPAMAX) 25 MG tablet Take 3 tablets (75 mg total) by mouth daily. Patient taking differently: Take 75 mg by mouth at bedtime.  06/23/19  Yes Teague Bobbye Morton, PA-C  amitriptyline (ELAVIL) 50 MG tablet TAKE 1 TABLET BY MOUTH EVERYDAY AT BEDTIME 12/16/18   Jaclyn Prime, Collene Leyden, PA-C  famotidine (PEPCID) 20 MG tablet Take 1 tablet (20 mg total) by mouth at bedtime. 11/03/19   Virgel Manifold, MD  pantoprazole (PROTONIX) 40 MG tablet TAKE ONE TABLET TWICE DAILY 09/26/19   Virgel Manifold, MD  predniSONE (STERAPRED UNI-PAK 21 TAB) 10 MG (21) TBPK tablet Take by mouth daily. As directed 12/15/19   Sharion Balloon, NP    sucralfate (CARAFATE) 1 g tablet Take 1 tablet (1 g total) by mouth 4 (four) times daily -  with meals and at bedtime. Patient not taking: Reported on 09/18/2019 06/16/19 09/14/19  Trinna Post, PA-C    Family History Family History  Problem Relation Age of Onset  . Hyperlipidemia Mother   . Arthritis Mother   . Depression Mother   . Arthritis Father   . Depression Father   . Alcohol abuse Father     Social History Social History   Tobacco Use  . Smoking status: Former Research scientist (life sciences)  . Smokeless tobacco: Former Network engineer  . Vaping Use: Never used  Substance Use Topics  . Alcohol use: No    Comment: rare-stopped with pregnancy  . Drug use: No     Allergies   Erythromycin, Paroxetine, and Sulfonamide derivatives   Review of Systems Review of Systems  Constitutional: Negative for chills and fever.  HENT: Positive for facial swelling. Negative for ear pain, sore throat and trouble swallowing.   Eyes: Negative for pain and visual disturbance.  Respiratory: Negative for cough and shortness of breath.   Cardiovascular: Negative for chest pain and palpitations.  Gastrointestinal: Negative for abdominal pain and vomiting.  Genitourinary: Negative for dysuria and hematuria.  Musculoskeletal: Negative for arthralgias and back pain.  Skin: Positive for rash. Negative for color change.  Neurological: Negative for seizures and syncope.  All other systems reviewed and are negative.    Physical Exam Triage Vital Signs ED Triage Vitals  Enc Vitals Group     BP      Pulse      Resp      Temp      Temp src      SpO2      Weight      Height      Head Circumference      Peak Flow      Pain Score      Pain Loc      Pain Edu?      Excl. in [place]?    No data found.  Updated Vital Signs BP 128/89 (BP Location: Left Arm)   Pulse (!) 111   Temp 98.9 F (37.2 C) (Oral)   Resp 18   SpO2 98%   Visual Acuity Right Eye Distance:   Left Eye Distance:   Bilateral  Distance:    Right Eye Near:   Left Eye Near:    Bilateral Near:     Physical Exam Vitals and nursing note reviewed.  Constitutional:      General: She is not in acute distress.    Appearance: She is well-developed. She is not ill-appearing.  HENT:     Head: Normocephalic and atraumatic.     Right Ear: Tympanic membrane normal.     Left Ear: Tympanic membrane normal.     Nose: Nose normal.     Mouth/Throat:     Mouth: Mucous membranes are moist.     Pharynx: Oropharynx is clear.     Comments: Speech clear.  No oropharyngeal swelling.  No difficulty swallowing. Eyes:     Conjunctiva/sclera: Conjunctivae normal.  Cardiovascular:     Rate and Rhythm: Normal rate and regular rhythm.     Heart sounds: No murmur heard.   Pulmonary:     Effort: Pulmonary effort is normal. No respiratory distress.     Breath sounds: Normal breath sounds. No stridor. No wheezing or rhonchi.  Abdominal:     Palpations: Abdomen is soft.     Tenderness: There is no abdominal tenderness.  Musculoskeletal:     Cervical back: Neck supple.  Skin:    General: Skin is warm and dry.     Findings: Rash present.     Comments: Urticarial rash on trunk and extremities.  Neurological:     General: No focal deficit present.     Mental Status: She is alert and oriented to person, place, and time.     Gait: Gait normal.  Psychiatric:        Mood and Affect: Mood normal.        Behavior: Behavior normal.      UC Treatments / Results  Labs (all labs ordered are listed, but only abnormal results are displayed) Labs Reviewed - No data to display  EKG   Radiology No results found.  Procedures Procedures (including critical care time)  Medications Ordered in UC Medications  methylPREDNISolone sodium succinate (SOLU-MEDROL) 125 mg/2 mL injection 125 mg (has no administration in time range)    Initial Impression / Assessment and Plan / UC Course  I have reviewed the triage vital signs and the  nursing notes.  Pertinent labs & imaging results that were available during my care of the patient were reviewed by me and considered in my medical decision making (see chart for details).   Hives, allergic reaction.  Solu-Medrol given here.  Starting prednisone taper tomorrow.  Instructed patient to take Benadryl every 6 hours.  Precautions for drowsiness with Benadryl discussed.  Instructed patient to call 911 or go to the ED if she has difficulty swallowing or breathing.  Instructed her to follow-up with her PCP tomorrow.  Patient agrees to plan of care.     Final Clinical Impressions(s) / UC Diagnoses   Final diagnoses:  Allergic reaction, initial encounter  Hives     Discharge Instructions     You were given an injection of Solu-Medrol here. Start the prednisone tomorrow.    Take Benadryl every 6 hours.  Do not drive, operate machinery, or drink alcohol with this medication as it may cause drowsiness.    Call 911 or go to the emergency department if you have difficulty swallowing or breathing.    Follow-up with your primary care provider tomorrow.        ED Prescriptions    Medication Sig Dispense Auth. Provider   predniSONE (STERAPRED UNI-PAK 21 TAB) 10 MG (21) TBPK tablet  (Status: Discontinued) Take by mouth daily. As directed 21 tablet Sharion Balloon, NP  predniSONE (STERAPRED UNI-PAK 21 TAB) 10 MG (21) TBPK tablet Take by mouth daily. As directed 21 tablet Sharion Balloon, NP     PDMP not reviewed this encounter.   Sharion Balloon, NP 12/14/19 [id number]

## 2019-12-14 NOTE — ED Triage Notes (Signed)
Pt reports waking up with both eyes swollen yesterday morning.  Right eye has gone down somewhat but left eye noticeably swollen down into cheek. Hives started appearing last night in groin areas and are now appearing on torso, arms, legs.  Denies any issues with breathing or throat closing.   Did have egg substitute spilled on face/down body the day before at work and noticed small rash on arm that went away with benadryl.   No other contact with new chemicals - laundry detergent, no new sheets/clothes, etc.    Has taken Benadryl throughout the day, latest dose 50mg  at 1200.

## 2019-12-14 NOTE — Discharge Instructions (Addendum)
You were given an injection of Solu-Medrol here. Start the prednisone tomorrow.    Take Benadryl every 6 hours.  Do not drive, operate machinery, or drink alcohol with this medication as it may cause drowsiness.    Call 911 or go to the emergency department if you have difficulty swallowing or breathing.    Follow-up with your primary care provider tomorrow.

## 2019-12-19 ENCOUNTER — Ambulatory Visit
Admission: RE | Admit: 2019-12-19 | Discharge: 2019-12-19 | Disposition: A | Discharge status: Home / Self Care | Payer: Medicaid Other | Source: Ambulatory Visit | Attending: Emergency Medicine | Admitting: Emergency Medicine

## 2019-12-19 ENCOUNTER — Other Ambulatory Visit: Payer: Self-pay

## 2019-12-19 VITALS — BP 135/94 | HR 105 | Temp 98.9°F | Resp 17 | Ht 64.0 in | Wt 176.4 lb

## 2019-12-19 DIAGNOSIS — L500 Allergic urticaria: Secondary | ICD-10-CM | POA: Diagnosis not present

## 2019-12-19 MED ORDER — DEXAMETHASONE SODIUM PHOSPHATE 10 MG/ML IJ SOLN
10.0000 mg | Freq: Once | INTRAMUSCULAR | Status: AC
  Administered 2019-12-19: 10 mg via INTRAMUSCULAR

## 2019-12-19 MED ORDER — CETIRIZINE HCL 10 MG PO TABS: 10.0000 mg | ORAL_TABLET | Freq: Every day | ORAL | 0 refills | Status: DC

## 2019-12-19 MED ORDER — PREDNISONE 10 MG (21) PO TBPK
ORAL_TABLET | ORAL | 0 refills | Status: DC
Start: 2019-12-19 — End: 2020-04-18

## 2019-12-19 NOTE — ED Provider Notes (Signed)
[facility]   [id number] 12/19/19 Arrival Time: 4098   Chief Complaint  Patient presents with  . Urticaria     SUBJECTIVE: History from: patient.  Sharon King is a 34 y.o. female who presented to the urgent care for complaint of rash for the past few days.  She denies changes in soaps, detergents, or anyone with similar symptoms.  She localizes the rash to her face, abdomen, arm, lower extremities.  She describes it as red and itchy.  Sharon King has been taking Benadryl and steroid.  Her symptoms are made worse by wearing the same shirt.  She reports similar symptoms in the past.  Denies chills, fever, nausea, vomiting, diarrhea.  ROS: As per HPI.  All other pertinent ROS negative.     Past Medical History:  Diagnosis Date  . Allergy   . Anxiety   . Asthma   . Cancer ([place])   . GERD (gastroesophageal reflux disease)   . History of preterm delivery   . Migraines   . Neuromuscular disorder ([place])   . Seizures ([place])    Last seizure 2014  . Sleep apnea   . Trigeminal neuralgia    Past Surgical History:  Procedure Laterality Date  . BREAST ENHANCEMENT SURGERY  2008  . BREAST SURGERY    . CESAREAN SECTION N/A 06/13/2015   Procedure: CESAREAN SECTION;  Surgeon: Lavonia Drafts, MD;  Location: [facility];  Service: Obstetrics;  Laterality: N/A;  . COSMETIC SURGERY    . ESOPHAGOGASTRODUODENOSCOPY (EGD) WITH PROPOFOL N/A 08/30/2019   Procedure: ESOPHAGOGASTRODUODENOSCOPY (EGD) WITH PROPOFOL;  Surgeon: Virgel Manifold, MD;  Location: [facility];  Service: Endoscopy;  Laterality: N/A;  . wisdom teeth     Allergies  Allergen Reactions  . Erythromycin Rash  . Paroxetine Palpitations  . Sulfonamide Derivatives Rash   No current facility-administered medications on file prior to encounter.   Current Outpatient Medications on File Prior to Encounter  Medication Sig Dispense Refill  . amitriptyline (ELAVIL) 50 MG tablet TAKE 1 TABLET BY MOUTH EVERYDAY AT  BEDTIME 30 tablet 11  . dicyclomine (BENTYL) 10 MG capsule Take 1 capsule (10 mg total) by mouth 3 (three) times daily before meals. 90 capsule 0  . famotidine (PEPCID) 20 MG tablet Take 1 tablet (20 mg total) by mouth at bedtime. 30 tablet 1  . norgestimate-ethinyl estradiol (ORTHO-CYCLEN) 0.25-35 MG-MCG tablet Take 1 tablet by mouth daily. Take active pills in a continuous fashion. Have a withdrawal bleed once every 3-6 months 3 Package 1  . omeprazole (PRILOSEC) 20 MG capsule TAKE 1 CAPSULE BY MOUTH EVERY MORNING AND AT BEDTIME 60 capsule 2  . pantoprazole (PROTONIX) 40 MG tablet TAKE ONE TABLET TWICE DAILY 60 tablet 2  . promethazine (PHENERGAN) 12.5 MG tablet TAKE ONE TABLET BY MOUTH EVERY 6 HOURS AS NEEDED FOR NAUSEA / VOMITING 30 tablet 0  . REYVOW 100 MG TABS Take 1 tablet by mouth once. May repeat in 2 hours    . sucralfate (CARAFATE) 1 g tablet Take 1 tablet (1 g total) by mouth 4 (four) times daily -  with meals and at bedtime. (Patient not taking: Reported on 09/18/2019) 120 tablet 2  . SUMAtriptan (IMITREX) 100 MG tablet Take 1 tablet (100 mg total) by mouth once as needed for up to 1 dose for migraine. May repeat in 2 hours if headache persists or recurs. 9 tablet 11  . tiZANidine (ZANAFLEX) 4 MG tablet TAKE TWO TABLETS BY MOUTH AT BEDTIME 60 tablet 0  .  topiramate (TOPAMAX) 25 MG tablet Take 3 tablets (75 mg total) by mouth daily. (Patient taking differently: Take 75 mg by mouth at bedtime. ) 90 tablet 5   Social History   Socioeconomic History  . Marital status: Legally Separated    Spouse name: Not on file  . Number of children: Not on file  . Years of education: Not on file  . Highest education level: Not on file  Occupational History  . Not on file  Tobacco Use  . Smoking status: Former Research scientist (life sciences)  . Smokeless tobacco: Former Network engineer  . Vaping Use: Never used  Substance and Sexual Activity  . Alcohol use: No    Comment: rare-stopped with pregnancy  . Drug use: No    . Sexual activity: Not Currently    Birth control/protection: Pill  Other Topics Concern  . Not on file  Social History Narrative  . Not on file   Social Determinants of Health   Financial Resource Strain:   . Difficulty of Paying Living Expenses: Not on file  Food Insecurity:   . Worried About Charity fundraiser in the Last Year: Not on file  . Ran Out of Food in the Last Year: Not on file  Transportation Needs:   . Lack of Transportation (Medical): Not on file  . Lack of Transportation (Non-Medical): Not on file  Physical Activity:   . Days of Exercise per Week: Not on file  . Minutes of Exercise per Session: Not on file  Stress:   . Feeling of Stress : Not on file  Social Connections:   . Frequency of Communication with Friends and Family: Not on file  . Frequency of Social Gatherings with Friends and Family: Not on file  . Attends Religious Services: Not on file  . Active Member of Clubs or Organizations: Not on file  . Attends Archivist Meetings: Not on file  . Marital Status: Not on file  Intimate Partner Violence:   . Fear of Current or Ex-Partner: Not on file  . Emotionally Abused: Not on file  . Physically Abused: Not on file  . Sexually Abused: Not on file   Family History  Problem Relation Age of Onset  . Hyperlipidemia Mother   . Arthritis Mother   . Depression Mother   . Arthritis Father   . Depression Father   . Alcohol abuse Father     OBJECTIVE:  Vitals:   12/19/19 1024 12/19/19 1027  BP:  (!) 135/94  Pulse:  (!) 105  Resp:  17  Temp:  98.9 F (37.2 C)  TempSrc:  Oral  SpO2:  97%  Weight: 176 lb 5.9 oz (80 kg)   Height: 5\' 4"  (1.626 m)      Physical Exam Vitals and nursing note reviewed.  Constitutional:      General: She is not in acute distress.    Appearance: Normal appearance. She is normal weight. She is not ill-appearing, toxic-appearing or diaphoretic.  HENT:     Head: Normocephalic.  Cardiovascular:     Rate and  Rhythm: Normal rate and regular rhythm.     Pulses: Normal pulses.     Heart sounds: Normal heart sounds. No murmur heard.  No friction rub. No gallop.   Pulmonary:     Effort: Pulmonary effort is normal. No respiratory distress.     Breath sounds: Normal breath sounds. No stridor. No wheezing, rhonchi or rales.  Chest:     Chest wall:  No tenderness.  Skin:    Findings: Rash present. Rash is macular and urticarial.  Neurological:     Mental Status: She is alert and oriented to person, place, and time.     LABS:   No results found for this or any previous visit (from the past 24 hour(s)).   ASSESSMENT & PLAN:  No diagnosis found.  Meds ordered this encounter  Medications  . cetirizine (ZYRTEC ALLERGY) 10 MG tablet    Sig: Take 1 tablet (10 mg total) by mouth daily.    Dispense:  30 tablet    Refill:  0  . predniSONE (STERAPRED UNI-PAK 21 TAB) 10 MG (21) TBPK tablet    Sig: Take 6 tabs by mouth daily  for 1 days, then 5 tabs for 1 days, then 4 tabs for 1 days, then 3 tabs for 1 days, 2 tabs for 1 days, then 1 tab by mouth daily for 1 days    Dispense:  21 tablet    Refill:  0    Discharge instructions  Prescribed zyrtec and triamcinolone cream Take as prescribed and to completion Limit hot shower and baths, or bathe with warm water.   Moisturize skin daily Follow up with PCP if symptoms persists Return or go to the ER if you have any new or worsening symptoms  Reviewed expectations re: course of current medical issues. Questions answered. Outlined signs and symptoms indicating need for more acute intervention. Patient verbalized understanding. After Visit Summary given.      Note: This document was prepared using Dragon voice recognition software and may include unintentional dictation errors.    Emerson Monte, FNP 12/19/19 1054

## 2019-12-19 NOTE — ED Triage Notes (Addendum)
Lip swelling and hives on arms and under axilla. Pt had egg substitute spilled on her last week and had a reaction then.  She is wearing the same shirt but it has been washed. Pt is on steroids and today is her last day. She has been taking benadryl around the clock.  States she has not taken her oral steroids today.

## 2019-12-19 NOTE — Discharge Instructions (Signed)
Prescribed zyrtec andprednisone Take as prescribed and to completion Limit hot shower and baths, or bathe with warm water.   Moisturize skin daily Follow up with PCP if symptoms persists Return or go to the ER if you have any new or worsening symptoms 

## 2019-12-22 ENCOUNTER — Ambulatory Visit
Admission: EM | Admit: 2019-12-22 | Discharge: 2019-12-22 | Disposition: A | Discharge status: Home / Self Care | Payer: Medicaid Other

## 2019-12-22 ENCOUNTER — Other Ambulatory Visit: Payer: Self-pay

## 2019-12-22 ENCOUNTER — Ambulatory Visit (INDEPENDENT_AMBULATORY_CARE_PROVIDER_SITE_OTHER): Payer: Medicaid Other | Admitting: Physician Assistant

## 2019-12-22 ENCOUNTER — Encounter: Payer: Self-pay | Admitting: Physician Assistant

## 2019-12-22 VITALS — BP 137/83 | HR 101 | Wt 168.0 lb

## 2019-12-22 DIAGNOSIS — L509 Urticaria, unspecified: Secondary | ICD-10-CM | POA: Diagnosis not present

## 2019-12-22 DIAGNOSIS — G43709 Chronic migraine without aura, not intractable, without status migrainosus: Secondary | ICD-10-CM | POA: Diagnosis not present

## 2019-12-22 DIAGNOSIS — M25562 Pain in left knee: Secondary | ICD-10-CM

## 2019-12-22 DIAGNOSIS — IMO0002 Reserved for concepts with insufficient information to code with codable children: Secondary | ICD-10-CM

## 2019-12-22 DIAGNOSIS — M25561 Pain in right knee: Secondary | ICD-10-CM

## 2019-12-22 DIAGNOSIS — G43009 Migraine without aura, not intractable, without status migrainosus: Secondary | ICD-10-CM

## 2019-12-22 MED ORDER — TRIAMCINOLONE ACETONIDE 0.1 % EX CREA: 1.0000 "application " | TOPICAL_CREAM | Freq: Two times a day (BID) | CUTANEOUS | 0 refills | Status: DC

## 2019-12-22 MED ORDER — HYDROXYZINE HCL 25 MG PO TABS
25.0000 mg | ORAL_TABLET | Freq: Four times a day (QID) | ORAL | 0 refills | Status: DC | PRN
Start: 2019-12-22 — End: 2019-12-26

## 2019-12-22 MED ORDER — TOPIRAMATE 100 MG PO TABS
100.0000 mg | ORAL_TABLET | Freq: Two times a day (BID) | ORAL | 3 refills | Status: DC
Start: 2019-12-22 — End: 2020-03-29

## 2019-12-22 MED ORDER — UBRELVY 100 MG PO TABS: 100.0000 mg | ORAL_TABLET | ORAL | 5 refills | Status: DC | PRN

## 2019-12-22 NOTE — ED Triage Notes (Signed)
Patient here today for bilateral knee pain that started yesterday. Patient reports she believes it to be due to the prednisone.

## 2019-12-22 NOTE — ED Provider Notes (Signed)
Roderic Palau    CSN: [id number] Arrival date & time: 12/22/19  6387      History   Chief Complaint Chief Complaint  Patient presents with  . Knee Pain    HPI Rey Fors is a 34 y.o. female.   Patient presents for 1 day history of bilateral knee pain.  No falls or injury.  Patient attributes this pain to her recent steroid use but states she has not missed any doses and is taking it as directed.  She denies fever, chills, lesions, rash, erythema, swelling, warmth, bruising, or other symptoms.  Patient was seen here on 12/14/2019; diagnosed with hives and allergic reaction; treated with Solu-Medrol and prednisone taper.  Patient was seen at [facility] urgent care on 12/19/2019; diagnosed with allergic urticaria; treated with prednisone taper.  The history is provided by the patient.    Past Medical History:  Diagnosis Date  . Allergy   . Anxiety   . Asthma   . Cancer ([place])   . GERD (gastroesophageal reflux disease)   . History of preterm delivery   . Migraines   . Neuromuscular disorder ([place])   . Seizures ([place])    Last seizure 2014  . Sleep apnea   . Trigeminal neuralgia     Patient Active Problem List   Diagnosis Date Noted  . Urticaria 12/22/2019  . Abdominal pain   . Acute upper GI bleed 08/02/2019  . Chest pain due to GERD 08/02/2019  . Chronic migraine 06/23/2019  . Left shoulder pain 03/04/2018  . Muscle spasm 02/18/2016  . ALLERGIC RHINITIS 04/22/2007  . Asthma 11/06/2006  . Migraines 11/06/2006    Past Surgical History:  Procedure Laterality Date  . BREAST ENHANCEMENT SURGERY  2008  . BREAST SURGERY    . CESAREAN SECTION N/A 06/13/2015   Procedure: CESAREAN SECTION;  Surgeon: Lavonia Drafts, MD;  Location: [facility];  Service: Obstetrics;  Laterality: N/A;  . COSMETIC SURGERY    . ESOPHAGOGASTRODUODENOSCOPY (EGD) WITH PROPOFOL N/A 08/30/2019   Procedure: ESOPHAGOGASTRODUODENOSCOPY (EGD) WITH PROPOFOL;  Surgeon: Virgel Manifold,  MD;  Location: [facility]COPY;  Service: Endoscopy;  Laterality: N/A;  . wisdom teeth      OB History    Gravida  1   Para  1   Term      Preterm  1   AB      Living  1     SAB      TAB      Ectopic      Multiple  0   Live Births  1            Home Medications    Prior to Admission medications   Medication Sig Start Date End Date Taking? Authorizing Provider  amitriptyline (ELAVIL) 50 MG tablet TAKE 1 TABLET BY MOUTH EVERYDAY AT BEDTIME 12/16/18   Jaclyn Prime, Collene Leyden, PA-C  cetirizine (ZYRTEC ALLERGY) 10 MG tablet Take 1 tablet (10 mg total) by mouth daily. 12/19/19   Avegno, Darrelyn Hillock, FNP  dicyclomine (BENTYL) 10 MG capsule Take 1 capsule (10 mg total) by mouth 3 (three) times daily before meals. 11/02/19   Virgel Manifold, MD  famotidine (PEPCID) 20 MG tablet Take 1 tablet (20 mg total) by mouth at bedtime. 11/03/19   Virgel Manifold, MD  hydrOXYzine (ATARAX/VISTARIL) 25 MG tablet Take 1 tablet (25 mg total) by mouth every 6 (six) hours as needed for itching. 12/22/19   Paticia Stack, PA-C  norgestimate-ethinyl estradiol (ORTHO-CYCLEN) 0.25-35 MG-MCG tablet Take 1 tablet by mouth daily. Take active pills in a continuous fashion. Have a withdrawal bleed once every 3-6 months 09/20/19   Anyanwu, Sallyanne Havers, MD  omeprazole (PRILOSEC) 20 MG capsule TAKE 1 CAPSULE BY MOUTH EVERY MORNING AND AT BEDTIME 09/26/19   Virgel Manifold, MD  pantoprazole (PROTONIX) 40 MG tablet TAKE ONE TABLET TWICE DAILY 09/26/19   Virgel Manifold, MD  predniSONE (STERAPRED UNI-PAK 21 TAB) 10 MG (21) TBPK tablet Take 6 tabs by mouth daily  for 1 days, then 5 tabs for 1 days, then 4 tabs for 1 days, then 3 tabs for 1 days, 2 tabs for 1 days, then 1 tab by mouth daily for 1 days 12/19/19   Emerson Monte, FNP  promethazine (PHENERGAN) 12.5 MG tablet TAKE ONE TABLET BY MOUTH EVERY 6 HOURS AS NEEDED FOR NAUSEA / VOMITING 08/04/19   Jaclyn Prime, Collene Leyden, PA-C  REYVOW 100 MG TABS  Take 1 tablet by mouth once. May repeat in 2 hours 07/05/19   [provider]  sucralfate (CARAFATE) 1 g tablet Take 1 tablet (1 g total) by mouth 4 (four) times daily -  with meals and at bedtime. Patient not taking: Reported on 09/18/2019 06/16/19 09/14/19  Trinna Post, PA-C  SUMAtriptan (IMITREX) 100 MG tablet Take 1 tablet (100 mg total) by mouth once as needed for up to 1 dose for migraine. May repeat in 2 hours if headache persists or recurs. 12/16/18   Jaclyn Prime, Collene Leyden, PA-C  tiZANidine (ZANAFLEX) 4 MG tablet TAKE TWO TABLETS BY MOUTH AT BEDTIME 11/30/19   Trinna Post, PA-C  topiramate (TOPAMAX) 100 MG tablet Take 1 tablet (100 mg total) by mouth 2 (two) times daily. 12/22/19   Teague Carlis Abbott, Collene Leyden, PA-C  topiramate (TOPAMAX) 25 MG tablet Take 3 tablets (75 mg total) by mouth daily. Patient taking differently: Take 75 mg by mouth at bedtime.  06/23/19   Jaclyn Prime, Collene Leyden, PA-C  triamcinolone cream (KENALOG) 0.1 % Apply 1 application topically 2 (two) times daily. 12/22/19   Teague Carlis Abbott, Collene Leyden, PA-C  Ubrogepant (UBRELVY) 100 MG TABS Take 100 mg by mouth as needed. 12/22/19   Paticia Stack, PA-C    Family History Family History  Problem Relation Age of Onset  . Hyperlipidemia Mother   . Arthritis Mother   . Depression Mother   . Arthritis Father   . Depression Father   . Alcohol abuse Father     Social History Social History   Tobacco Use  . Smoking status: Former Research scientist (life sciences)  . Smokeless tobacco: Former Network engineer  . Vaping Use: Never used  Substance Use Topics  . Alcohol use: No    Comment: rare-stopped with pregnancy  . Drug use: No     Allergies   Eggs or egg-derived products, Erythromycin, Paroxetine, and Sulfonamide derivatives   Review of Systems Review of Systems  Constitutional: Negative for chills and fever.  HENT: Negative for ear pain and sore throat.   Eyes: Negative for pain and visual disturbance.  Respiratory: Negative  for cough and shortness of breath.   Cardiovascular: Negative for chest pain and palpitations.  Gastrointestinal: Negative for abdominal pain and vomiting.  Genitourinary: Negative for dysuria and hematuria.  Musculoskeletal: Positive for arthralgias. Negative for back pain.  Skin: Negative for color change and rash.  Neurological: Negative for seizures, syncope, weakness and numbness.  All other systems reviewed  and are negative.    Physical Exam Triage Vital Signs ED Triage Vitals  Enc Vitals Group     BP      Pulse      Resp      Temp      Temp src      SpO2      Weight      Height      Head Circumference      Peak Flow      Pain Score      Pain Loc      Pain Edu?      Excl. in [place]?    No data found.  Updated Vital Signs BP (!) 130/92   Pulse 96   Temp 99 F (37.2 C)   Resp 16   LMP 12/22/2019 (Exact Date)   SpO2 97%   Visual Acuity Right Eye Distance:   Left Eye Distance:   Bilateral Distance:    Right Eye Near:   Left Eye Near:    Bilateral Near:     Physical Exam Vitals and nursing note reviewed.  Constitutional:      General: She is not in acute distress.    Appearance: She is well-developed. She is not ill-appearing.  HENT:     Head: Normocephalic and atraumatic.     Mouth/Throat:     Mouth: Mucous membranes are moist.  Eyes:     Conjunctiva/sclera: Conjunctivae normal.  Cardiovascular:     Rate and Rhythm: Normal rate and regular rhythm.     Heart sounds: No murmur heard.   Pulmonary:     Effort: Pulmonary effort is normal. No respiratory distress.     Breath sounds: Normal breath sounds.  Abdominal:     Palpations: Abdomen is soft.     Tenderness: There is no abdominal tenderness.  Musculoskeletal:        General: No swelling, deformity or signs of injury. Normal range of motion.     Cervical back: Neck supple.  Skin:    General: Skin is warm and dry.     Capillary Refill: Capillary refill takes less than 2 seconds.     Findings:  No bruising, erythema, lesion or rash.  Neurological:     General: No focal deficit present.     Mental Status: She is alert and oriented to person, place, and time.     Sensory: No sensory deficit.     Motor: No weakness.     Coordination: Coordination normal.     Gait: Gait normal.  Psychiatric:        Mood and Affect: Mood normal.        Behavior: Behavior normal.      UC Treatments / Results  Labs (all labs ordered are listed, but only abnormal results are displayed) Labs Reviewed - No data to display  EKG   Radiology No results found.  Procedures Procedures (including critical care time)  Medications Ordered in UC Medications - No data to display  Initial Impression / Assessment and Plan / UC Course  I have reviewed the triage vital signs and the nursing notes.  Pertinent labs & imaging results that were available during my care of the patient were reviewed by me and considered in my medical decision making (see chart for details).   Acute bilateral knee pain.  Instructed patient to take Tylenol as needed for discomfort.  Instructed her to go to the ED if she has acute worsening symptoms and to follow-up  with her PCP next week.  Patient agrees to plan of care.  Final Clinical Impressions(s) / UC Diagnoses   Final diagnoses:  Acute pain of both knees     Discharge Instructions     Take Tylenol as needed for discomfort.    Go to the Emergency Department if you have acute worsening symptoms.    Call your primary care provider to schedule the soonest available appointment.      ED Prescriptions    None     PDMP not reviewed this encounter.   Sharion Balloon, NP 12/22/19 1022

## 2019-12-22 NOTE — Progress Notes (Signed)
History:  Sharon King is a 34 y.o. G1P0101 who presents to clinic today for 6 month HA followup.  She has been using topamax at 75mg  daily and believes it is helpful.  She thinks she will need to increase the dose.  She discontinued amitriptyline as she did did not want to be on both medications.  She tried Reyvow and found it works well within 30-40 minutes.  She also states ubrelvy samples were helpful and she requests rx for that.   Over a week ago she developed a rash, she believes it is a reaction to a preservative in an egg mixture used at work.  She has since been using lots of prednisone and benadryl.  The rash is minimally improved and the prednisone is causing pain throughout her body.  She is very uncomfortable and could not sleep as a result.   Past Medical History:  Diagnosis Date  . Allergy   . Anxiety   . Asthma   . Cancer ([place])   . GERD (gastroesophageal reflux disease)   . History of preterm delivery   . Migraines   . Neuromuscular disorder ([place])   . Seizures ([place])    Last seizure 2014  . Sleep apnea   . Trigeminal neuralgia     Social History   Socioeconomic History  . Marital status: Legally Separated    Spouse name: Not on file  . Number of children: Not on file  . Years of education: Not on file  . Highest education level: Not on file  Occupational History  . Not on file  Tobacco Use  . Smoking status: Former Research scientist (life sciences)  . Smokeless tobacco: Former Network engineer  . Vaping Use: Never used  Substance and Sexual Activity  . Alcohol use: No    Comment: rare-stopped with pregnancy  . Drug use: No  . Sexual activity: Not Currently    Birth control/protection: Pill  Other Topics Concern  . Not on file  Social History Narrative  . Not on file   Social Determinants of Health   Financial Resource Strain:   . Difficulty of Paying Living Expenses: Not on file  Food Insecurity:   . Worried About Charity fundraiser in the Last Year: Not on file  .  Ran Out of Food in the Last Year: Not on file  Transportation Needs:   . Lack of Transportation (Medical): Not on file  . Lack of Transportation (Non-Medical): Not on file  Physical Activity:   . Days of Exercise per Week: Not on file  . Minutes of Exercise per Session: Not on file  Stress:   . Feeling of Stress : Not on file  Social Connections:   . Frequency of Communication with Friends and Family: Not on file  . Frequency of Social Gatherings with Friends and Family: Not on file  . Attends Religious Services: Not on file  . Active Member of Clubs or Organizations: Not on file  . Attends Archivist Meetings: Not on file  . Marital Status: Not on file  Intimate Partner Violence:   . Fear of Current or Ex-Partner: Not on file  . Emotionally Abused: Not on file  . Physically Abused: Not on file  . Sexually Abused: Not on file    Family History  Problem Relation Age of Onset  . Hyperlipidemia Mother   . Arthritis Mother   . Depression Mother   . Arthritis Father   . Depression Father   .  Alcohol abuse Father     Allergies  Allergen Reactions  . Eggs Or Egg-Derived Products Rash  . Erythromycin Rash  . Paroxetine Palpitations  . Sulfonamide Derivatives Rash    Current Outpatient Medications on File Prior to Visit  Medication Sig Dispense Refill  . amitriptyline (ELAVIL) 50 MG tablet TAKE 1 TABLET BY MOUTH EVERYDAY AT BEDTIME 30 tablet 11  . cetirizine (ZYRTEC ALLERGY) 10 MG tablet Take 1 tablet (10 mg total) by mouth daily. 30 tablet 0  . dicyclomine (BENTYL) 10 MG capsule Take 1 capsule (10 mg total) by mouth 3 (three) times daily before meals. 90 capsule 0  . famotidine (PEPCID) 20 MG tablet Take 1 tablet (20 mg total) by mouth at bedtime. 30 tablet 1  . norgestimate-ethinyl estradiol (ORTHO-CYCLEN) 0.25-35 MG-MCG tablet Take 1 tablet by mouth daily. Take active pills in a continuous fashion. Have a withdrawal bleed once every 3-6 months 3 Package 1  .  omeprazole (PRILOSEC) 20 MG capsule TAKE 1 CAPSULE BY MOUTH EVERY MORNING AND AT BEDTIME 60 capsule 2  . pantoprazole (PROTONIX) 40 MG tablet TAKE ONE TABLET TWICE DAILY 60 tablet 2  . predniSONE (STERAPRED UNI-PAK 21 TAB) 10 MG (21) TBPK tablet Take 6 tabs by mouth daily  for 1 days, then 5 tabs for 1 days, then 4 tabs for 1 days, then 3 tabs for 1 days, 2 tabs for 1 days, then 1 tab by mouth daily for 1 days 21 tablet 0  . promethazine (PHENERGAN) 12.5 MG tablet TAKE ONE TABLET BY MOUTH EVERY 6 HOURS AS NEEDED FOR NAUSEA / VOMITING 30 tablet 0  . REYVOW 100 MG TABS Take 1 tablet by mouth once. May repeat in 2 hours    . SUMAtriptan (IMITREX) 100 MG tablet Take 1 tablet (100 mg total) by mouth once as needed for up to 1 dose for migraine. May repeat in 2 hours if headache persists or recurs. 9 tablet 11  . tiZANidine (ZANAFLEX) 4 MG tablet TAKE TWO TABLETS BY MOUTH AT BEDTIME 60 tablet 0  . sucralfate (CARAFATE) 1 g tablet Take 1 tablet (1 g total) by mouth 4 (four) times daily -  with meals and at bedtime. (Patient not taking: Reported on 09/18/2019) 120 tablet 2  . topiramate (TOPAMAX) 25 MG tablet Take 3 tablets (75 mg total) by mouth daily. (Patient taking differently: Take 75 mg by mouth at bedtime. ) 90 tablet 5   No current facility-administered medications on file prior to visit.     Review of Systems:  All pertinent positive/negative included in HPI, all other review of systems are negative   Objective:  Physical Exam BP 137/83   Pulse (!) 101   Wt 168 lb (76.2 kg)   LMP 12/22/2019 (Exact Date)   BMI 28.84 kg/m  CONSTITUTIONAL: Well-developed, well-nourished female in no acute distress.  EYES: EOM intact ENT: Normocephalic CARDIOVASCULAR: Regular rate and rhythm with no adventitious sounds.  RESPIRATORY: Normal rate.  MUSCULOSKELETAL: Normal ROM SKIN: reb blotches and rash on torso NEUROLOGICAL: Alert, oriented, CN II-XII grossly intact, Appropriate balance PSYCH: Normal  behavior, mood   Assessment & Plan:  Assessment: Urticaria Migraine - somewhat improved but continued improvement is needed  Plan: Hydroxyzine and Triamcinolone to help with itching.  See PCP/dermatology or urgent care for further assistance with this problem.   Increase topamax to 100mg  daily for migraine prevention Continue Relpax/ubrelvy for acute migraine.  Do not combine these 2 medications.  Follow-up in 6 months or sooner  PRN  Paticia Stack, PA-C 12/22/2019 9:03 AM

## 2019-12-22 NOTE — Discharge Instructions (Signed)
Take Tylenol as needed for discomfort.    Go to the Emergency Department if you have acute worsening symptoms.    Call your primary care provider to schedule the soonest available appointment.

## 2019-12-25 ENCOUNTER — Encounter: Payer: Self-pay | Admitting: *Deleted

## 2019-12-25 NOTE — Progress Notes (Signed)
Established patient visit   Patient: Sharon King   DOB: 10/23/1985   34 y.o. Female  MRN: [id number] Visit Date: 12/26/2019  Today's healthcare provider: Trinna Post, PA-C   Chief Complaint  Patient presents with  . Rash  I,Alyxandria Wentz M Avaley [employer]op,acting as a scribe for [employer], PA-C.,have documented all relevant documentation on the behalf of Trinna Post, PA-C,as directed by  Trinna Post, PA-C while in the presence of Trinna Post, PA-C.  Subjective    Rash This is a new problem. The current episode started 1 to 4 weeks ago. The problem has been gradually worsening since onset. The affected locations include the lips, face, left eye, left lower leg, right lower leg, right upper leg, left upper leg, left arm, left elbow, right elbow, right arm, right hand and left hand. The rash is characterized by blistering, dryness, pain and redness. Associated with: Raw eggs per pt. Pertinent negatives include no congestion, cough, diarrhea, fatigue or fever.    She was seen several weeks ago at urgent care for allergic reaction after eggs were spilled on her. She was seen at urgent care on  12/14/2019 and was placed on solumedrol and prednisone taper. She also received prednisone taper and bendaryl. On 12/19/2019 she received another prednisone taper and zyrtec. She is still having some hives. Currently taking zyrtec, benadryl, hydroxyzine, but not pepcid.   Dad and grandmother have rheumatoid arthritis. She is reporting bilateral knee pain, severe. She reports bilateral hand pain.     Medications: Outpatient Medications Prior to Visit  Medication Sig  . amitriptyline (ELAVIL) 50 MG tablet TAKE 1 TABLET BY MOUTH EVERYDAY AT BEDTIME  . dicyclomine (BENTYL) 10 MG capsule Take 1 capsule (10 mg total) by mouth 3 (three) times daily before meals.  . norgestimate-ethinyl estradiol (ORTHO-CYCLEN) 0.25-35 MG-MCG tablet Take 1 tablet by mouth daily. Take active pills in a  continuous fashion. Have a withdrawal bleed once every 3-6 months  . omeprazole (PRILOSEC) 20 MG capsule TAKE 1 CAPSULE BY MOUTH EVERY MORNING AND AT BEDTIME  . pantoprazole (PROTONIX) 40 MG tablet TAKE ONE TABLET TWICE DAILY  . predniSONE (STERAPRED UNI-PAK 21 TAB) 10 MG (21) TBPK tablet Take 6 tabs by mouth daily  for 1 days, then 5 tabs for 1 days, then 4 tabs for 1 days, then 3 tabs for 1 days, 2 tabs for 1 days, then 1 tab by mouth daily for 1 days  . promethazine (PHENERGAN) 12.5 MG tablet TAKE ONE TABLET BY MOUTH EVERY 6 HOURS AS NEEDED FOR NAUSEA / VOMITING  . REYVOW 100 MG TABS Take 1 tablet by mouth once. May repeat in 2 hours  . SUMAtriptan (IMITREX) 100 MG tablet Take 1 tablet (100 mg total) by mouth once as needed for up to 1 dose for migraine. May repeat in 2 hours if headache persists or recurs.  Marland Kitchen tiZANidine (ZANAFLEX) 4 MG tablet TAKE TWO TABLETS BY MOUTH AT BEDTIME  . topiramate (TOPAMAX) 100 MG tablet Take 1 tablet (100 mg total) by mouth 2 (two) times daily.  Marland Kitchen topiramate (TOPAMAX) 25 MG tablet Take 3 tablets (75 mg total) by mouth daily. (Patient taking differently: Take 75 mg by mouth at bedtime. )  . triamcinolone cream (KENALOG) 0.1 % Apply 1 application topically 2 (two) times daily.  Marland Kitchen Ubrogepant (UBRELVY) 100 MG TABS Take 100 mg by mouth as needed.  . [DISCONTINUED] cetirizine (ZYRTEC ALLERGY) 10 MG tablet Take 1 tablet (10 mg  total) by mouth daily.  . [DISCONTINUED] famotidine (PEPCID) 20 MG tablet Take 1 tablet (20 mg total) by mouth at bedtime.  . [DISCONTINUED] hydrOXYzine (ATARAX/VISTARIL) 25 MG tablet Take 1 tablet (25 mg total) by mouth every 6 (six) hours as needed for itching.  . [DISCONTINUED] sucralfate (CARAFATE) 1 g tablet Take 1 tablet (1 g total) by mouth 4 (four) times daily -  with meals and at bedtime. (Patient not taking: Reported on 09/18/2019)   No facility-administered medications prior to visit.    Review of Systems  Constitutional: Negative for  fatigue and fever.  HENT: Negative for congestion.   Respiratory: Negative for cough.   Gastrointestinal: Negative for diarrhea.  Skin: Positive for rash.  Hematological: Negative.       Objective    BP 122/75 (BP Location: Left Arm, Patient Position: Sitting, Cuff Size: Normal)   Pulse 88   Temp 98.7 F (37.1 C) (Oral)   Wt 189 lb 6.4 oz (85.9 kg)   LMP 12/22/2019 (Exact Date)   BMI 32.51 kg/m    Physical Exam Constitutional:      Appearance: Normal appearance.  Cardiovascular:     Rate and Rhythm: Normal rate and regular rhythm.  Pulmonary:     Effort: Pulmonary effort is normal. No respiratory distress.  Musculoskeletal:     Right hand: Swelling present. No deformity.     Left hand: Swelling present. No deformity.  Skin:    Findings: Rash present.       Neurological:     General: No focal deficit present.     Mental Status: She is alert and oriented to person, place, and time.  Psychiatric:        Mood and Affect: Mood normal.        Behavior: Behavior normal.       No results found for any visits on 12/26/19.  Assessment & Plan    1. Urticaria  Continue zyrtec and hydroxyzine. Add pepcid 20 mg BID as well. Refer to allergy.   - EPINEPHrine 0.3 mg/0.3 mL IJ SOAJ injection; Inject 0.3 mg into the muscle as needed for anaphylaxis.  Dispense: 1 each; Refill: 1 - cetirizine (ZYRTEC ALLERGY) 10 MG tablet; Take 1 tablet (10 mg total) by mouth daily.  Dispense: 30 tablet; Refill: 0 - hydrOXYzine (ATARAX/VISTARIL) 25 MG tablet; Take 1 tablet (25 mg total) by mouth every 6 (six) hours as needed for itching.  Dispense: 30 tablet; Refill: 1 - Ambulatory referral to Allergy  2. Swelling of both hands  - CBC with Differential - Comprehensive Metabolic Panel (CMET) - C-reactive protein - Sed Rate (ESR) - Rheumatoid Factor - ANA  3. Acute pain of both knees  - CBC with Differential - Comprehensive Metabolic Panel (CMET) - C-reactive protein - Sed Rate  (ESR) - Rheumatoid Factor - ANA  No follow-ups on file.      ITrinna Post, PA-C, have reviewed all documentation for this visit. The documentation on 12/26/19 for the exam, diagnosis, procedures, and orders are all accurate and complete.  The entirety of the information documented in the History of Present Illness, Review of Systems and Physical Exam were personally obtained by me. Portions of this information were initially documented by [facility] and reviewed by me for thoroughness and accuracy.     Paulene Floor  [facility] [phone] (phone) [phone] (fax)  [place]

## 2019-12-26 ENCOUNTER — Encounter: Payer: Self-pay | Admitting: Physician Assistant

## 2019-12-26 ENCOUNTER — Other Ambulatory Visit: Payer: Self-pay

## 2019-12-26 ENCOUNTER — Ambulatory Visit: Payer: Medicaid Other | Admitting: Physician Assistant

## 2019-12-26 VITALS — BP 122/75 | HR 88 | Temp 98.7°F | Wt 189.4 lb

## 2019-12-26 DIAGNOSIS — M7989 Other specified soft tissue disorders: Secondary | ICD-10-CM

## 2019-12-26 DIAGNOSIS — L509 Urticaria, unspecified: Secondary | ICD-10-CM

## 2019-12-26 DIAGNOSIS — M25561 Pain in right knee: Secondary | ICD-10-CM | POA: Diagnosis not present

## 2019-12-26 DIAGNOSIS — M25562 Pain in left knee: Secondary | ICD-10-CM

## 2019-12-26 MED ORDER — FAMOTIDINE 20 MG PO TABS: 20.0000 mg | ORAL_TABLET | Freq: Two times a day (BID) | ORAL | 0 refills | Status: AC

## 2019-12-26 MED ORDER — EPINEPHRINE 0.3 MG/0.3ML IJ SOAJ: 0.3000 mg | INTRAMUSCULAR | 1 refills | Status: AC | PRN

## 2019-12-26 MED ORDER — HYDROXYZINE HCL 25 MG PO TABS: 25.0000 mg | ORAL_TABLET | Freq: Four times a day (QID) | ORAL | 1 refills | Status: DC | PRN

## 2019-12-26 MED ORDER — CETIRIZINE HCL 10 MG PO TABS: 10.0000 mg | ORAL_TABLET | Freq: Every day | ORAL | 0 refills | Status: DC

## 2019-12-26 NOTE — Patient Instructions (Signed)
Angioedema  Angioedema is sudden swelling in the body. The swelling can happen in any part of the body. It often happens on the skin and causes itchy, bumpy patches (hives) to form. This condition may:  Happen only one time.  Happen more than one time. It may come back at random times.  Keep coming back for a number of years. Someday it may stop coming back. Follow these instructions at home:  Take over-the-counter and prescription medicines only as told by your doctor.  If you were given medicines for emergency allergy treatment, always carry them with you.  Wear a medical bracelet as told by your doctor.  Avoid the things that cause your attacks (triggers).  If this condition was passed to you from your parents and you want to have kids, talk to your doctor. Your kids may also have this condition. Contact a doctor if:  You have another attack.  Your attacks happen more often, even after you take steps to prevent them.  This condition was passed to you by your parents and you want to have kids. Get help right away if:  Your mouth, tongue, or lips get very swollen.  You have trouble breathing.  You have trouble swallowing.  You pass out (faint). This information is not intended to replace advice given to you by your health care provider. Make sure you discuss any questions you have with your health care provider. Document Revised: 03/12/2017 Document Reviewed: 10/08/2015 Elsevier Patient Education  2020 [employer]vier Inc.  

## 2019-12-27 ENCOUNTER — Ambulatory Visit: Payer: Medicaid Other | Admitting: Physician Assistant

## 2019-12-28 LAB — CBC WITH DIFFERENTIAL/PLATELET
Basophils Absolute: 0 10*3/uL (ref 0.0–0.2)
Basos: 0 %
EOS (ABSOLUTE): 0.2 10*3/uL (ref 0.0–0.4)
Eos: 2 %
Hematocrit: 37.8 % (ref 34.0–46.6)
Hemoglobin: 11.8 g/dL (ref 11.1–15.9)
Immature Grans (Abs): 0.1 10*3/uL (ref 0.0–0.1)
Immature Granulocytes: 1 %
Lymphocytes Absolute: 2.8 10*3/uL (ref 0.7–3.1)
Lymphs: 21 %
MCH: 27.4 pg (ref 26.6–33.0)
MCHC: 31.2 g/dL — ABNORMAL LOW (ref 31.5–35.7)
MCV: 88 fL (ref 79–97)
Monocytes Absolute: 0.7 10*3/uL (ref 0.1–0.9)
Monocytes: 6 %
Neutrophils Absolute: 9.2 10*3/uL — ABNORMAL HIGH (ref 1.4–7.0)
Neutrophils: 70 %
Platelets: 413 10*3/uL (ref 150–450)
RBC: 4.3 x10E6/uL (ref 3.77–5.28)
RDW: 13.3 % (ref 11.7–15.4)
WBC: 13 10*3/uL — ABNORMAL HIGH (ref 3.4–10.8)

## 2019-12-28 LAB — COMPREHENSIVE METABOLIC PANEL
ALT: 14 IU/L (ref 0–32)
AST: 15 IU/L (ref 0–40)
Albumin/Globulin Ratio: 1.7 (ref 1.2–2.2)
Albumin: 3.8 g/dL (ref 3.8–4.8)
Alkaline Phosphatase: 87 IU/L (ref 44–121)
BUN/Creatinine Ratio: 16 (ref 9–23)
BUN: 10 mg/dL (ref 6–20)
Bilirubin Total: 0.3 mg/dL (ref 0.0–1.2)
CO2: 23 mmol/L (ref 20–29)
Calcium: 9.1 mg/dL (ref 8.7–10.2)
Chloride: 103 mmol/L (ref 96–106)
Creatinine, Ser: 0.62 mg/dL (ref 0.57–1.00)
GFR calc Af Amer: 137 mL/min/{1.73_m2} (ref >59)
GFR calc non Af Amer: 119 mL/min/{1.73_m2} (ref >59)
Globulin, Total: 2.3 g/dL (ref 1.5–4.5)
Glucose: 83 mg/dL (ref 65–99)
Potassium: 4.1 mmol/L (ref 3.5–5.2)
Sodium: 141 mmol/L (ref 134–144)
Total Protein: 6.1 g/dL (ref 6.0–8.5)

## 2019-12-28 LAB — RHEUMATOID FACTOR: Rheumatoid fact SerPl-aCnc: <10 IU/mL (ref 0.0–13.9)

## 2019-12-28 LAB — ANA: ANA Titer 1: NEGATIVE

## 2019-12-28 LAB — C-REACTIVE PROTEIN: CRP: 62 mg/L — ABNORMAL HIGH (ref 0–10)

## 2019-12-28 LAB — SEDIMENTATION RATE: Sed Rate: 35 mm/hr — ABNORMAL HIGH (ref 0–32)

## 2020-01-12 ENCOUNTER — Other Ambulatory Visit: Payer: Self-pay | Admitting: Physician Assistant

## 2020-01-12 DIAGNOSIS — M62838 Other muscle spasm: Secondary | ICD-10-CM

## 2020-01-12 DIAGNOSIS — L509 Urticaria, unspecified: Secondary | ICD-10-CM

## 2020-01-12 NOTE — Telephone Encounter (Addendum)
Patient requested Rx refill- inadvertently sent both RF to pharmacy while trying to separate them- apologies. Pharmacy called and verbally canceled Zanaflex.  Sent for provider for review

## 2020-01-12 NOTE — Addendum Note (Signed)
Addended by: Valli Glance F on: 01/12/2020 04:47 PM   Modules accepted: Orders

## 2020-01-17 ENCOUNTER — Other Ambulatory Visit: Payer: Self-pay | Admitting: Physician Assistant

## 2020-01-17 ENCOUNTER — Encounter: Payer: Self-pay | Admitting: Physician Assistant

## 2020-01-17 ENCOUNTER — Telehealth: Payer: Self-pay

## 2020-01-17 DIAGNOSIS — M62838 Other muscle spasm: Secondary | ICD-10-CM

## 2020-01-17 DIAGNOSIS — L509 Urticaria, unspecified: Secondary | ICD-10-CM

## 2020-01-17 NOTE — Telephone Encounter (Signed)
Can we refill zanaflex and atarax at [facility]. Same scripts as before are OK.

## 2020-01-17 NOTE — Telephone Encounter (Signed)
Requested medication (s) are due for refill today:yes  Requested medication (s) are on the active medication list: yes  Last refill:11/30/19  #60  1 refill  Future visit scheduled  Notes to clinic: not delegated. Pharmacy states they did not receive refill 01/12/20 .  Medication Refill - Medication: tiZANidine (ZANAFLEX) 4 MG tablet (Pharmacy stated that they were having electronic/internet issues when medicatio was sent over and now they need the medication resent back to them.)    Requested Prescriptions  Pending Prescriptions Disp Refills   tiZANidine (ZANAFLEX) 4 MG tablet 60 tablet 0    Sig: Take 2 tablets (8 mg total) by mouth at bedtime.      Not Delegated - Cardiovascular:  Alpha-2 Agonists - tizanidine Failed - 01/17/2020  2:48 PM      Failed - This refill cannot be delegated      Passed - Valid encounter within last 6 months    Recent Outpatient Visits           3 weeks ago [place] Carles Collet M, PA-C   7 months ago Annual physical exam   [facility] Carles Collet M, [place]   1 year ago Cough   [place], Dionne Bucy, MD   1 year ago Muscle spasm   [place], [place], [place]   1 year ago Insomnia, unspecified type   [place], [place], [place]

## 2020-01-17 NOTE — Telephone Encounter (Signed)
Medication Refill - Medication: tiZANidine (ZANAFLEX) 4 MG tablet (Pharmacy stated that they were having electronic/internet issues when medicatio was sent over and now they need the medication resent back to them.)   Has the patient contacted their pharmacy? yes (Agent: If no, request that the patient contact the pharmacy for the refill.) (Agent: If yes, when and what did the pharmacy advise?)Contact PCP  Preferred Pharmacy (with phone number or street name): [place], [place] - [address] [place] Phone:  [phone]  Fax:  [phone]       Agent: Please be advised that RX refills may take up to 3 business days. We ask that you follow-up with your pharmacy.

## 2020-01-17 NOTE — Telephone Encounter (Signed)
Copied from [place] [phone]. Topic: General - Inquiry >> Jan 17, 2020  2:47 PM Gillis Ends D wrote: Reason for CRM: Patient understand that a prescription was sent to the pharmacy on 01-12-2020 but the pharmacy is stating that they never received the prescription on 01-12-2020 for the zanaflex. The patient would like for it to be sent again. Please advise.

## 2020-01-17 NOTE — Telephone Encounter (Signed)
Medication was sent into the pharmacy.  

## 2020-01-18 NOTE — Telephone Encounter (Signed)
Medication was sent on 01/17/2020.

## 2020-01-18 NOTE — Telephone Encounter (Signed)
Medication was sent on 01/17/20.

## 2020-01-20 ENCOUNTER — Other Ambulatory Visit: Payer: Self-pay | Admitting: Gastroenterology

## 2020-01-26 ENCOUNTER — Encounter: Payer: Self-pay | Admitting: Physician Assistant

## 2020-01-26 ENCOUNTER — Other Ambulatory Visit: Payer: Self-pay | Admitting: Obstetrics & Gynecology

## 2020-01-26 DIAGNOSIS — Z3041 Encounter for surveillance of contraceptive pills: Secondary | ICD-10-CM

## 2020-01-29 MED ORDER — NORGESTIMATE-ETH ESTRADIOL 0.25-35 MG-MCG PO TABS: 1.0000 | ORAL_TABLET | Freq: Every day | ORAL | 0 refills | Status: DC

## 2020-02-01 DIAGNOSIS — J3081 Allergic rhinitis due to animal (cat) (dog) hair and dander: Secondary | ICD-10-CM | POA: Diagnosis not present

## 2020-02-01 DIAGNOSIS — L501 Idiopathic urticaria: Secondary | ICD-10-CM | POA: Diagnosis not present

## 2020-02-01 DIAGNOSIS — J301 Allergic rhinitis due to pollen: Secondary | ICD-10-CM | POA: Diagnosis not present

## 2020-02-01 DIAGNOSIS — J3089 Other allergic rhinitis: Secondary | ICD-10-CM | POA: Diagnosis not present

## 2020-02-20 ENCOUNTER — Telehealth: Payer: Self-pay | Admitting: Radiology

## 2020-02-20 NOTE — Telephone Encounter (Signed)
Left message for patient to call [facility] to schedule headache f/u with [name]

## 2020-02-21 ENCOUNTER — Other Ambulatory Visit: Payer: Self-pay | Admitting: Physician Assistant

## 2020-03-01 ENCOUNTER — Other Ambulatory Visit: Payer: Self-pay | Admitting: Physician Assistant

## 2020-03-01 DIAGNOSIS — M62838 Other muscle spasm: Secondary | ICD-10-CM

## 2020-03-01 NOTE — Telephone Encounter (Signed)
Requested medication (s) are due for refill today - yes  Requested medication (s) are on the active medication list -yes  Future visit scheduled -no  Last refill: 01/17/20  Notes to clinic: Request non delegated Rx  Requested Prescriptions  Pending Prescriptions Disp Refills   tiZANidine (ZANAFLEX) 4 MG tablet [Pharmacy Med Name: TIZANIDINE HCL 4 MG TAB] 60 tablet 0    Sig: TAKE 2 TABLETS BY MOUTH AT BEDTIME      Not Delegated - Cardiovascular:  Alpha-2 Agonists - tizanidine Failed - 03/01/2020 10:39 AM      Failed - This refill cannot be delegated      Passed - Valid encounter within last 6 months    Recent Outpatient Visits           2 months ago [place] Carles Collet M, PA-C   8 months ago Annual physical exam   [facility] Carles Collet M, [place]   1 year ago Cough   [place], Dionne Bucy, MD   1 year ago Muscle spasm   [facility] Carles Collet M, [place]   2 years ago Insomnia, unspecified type   [place], Adriana M, [place]                  Requested Prescriptions  Pending Prescriptions Disp Refills   tiZANidine (ZANAFLEX) 4 MG tablet [Pharmacy Med Name: TIZANIDINE HCL 4 MG TAB] 60 tablet 0    Sig: TAKE 2 TABLETS BY MOUTH AT BEDTIME      Not Delegated - Cardiovascular:  Alpha-2 Agonists - tizanidine Failed - 03/01/2020 10:39 AM      Failed - This refill cannot be delegated      Passed - Valid encounter within last 6 months    Recent Outpatient Visits           2 months ago [place] Carles Collet M, PA-C   8 months ago Annual physical exam   [facility] [place], [place], [place]   1 year ago Cough   [facility] [place], Dionne Bucy, MD   1 year ago Muscle spasm   [place], [place], [place]   2 years ago Insomnia, unspecified type   [place], [place], [place]

## 2020-03-18 ENCOUNTER — Other Ambulatory Visit: Payer: Self-pay | Admitting: Physician Assistant

## 2020-03-22 ENCOUNTER — Other Ambulatory Visit: Payer: Self-pay | Admitting: Physician Assistant

## 2020-04-16 ENCOUNTER — Other Ambulatory Visit: Payer: Medicaid Other

## 2020-04-16 ENCOUNTER — Other Ambulatory Visit: Payer: Self-pay

## 2020-04-16 DIAGNOSIS — Z20822 Contact with and (suspected) exposure to covid-19: Secondary | ICD-10-CM

## 2020-04-17 ENCOUNTER — Other Ambulatory Visit: Payer: Self-pay

## 2020-04-17 NOTE — Progress Notes (Signed)
MyChart Video Visit    Virtual Visit via Video Note   This visit type was conducted due to national recommendations for restrictions regarding the COVID-19 Pandemic (e.g. social distancing) in an effort to limit this patient's exposure and mitigate transmission in our community. This patient is at least at moderate risk for complications without adequate follow up. This format is felt to be most appropriate for this patient at this time. Physical exam was limited by quality of the video and audio technology used for the visit.   Patient location: Home Provider location: Office   I discussed the limitations of evaluation and management by telemedicine and the availability of in person appointments. The patient expressed understanding and agreed to proceed.  Patient: Sharon King   DOB: Sep 07, 1985   35 y.o. Female  MRN: [id number] Visit Date: 04/18/2020  Today's healthcare provider: Trey Sailors, PA-C   Chief Complaint  Patient presents with  . URI  . Covid Exposure  . Influenza Exposure  I,Tashanda Fuhrer M Ismahan Lippman,acting as a scribe for [employer], PA-C.,have documented all relevant documentation on the behalf of Trey Sailors, PA-C,as directed by  Trey Sailors, PA-C while in the presence of Trey Sailors, PA-C.  Subjective    URI  This is a new problem. The current episode started in the past 7 days. The problem has been unchanged. The maximum temperature recorded prior to her arrival was 103 - 104 F. Associated symptoms include congestion, coughing, diarrhea, headaches, nausea, rhinorrhea, sinus pain, a sore throat and vomiting. Pertinent negatives include no ear pain. She has tried decongestant, antihistamine and increased fluids for the symptoms. The treatment provided mild relief.    Son diagnosed with influenza A on 04/09/2020, has since been staying separately since 04/06/2020. Her boyfriend currently has COVID, tested positive on 04/16/2020. Reports the  cough is the most bothersome symptom. She has nausea, diarrhea. Patient's symptoms started NYE 04/12/2020. Patient is unvaccinated.    Medications: Outpatient Medications Prior to Visit  Medication Sig  . cetirizine (ZYRTEC) 10 MG tablet TAKE ONE TABLET BY MOUTH EVERY DAY  . dicyclomine (BENTYL) 10 MG capsule Take 1 capsule (10 mg total) by mouth 3 (three) times daily before meals.  Marland Kitchen EPINEPHrine 0.3 mg/0.3 mL IJ SOAJ injection Inject 0.3 mg into the muscle as needed for anaphylaxis.  . hydrOXYzine (ATARAX/VISTARIL) 25 MG tablet TAKE ONE TABLET BY MOUTH EVERY 6 HOURS AS NEEDED FOR ITCHING  . norgestimate-ethinyl estradiol (ORTHO-CYCLEN) 0.25-35 MG-MCG tablet Take 1 tablet by mouth daily. Take active pills in a continuous fashion. Have a withdrawal bleed once every 3-6 months  . omeprazole (PRILOSEC) 20 MG capsule TAKE 1 CAPSULE BY MOUTH EVERY MORNING AND AT BEDTIME  . promethazine (PHENERGAN) 12.5 MG tablet TAKE ONE TABLET BY MOUTH EVERY 6 HOURS AS NEEDED FOR NAUSEA / VOMITING  . REYVOW 100 MG TABS Take 1 tablet by mouth once. May repeat in 2 hours  . SPRINTEC 28 0.25-35 MG-MCG tablet TAKE 1 TABLET DAILY. TAKE ACTIVE PILLS IN A CONTINUOUS FASHION. HAVE A WITHDRAWAL BLEED EVERY 3-6 MONTHS  . SUMAtriptan (IMITREX) 100 MG tablet Take 1 tablet (100 mg total) by mouth once as needed for up to 1 dose for migraine. May repeat in 2 hours if headache persists or recurs.  Marland Kitchen tiZANidine (ZANAFLEX) 4 MG tablet TAKE 2 TABLETS BY MOUTH AT BEDTIME  . topiramate (TOPAMAX) 100 MG tablet TAKE 1 TABLET BY MOUTH TWICE DAILY  . triamcinolone cream (KENALOG) 0.1 %  Apply 1 application topically 2 (two) times daily.  Marland Kitchen Ubrogepant (UBRELVY) 100 MG TABS Take 100 mg by mouth as needed.  . famotidine (PEPCID) 20 MG tablet Take 1 tablet (20 mg total) by mouth 2 (two) times daily.  . [DISCONTINUED] amitriptyline (ELAVIL) 50 MG tablet TAKE 1 TABLET BY MOUTH EVERYDAY AT BEDTIME  . [DISCONTINUED] pantoprazole (PROTONIX) 40 MG  tablet TAKE ONE TABLET TWICE DAILY  . [DISCONTINUED] predniSONE (STERAPRED UNI-PAK 21 TAB) 10 MG (21) TBPK tablet Take 6 tabs by mouth daily  for 1 days, then 5 tabs for 1 days, then 4 tabs for 1 days, then 3 tabs for 1 days, 2 tabs for 1 days, then 1 tab by mouth daily for 1 days  . [DISCONTINUED] topiramate (TOPAMAX) 25 MG tablet Take 3 tablets (75 mg total) by mouth daily. (Patient taking differently: Take 75 mg by mouth at bedtime. )   No facility-administered medications prior to visit.    Review of Systems  Constitutional: Positive for appetite change and fever (104.0,04/17/2020). Negative for chills and fatigue.  HENT: Positive for congestion, postnasal drip, rhinorrhea, sinus pressure, sinus pain and sore throat. Negative for ear pain.   Respiratory: Positive for cough and chest tightness. Negative for shortness of breath.   Gastrointestinal: Positive for diarrhea, nausea and vomiting.  Neurological: Positive for weakness and headaches.      Objective    There were no vitals taken for this visit.   Physical Exam Constitutional:      Appearance: Normal appearance.  Pulmonary:     Effort: Pulmonary effort is normal. No respiratory distress.  Neurological:     Mental Status: She is alert.  Psychiatric:        Mood and Affect: Mood normal.        Behavior: Behavior normal.        Assessment & Plan    1. Close exposure to COVID-19 virus  COVID test pending, counseled on return precautions and available treatments.   2. Exposure to influenza   3. Upper respiratory tract infection, unspecified type  - promethazine-dextromethorphan (PROMETHAZINE-DM) 6.25-15 MG/5ML syrup; Take 5 mLs by mouth 3 (three) times daily as needed for cough.  Dispense: 118 mL; Refill: 0 - albuterol (VENTOLIN HFA) 108 (90 Base) MCG/ACT inhaler; Inhale 2 puffs into the lungs every 6 (six) hours as needed for wheezing or shortness of breath.  Dispense: 1 each; Refill: 2   No follow-ups on file.      I discussed the assessment and treatment plan with the patient. The patient was provided an opportunity to ask questions and all were answered. The patient agreed with the plan and demonstrated an understanding of the instructions.   The patient was advised to call back or seek an in-person evaluation if the symptoms worsen or if the condition fails to improve as anticipated.   ITrinna Post, PA-C, have reviewed all documentation for this visit. The documentation on 04/18/20 for the exam, diagnosis, procedures, and orders are all accurate and complete.  The entirety of the information documented in the History of Present Illness, Review of Systems and Physical Exam were personally obtained by me. Portions of this information were initially documented by [facility] and reviewed by me for thoroughness and accuracy.    Paulene Floor [facility] [phone] (phone) [phone] (fax)  [place]

## 2020-04-18 ENCOUNTER — Encounter: Payer: Self-pay | Admitting: Physician Assistant

## 2020-04-18 ENCOUNTER — Telehealth (INDEPENDENT_AMBULATORY_CARE_PROVIDER_SITE_OTHER): Payer: Medicaid Other | Admitting: Physician Assistant

## 2020-04-18 DIAGNOSIS — Z20822 Contact with and (suspected) exposure to covid-19: Secondary | ICD-10-CM | POA: Diagnosis not present

## 2020-04-18 DIAGNOSIS — Z20828 Contact with and (suspected) exposure to other viral communicable diseases: Secondary | ICD-10-CM | POA: Diagnosis not present

## 2020-04-18 DIAGNOSIS — J069 Acute upper respiratory infection, unspecified: Secondary | ICD-10-CM | POA: Diagnosis not present

## 2020-04-18 MED ORDER — PROMETHAZINE-DM 6.25-15 MG/5ML PO SYRP: 5.0000 mL | ORAL_SOLUTION | Freq: Three times a day (TID) | ORAL | 0 refills | Status: DC | PRN

## 2020-04-18 MED ORDER — ALBUTEROL SULFATE HFA 108 (90 BASE) MCG/ACT IN AERS: 2.0000 | INHALATION_SPRAY | Freq: Four times a day (QID) | RESPIRATORY_TRACT | 2 refills | Status: DC | PRN

## 2020-04-18 NOTE — Telephone Encounter (Signed)
Patient was able to get on visit.

## 2020-04-22 ENCOUNTER — Encounter: Payer: Self-pay | Admitting: Physician Assistant

## 2020-04-22 LAB — NOVEL CORONAVIRUS, NAA: SARS-CoV-2, NAA: DETECTED — AB

## 2020-05-09 ENCOUNTER — Other Ambulatory Visit: Payer: Self-pay | Admitting: Physician Assistant

## 2020-05-09 DIAGNOSIS — M62838 Other muscle spasm: Secondary | ICD-10-CM

## 2020-05-09 NOTE — Telephone Encounter (Signed)
Requested medication (s) are due for refill today: yes  Requested medication (s) are on the active medication list: yes  Last refill:  03/04/20 #60  Future visit scheduled: no  Notes to clinic:  Please review for refill. Refill not delegated per protocol    Requested Prescriptions  Pending Prescriptions Disp Refills   tiZANidine (ZANAFLEX) 4 MG tablet [Pharmacy Med Name: TIZANIDINE HCL 4 MG TAB] 60 tablet 0    Sig: TAKE 2 TABLETS BY MOUTH AT BEDTIME      Not Delegated - Cardiovascular:  Alpha-2 Agonists - tizanidine Failed - 05/09/2020 10:54 AM      Failed - This refill cannot be delegated      Passed - Valid encounter within last 6 months    Recent Outpatient Visits           3 weeks ago Close exposure to COVID-19 virus   [place], Wendee Beavers, PA-C   4 months ago [place] Carles Collet M, [place]   10 months ago Annual physical exam   [facility] [place], Wendee Beavers, [place]   1 year ago Cough   [place], Dionne Bucy, MD   2 years ago Muscle spasm   [place], [place], [place]

## 2020-05-14 ENCOUNTER — Other Ambulatory Visit: Payer: Self-pay | Admitting: Physician Assistant

## 2020-05-14 ENCOUNTER — Encounter: Payer: Self-pay | Admitting: Physician Assistant

## 2020-05-14 DIAGNOSIS — M62838 Other muscle spasm: Secondary | ICD-10-CM

## 2020-05-14 MED ORDER — TIZANIDINE HCL 4 MG PO TABS: ORAL_TABLET | ORAL | 0 refills | Status: DC

## 2020-06-06 DIAGNOSIS — J3081 Allergic rhinitis due to animal (cat) (dog) hair and dander: Secondary | ICD-10-CM | POA: Diagnosis not present

## 2020-06-06 DIAGNOSIS — J3089 Other allergic rhinitis: Secondary | ICD-10-CM | POA: Diagnosis not present

## 2020-06-06 DIAGNOSIS — L501 Idiopathic urticaria: Secondary | ICD-10-CM | POA: Diagnosis not present

## 2020-06-06 DIAGNOSIS — J301 Allergic rhinitis due to pollen: Secondary | ICD-10-CM | POA: Diagnosis not present

## 2020-06-13 ENCOUNTER — Other Ambulatory Visit: Payer: Self-pay | Admitting: Physician Assistant

## 2020-06-13 DIAGNOSIS — L509 Urticaria, unspecified: Secondary | ICD-10-CM

## 2020-06-18 ENCOUNTER — Other Ambulatory Visit: Payer: Self-pay | Admitting: Physician Assistant

## 2020-06-18 DIAGNOSIS — M62838 Other muscle spasm: Secondary | ICD-10-CM

## 2020-06-18 NOTE — Telephone Encounter (Signed)
Requested medication (s) are due for refill today:   Provider to review  Requested medication (s) are on the active medication list:   Yes  Future visit scheduled:   No   Last ordered: 05/14/2020 #60, 0 refills  Non delegated refill   Requested Prescriptions  Pending Prescriptions Disp Refills   tiZANidine (ZANAFLEX) 4 MG tablet [Pharmacy Med Name: TIZANIDINE HCL 4 MG TAB] 60 tablet 0    Sig: TAKE TWO TABLETS BY MOUTH AT BEDTIME      Not Delegated - Cardiovascular:  Alpha-2 Agonists - tizanidine Failed - 06/18/2020 10:46 AM      Failed - This refill cannot be delegated      Passed - Valid encounter within last 6 months    Recent Outpatient Visits           2 months ago Close exposure to COVID-19 virus   [place], Wendee Beavers, PA-C   5 months ago [place], Wendee Beavers, [place]   1 year ago Annual physical exam   [facility] [place], Wendee Beavers, [place]   1 year ago Cough   [place], Dionne Bucy, MD   2 years ago Muscle spasm   [place], [place], [place]

## 2020-06-27 ENCOUNTER — Other Ambulatory Visit: Payer: Self-pay | Admitting: Physician Assistant

## 2020-07-02 ENCOUNTER — Other Ambulatory Visit: Payer: Self-pay | Admitting: Physician Assistant

## 2020-07-11 ENCOUNTER — Other Ambulatory Visit: Payer: Self-pay | Admitting: Physician Assistant

## 2020-07-17 ENCOUNTER — Other Ambulatory Visit: Payer: Self-pay | Admitting: Physician Assistant

## 2020-07-17 DIAGNOSIS — L509 Urticaria, unspecified: Secondary | ICD-10-CM

## 2020-07-27 ENCOUNTER — Other Ambulatory Visit: Payer: Self-pay | Admitting: Physician Assistant

## 2020-08-12 ENCOUNTER — Other Ambulatory Visit: Payer: Self-pay

## 2020-08-12 DIAGNOSIS — J3081 Allergic rhinitis due to animal (cat) (dog) hair and dander: Secondary | ICD-10-CM | POA: Diagnosis not present

## 2020-08-12 DIAGNOSIS — M62838 Other muscle spasm: Secondary | ICD-10-CM

## 2020-08-12 DIAGNOSIS — L501 Idiopathic urticaria: Secondary | ICD-10-CM | POA: Diagnosis not present

## 2020-08-12 DIAGNOSIS — J301 Allergic rhinitis due to pollen: Secondary | ICD-10-CM | POA: Diagnosis not present

## 2020-08-12 DIAGNOSIS — J3089 Other allergic rhinitis: Secondary | ICD-10-CM | POA: Diagnosis not present

## 2020-08-12 NOTE — Telephone Encounter (Signed)
[facility] faxed refill request for the following medications:  tiZANidine (ZANAFLEX) 4 MG tablet    Please advise.

## 2020-08-13 ENCOUNTER — Other Ambulatory Visit: Payer: Self-pay | Admitting: *Deleted

## 2020-08-13 MED ORDER — TOPIRAMATE 100 MG PO TABS: 100.0000 mg | ORAL_TABLET | Freq: Every day | ORAL | 2 refills | Status: DC

## 2020-08-13 MED ORDER — TIZANIDINE HCL 4 MG PO TABS: ORAL_TABLET | ORAL | 1 refills | Status: DC

## 2020-08-29 DIAGNOSIS — J301 Allergic rhinitis due to pollen: Secondary | ICD-10-CM | POA: Diagnosis not present

## 2020-08-29 DIAGNOSIS — J3081 Allergic rhinitis due to animal (cat) (dog) hair and dander: Secondary | ICD-10-CM | POA: Diagnosis not present

## 2020-08-29 DIAGNOSIS — L501 Idiopathic urticaria: Secondary | ICD-10-CM | POA: Diagnosis not present

## 2020-08-29 DIAGNOSIS — J3089 Other allergic rhinitis: Secondary | ICD-10-CM | POA: Diagnosis not present

## 2020-10-03 DIAGNOSIS — L501 Idiopathic urticaria: Secondary | ICD-10-CM | POA: Diagnosis not present

## 2020-10-09 ENCOUNTER — Other Ambulatory Visit: Payer: Self-pay | Admitting: Physician Assistant

## 2020-10-10 ENCOUNTER — Telehealth: Payer: Self-pay | Admitting: Physician Assistant

## 2020-10-10 DIAGNOSIS — L509 Urticaria, unspecified: Secondary | ICD-10-CM

## 2020-10-10 MED ORDER — CETIRIZINE HCL 10 MG PO TABS: 10.0000 mg | ORAL_TABLET | Freq: Every day | ORAL | 0 refills | Status: DC

## 2020-10-10 NOTE — Telephone Encounter (Signed)
[facility] faxed refill request for the following medications:  cetirizine (ZYRTEC) 10 MG tablet   Please advise.

## 2020-10-18 ENCOUNTER — Encounter: Payer: Medicaid Other | Admitting: Physician Assistant

## 2020-11-05 ENCOUNTER — Other Ambulatory Visit: Payer: Self-pay | Admitting: Family Medicine

## 2020-11-05 DIAGNOSIS — J301 Allergic rhinitis due to pollen: Secondary | ICD-10-CM | POA: Diagnosis not present

## 2020-11-05 DIAGNOSIS — M62838 Other muscle spasm: Secondary | ICD-10-CM

## 2020-11-05 DIAGNOSIS — J3089 Other allergic rhinitis: Secondary | ICD-10-CM | POA: Diagnosis not present

## 2020-11-05 DIAGNOSIS — L509 Urticaria, unspecified: Secondary | ICD-10-CM

## 2020-11-05 DIAGNOSIS — L501 Idiopathic urticaria: Secondary | ICD-10-CM | POA: Diagnosis not present

## 2020-11-05 DIAGNOSIS — J3081 Allergic rhinitis due to animal (cat) (dog) hair and dander: Secondary | ICD-10-CM | POA: Diagnosis not present

## 2020-11-05 NOTE — Telephone Encounter (Signed)
Former Writer pt. Ok to send in?

## 2020-11-05 NOTE — Telephone Encounter (Signed)
Requested medication (s) are due for refill today -unsure  Requested medication (s) are on the active medication list -yes  Future visit scheduled -no  Last refill: 10/10/20  Notes to clinic: RF has duplicate therapy warning-hydroxyzine also listed. Rx sent for reveiw  Requested Prescriptions  Pending Prescriptions Disp Refills   cetirizine (ZYRTEC) 10 MG tablet [Pharmacy Med Name: CETIRIZINE HCL 10 MG TAB] 30 tablet 0    Sig: TAKE 1 TABLET BY MOUTH DAILY      Ear, Nose, and Throat:  Antihistamines Passed - 11/05/2020  1:42 PM      Passed - Valid encounter within last 12 months    Recent Outpatient Visits           6 months ago Close exposure to COVID-19 virus   [facility] [place], Adriana M, PA-C   10 months ago [place], Wendee Beavers, [place]   1 year ago Annual physical exam   [facility] Carles Collet M, [place]   1 year ago Cough   [facility] [place], Dionne Bucy, MD   2 years ago Muscle spasm   [place], [place], [place]                    Requested Prescriptions  Pending Prescriptions Disp Refills   cetirizine (ZYRTEC) 10 MG tablet [Pharmacy Med Name: CETIRIZINE HCL 10 MG TAB] 30 tablet 0    Sig: TAKE 1 TABLET BY MOUTH DAILY      Ear, Nose, and Throat:  Antihistamines Passed - 11/05/2020  1:42 PM      Passed - Valid encounter within last 12 months    Recent Outpatient Visits           6 months ago Close exposure to COVID-19 virus   [facility] [place], [place], PA-C   10 months ago [place], Wendee Beavers, [place]   1 year ago Annual physical exam   [facility] [place], Wendee Beavers, [place]   1 year ago Cough   [place], Dionne Bucy, MD   2 years ago Muscle spasm   [place], [place], [place]

## 2020-11-05 NOTE — Telephone Encounter (Signed)
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

## 2020-11-05 NOTE — Telephone Encounter (Signed)
Requested medication (s) are due for refill today:  yes   Requested medication (s) are on the active medication list: yes   Last refill:  08/13/2020  Future visit scheduled:no  Notes to clinic:  this refill cannot be delegated    Requested Prescriptions  Pending Prescriptions Disp Refills   tiZANidine (ZANAFLEX) 4 MG tablet [Pharmacy Med Name: TIZANIDINE HCL 4 MG TAB] 60 tablet 1    Sig: TAKE TWO TABLETS BY MOUTH AT BEDTIME      Not Delegated - Cardiovascular:  Alpha-2 Agonists - tizanidine Failed - 11/05/2020  1:41 PM      Failed - This refill cannot be delegated      Failed - Valid encounter within last 6 months    Recent Outpatient Visits           6 months ago Close exposure to COVID-19 virus   [place], Wendee Beavers, PA-C   10 months ago [place], Wendee Beavers, [place]   1 year ago Annual physical exam   [facility] [place], Wendee Beavers, [place]   1 year ago Cough   [place], Dionne Bucy, MD   2 years ago Muscle spasm   [place], [place], [place]

## 2020-11-15 ENCOUNTER — Other Ambulatory Visit: Payer: Self-pay

## 2020-11-15 ENCOUNTER — Ambulatory Visit (INDEPENDENT_AMBULATORY_CARE_PROVIDER_SITE_OTHER): Payer: Medicaid Other | Admitting: Physician Assistant

## 2020-11-15 VITALS — BP 133/84 | HR 89 | Wt 195.0 lb

## 2020-11-15 DIAGNOSIS — G43709 Chronic migraine without aura, not intractable, without status migrainosus: Secondary | ICD-10-CM | POA: Diagnosis not present

## 2020-11-15 DIAGNOSIS — M62838 Other muscle spasm: Secondary | ICD-10-CM | POA: Diagnosis not present

## 2020-11-15 MED ORDER — REYVOW 100 MG PO TABS: 100.0000 mg | ORAL_TABLET | ORAL | 5 refills | Status: AC | PRN

## 2020-11-15 MED ORDER — UBRELVY 100 MG PO TABS: 100.0000 mg | ORAL_TABLET | ORAL | 11 refills | Status: DC | PRN

## 2020-11-15 MED ORDER — TOPIRAMATE 50 MG PO TABS: 150.0000 mg | ORAL_TABLET | Freq: Every day | ORAL | 11 refills | Status: DC

## 2020-11-15 NOTE — Progress Notes (Signed)
History:  Sharon King is a 35 y.o. G1P0101 who presents to clinic today for headache followup.  She was last seen in 09/21 when she had a significant outbreak of hives with no known cause.  She was on prednisone at that time.  She has now been put on Xolair by Dr. Donneta Romberg for allergy.   She does 2 injections ('300mg'$  total) every 28 days.  It has helped the outbreak of hives but may be contributing to an increase in lower level (severity) headaches.  More severe headaches are controlled with Reyvow.  She is very stressed and notes that may also contribute to her headaches.  They have not changed in quality. Her mom passed away suddenly on memorial day.   Her dad has a recurrence of throat cancer.   HIT6:66 Number of days in the last 4 weeks with:  Severe headache: 3 Moderate headache: 7 Mild headache: 10  No headache: 8   Past Medical History:  Diagnosis Date   Allergy    Anxiety    Asthma    Cancer ([place])    GERD (gastroesophageal reflux disease)    History of preterm delivery    Migraines    Neuromuscular disorder ([place])    Seizures ([place])    Last seizure 2014   Sleep apnea    Trigeminal neuralgia     Social History   Socioeconomic History   Marital status: Legally Separated    Spouse name: Not on file   Number of children: Not on file   Years of education: Not on file   Highest education level: Not on file  Occupational History   Not on file  Tobacco Use   Smoking status: Former   Smokeless tobacco: Former  Scientific laboratory technician Use: Never used  Substance and Sexual Activity   Alcohol use: No    Comment: rare-stopped with pregnancy   Drug use: No   Sexual activity: Not Currently    Birth control/protection: Pill  Other Topics Concern   Not on file  Social History Narrative   Not on file   Social Determinants of Health   Financial Resource Strain: Not on file  Food Insecurity: Not on file  Transportation Needs: Not on file  Physical Activity: Not on  file  Stress: Not on file  Social Connections: Not on file  Intimate Partner Violence: Not on file    Family History  Problem Relation Age of Onset   Hyperlipidemia Mother    Arthritis Mother    Depression Mother    Arthritis Father    Depression Father    Alcohol abuse Father     Allergies  Allergen Reactions   Eggs Or Egg-Derived Products Rash   Erythromycin Rash   Paroxetine Palpitations   Sulfonamide Derivatives Rash    Current Outpatient Medications on File Prior to Visit  Medication Sig Dispense Refill   albuterol (VENTOLIN HFA) 108 (90 Base) MCG/ACT inhaler Inhale 2 puffs into the lungs every 6 (six) hours as needed for wheezing or shortness of breath. 1 each 2   cetirizine (ZYRTEC) 10 MG tablet TAKE 1 TABLET BY MOUTH DAILY 30 tablet 0   dicyclomine (BENTYL) 10 MG capsule Take 1 capsule (10 mg total) by mouth 3 (three) times daily before meals. 90 capsule 0   EPINEPHrine 0.3 mg/0.3 mL IJ SOAJ injection Inject 0.3 mg into the muscle as needed for anaphylaxis. 1 each 1   famotidine (PEPCID) 20 MG tablet Take 1 tablet (20  mg total) by mouth 2 (two) times daily. 60 tablet 0   hydrOXYzine (ATARAX/VISTARIL) 25 MG tablet TAKE ONE TABLET BY MOUTH EVERY 6 HOURS AS NEEDED FOR ITCHING 30 tablet 1   norgestimate-ethinyl estradiol (ORTHO-CYCLEN) 0.25-35 MG-MCG tablet Take 1 tablet by mouth daily. Take active pills in a continuous fashion. Have a withdrawal bleed once every 3-6 months 112 tablet 0   omeprazole (PRILOSEC) 20 MG capsule TAKE 1 CAPSULE BY MOUTH EVERY MORNING AND AT BEDTIME 60 capsule 2   promethazine (PHENERGAN) 12.5 MG tablet TAKE ONE TABLET BY MOUTH EVERY 6 HOURS AS NEEDED FOR NAUSEA / VOMITING 30 tablet 0   promethazine-dextromethorphan (PROMETHAZINE-DM) 6.25-15 MG/5ML syrup Take 5 mLs by mouth 3 (three) times daily as needed for cough. 118 mL 0   REYVOW 100 MG TABS Take 1 tablet by mouth once. May repeat in 2 hours     SPRINTEC 28 0.25-35 MG-MCG tablet TAKE 1 TABLET  DAILY. TAKE ACTIVE PILLS IN A CONTINUOUS FASHION. HAVE A WITHDRAWAL BLEED EVERY 3-6 MONTHS 84 tablet 4   SUMAtriptan (IMITREX) 100 MG tablet Take 1 tablet (100 mg total) by mouth once as needed for up to 1 dose for migraine. May repeat in 2 hours if headache persists or recurs. 9 tablet 11   tiZANidine (ZANAFLEX) 4 MG tablet TAKE TWO TABLETS BY MOUTH AT BEDTIME 60 tablet 1   topiramate (TOPAMAX) 100 MG tablet TAKE 1 TABLET BY MOUTH DAILY 30 tablet 2   triamcinolone cream (KENALOG) 0.1 % Apply 1 application topically 2 (two) times daily. 30 g 0   Ubrogepant (UBRELVY) 100 MG TABS Take 100 mg by mouth as needed. 8 tablet 5   No current facility-administered medications on file prior to visit.     Review of Systems:  All pertinent positive/negative included in HPI, all other review of systems are negative   Objective:  Physical Exam BP 133/84   Pulse 89   Wt 195 lb (88.5 kg)   BMI 33.47 kg/m  CONSTITUTIONAL: Well-developed, well-nourished female in no acute distress.  EYES: EOM intact ENT: Normocephalic CARDIOVASCULAR: Regular rate  RESPIRATORY: Normal rate.  MUSCULOSKELETAL: Normal ROM, SKIN: Warm, dry without erythema  NEUROLOGICAL: Alert, oriented, CN II-XII grossly intact, Appropriate balance  PSYCH: Normal behavior, mood   Assessment & Plan:  Assessment: 1. Chronic migraine without aura without status migrainosus, not intractable   2. Muscle spasm    Inadequate improvement  Plan: Increase Topamax to '150mg'$  daily for migraine prevention Ubrelvy for acute migraine Reyvow for migraine rescue Follow-up in 6-12 months or sooner PRN  Paticia Stack, PA-C 11/15/2020 10:34 AM

## 2020-11-18 ENCOUNTER — Encounter: Payer: Self-pay | Admitting: Physician Assistant

## 2020-11-18 ENCOUNTER — Encounter: Payer: Self-pay | Admitting: *Deleted

## 2020-11-30 ENCOUNTER — Other Ambulatory Visit: Payer: Self-pay | Admitting: Family Medicine

## 2020-11-30 DIAGNOSIS — L509 Urticaria, unspecified: Secondary | ICD-10-CM

## 2020-11-30 NOTE — Telephone Encounter (Signed)
Requested Prescriptions  Pending Prescriptions Disp Refills  . cetirizine (ZYRTEC) 10 MG tablet [Pharmacy Med Name: CETIRIZINE HCL 10 MG TAB] 30 tablet 0    Sig: TAKE ONE TABLET BY MOUTH EVERY DAY     Ear, Nose, and Throat:  Antihistamines Passed - 11/30/2020  8:01 AM      Passed - Valid encounter within last 12 months    Recent Outpatient Visits          7 months ago Close exposure to COVID-19 virus   [place], [place]   11 months ago [place], Wendee Beavers, [place]   1 year ago Annual physical exam   [facility] [place], Wendee Beavers, [place]   1 year ago Cough   [place], Dionne Bucy, MD   2 years ago Muscle spasm   [place], [place], [place]

## 2020-12-10 ENCOUNTER — Other Ambulatory Visit: Payer: Self-pay | Admitting: Obstetrics & Gynecology

## 2020-12-10 DIAGNOSIS — Z3041 Encounter for surveillance of contraceptive pills: Secondary | ICD-10-CM

## 2020-12-12 ENCOUNTER — Encounter: Payer: Self-pay | Admitting: *Deleted

## 2020-12-19 ENCOUNTER — Other Ambulatory Visit: Payer: Self-pay | Admitting: Family Medicine

## 2020-12-19 DIAGNOSIS — M62838 Other muscle spasm: Secondary | ICD-10-CM

## 2020-12-20 NOTE — Telephone Encounter (Signed)
Requested medications are due for refill today yes  Requested medications are on the active medication list yes  Last refill 11/05/20  Last visit seen for COVID 04/2020, urticaria 12/26/19, 06/2019 for physical  Future visit scheduled no  Notes to clinic Not Delegated.

## 2020-12-23 ENCOUNTER — Encounter (HOSPITAL_COMMUNITY): Payer: Self-pay

## 2020-12-23 ENCOUNTER — Ambulatory Visit (HOSPITAL_COMMUNITY)
Admission: RE | Admit: 2020-12-23 | Discharge: 2020-12-23 | Disposition: A | Discharge status: Home / Self Care | Payer: Medicaid Other | Source: Ambulatory Visit | Attending: Emergency Medicine | Admitting: Emergency Medicine

## 2020-12-23 ENCOUNTER — Other Ambulatory Visit: Payer: Self-pay

## 2020-12-23 VITALS — BP 130/88 | HR 116 | Temp 99.4°F | Resp 18

## 2020-12-23 DIAGNOSIS — B349 Viral infection, unspecified: Secondary | ICD-10-CM | POA: Insufficient documentation

## 2020-12-23 DIAGNOSIS — J069 Acute upper respiratory infection, unspecified: Secondary | ICD-10-CM | POA: Insufficient documentation

## 2020-12-23 LAB — POCT RAPID STREP A, ED / UC: Streptococcus, Group A Screen (Direct): NEGATIVE

## 2020-12-23 MED ORDER — LIDOCAINE VISCOUS HCL 2 % MT SOLN: 15.0000 mL | OROMUCOSAL | 0 refills | Status: DC | PRN

## 2020-12-23 MED ORDER — BENZONATATE 100 MG PO CAPS: 100.0000 mg | ORAL_CAPSULE | Freq: Three times a day (TID) | ORAL | 0 refills | Status: DC

## 2020-12-23 MED ORDER — PROMETHAZINE-DM 6.25-15 MG/5ML PO SYRP: 5.0000 mL | ORAL_SOLUTION | Freq: Three times a day (TID) | ORAL | 0 refills | Status: DC | PRN

## 2020-12-23 NOTE — Discharge Instructions (Addendum)
Your rapid strep test today was negative, he has been sent to lab to see if it grows bacteria, if this occurs then you will be notified again medication will be sent in for use  You may use lidocaine solution 15 mL, gargle and spit, every 4 hours as needed,, be mindful this medication has a numbing effect  You can use Tessalon pill every 8 hours as needed for cough.  You can use Promethazine DM as every 8 hours as needed, please be mindful of this medication you can have a drowsy effect    You can take Tylenol and/or Ibuprofen as needed for fever reduction and pain relief.   For cough: honey 1/2 to 1 teaspoon (you can dilute the honey in water or another fluid).  You can also use guaifenesin and dextromethorphan for cough. You can use a humidifier for chest congestion and cough.  If you don't have a humidifier, you can sit in the bathroom with the hot shower running.      For sore throat: try warm salt water gargles, cepacol lozenges, throat spray, warm tea or water with lemon/honey, popsicles or ice, or OTC cold relief medicine for throat discomfort.   For congestion: take a daily anti-histamine like Zyrtec, Claritin, and a oral decongestant, such as pseudoephedrine.  You can also use Flonase 1-2 sprays in each nostril daily.   It is important to stay hydrated: drink plenty of fluids (water, gatorade/powerade/pedialyte, juices, or teas) to keep your throat moisturized and help further relieve irritation/discomfort.

## 2020-12-23 NOTE — ED Triage Notes (Signed)
Since Saturday pt having cough, congestion, sore throat. And headache Had home covid test that was negative. Fevers been up to 101. Hasnt taken any medications today.

## 2020-12-23 NOTE — ED Provider Notes (Addendum)
[facility]    CSN: [id number] Arrival date & time: 12/23/20  1043      History   Chief Complaint Chief Complaint  Patient presents with   Cough   Sore Throat    HPI Sharon King is a 35 y.o. female.   Patient presents with nonproductive cough, nasal congestion, rhinorrhea, intermittent fever peaking at 101.0, sore throat and intermittent generalized headache for 2 days.  Poor appetite but tolerating liquids.  Unvaccinated, no known sick contacts.  Attempted use of axial with no relief.  Denies shortness of breath, wheezing, pain, nausea, vomiting, ear pain.  History of asthma, allergic rhinitis, chronic migraines without aura, GERD, seizures, trigeminal neuralgia, cancer, anxiety.  Patient does not believe this is a normal upper respiratory infection, feels like this may be related to use of [place] monthly injections for [place] area.  Last dose 2 weeks ago, has had 3-4 doses total.   Past Medical History:  Diagnosis Date   Allergy    Anxiety    Asthma    Cancer ([place])    GERD (gastroesophageal reflux disease)    History of preterm delivery    Migraines    Neuromuscular disorder ([place])    Seizures ([place])    Last seizure 2014   Sleep apnea    Trigeminal neuralgia     Patient Active Problem List   Diagnosis Date Noted   Urticaria 12/22/2019   Abdominal pain    Acute upper GI bleed 08/02/2019   Chest pain due to GERD 08/02/2019   Chronic migraine without aura 06/23/2019   Left shoulder pain 03/04/2018   Muscle spasm 02/18/2016   ALLERGIC RHINITIS 04/22/2007   Asthma 11/06/2006   Migraines 11/06/2006    Past Surgical History:  Procedure Laterality Date   BREAST ENHANCEMENT SURGERY  2008   BREAST SURGERY     CESAREAN SECTION N/A 06/13/2015   Procedure: CESAREAN SECTION;  Surgeon: Lavonia Drafts, MD;  Location: [facility];  Service: Obstetrics;  Laterality: N/A;   COSMETIC SURGERY     ESOPHAGOGASTRODUODENOSCOPY (EGD) WITH PROPOFOL N/A 08/30/2019    Procedure: ESOPHAGOGASTRODUODENOSCOPY (EGD) WITH PROPOFOL;  Surgeon: Virgel Manifold, MD;  Location: [facility];  Service: Endoscopy;  Laterality: N/A;   wisdom teeth      OB History     Gravida  1   Para  1   Term      Preterm  1   AB      Living  1      SAB      IAB      Ectopic      Multiple  0   Live Births  1            Home Medications    Prior to Admission medications   Medication Sig Start Date End Date Taking? Authorizing Provider  albuterol (VENTOLIN HFA) 108 (90 Base) MCG/ACT inhaler Inhale 2 puffs into the lungs every 6 (six) hours as needed for wheezing or shortness of breath. 04/18/20   Trinna Post, PA-C  cetirizine (ZYRTEC) 10 MG tablet TAKE ONE TABLET BY MOUTH EVERY DAY 11/30/20   Bacigalupo, Dionne Bucy, MD  dicyclomine (BENTYL) 10 MG capsule Take 1 capsule (10 mg total) by mouth 3 (three) times daily before meals. 11/02/19   [name] B, MD  EPINEPHrine 0.3 mg/0.3 mL IJ SOAJ injection Inject 0.3 mg into the muscle as needed for anaphylaxis. 12/26/19   Trinna Post, PA-C  famotidine (PEPCID) 20  MG tablet Take 1 tablet (20 mg total) by mouth 2 (two) times daily. 12/26/19 01/25/20  Trinna Post, PA-C  hydrOXYzine (ATARAX/VISTARIL) 25 MG tablet TAKE ONE TABLET BY MOUTH EVERY 6 HOURS AS NEEDED FOR ITCHING 06/13/20   Carles Collet M, PA-C  norgestimate-ethinyl estradiol (ORTHO-CYCLEN) 0.25-35 MG-MCG tablet Take 1 tablet by mouth daily. Take active pills in a continuous fashion. Have a withdrawal bleed once every 3-6 months 01/29/20   Trinna Post, PA-C  omeprazole (PRILOSEC) 20 MG capsule TAKE 1 CAPSULE BY MOUTH EVERY MORNING AND AT BEDTIME 09/26/19   Virgel Manifold, MD  promethazine-dextromethorphan (PROMETHAZINE-DM) 6.25-15 MG/5ML syrup Take 5 mLs by mouth 3 (three) times daily as needed for cough. 04/18/20   Trinna Post, PA-C  SPRINTEC 28 0.25-35 MG-MCG tablet TAKE 1 TABLET DAILY. TAKE ACTIVE PILLS IN A  CONTINUOUS FASHION. HAVE A WITHDRAWAL BLEED EVERY 3-6 MONTHS 12/11/20   Anyanwu, Sallyanne Havers, MD  tiZANidine (ZANAFLEX) 4 MG tablet TAKE TWO TABLETS BY MOUTH AT BEDTIME 12/21/20   Birdie Sons, MD  topiramate (TOPAMAX) 50 MG tablet Take 3 tablets (150 mg total) by mouth daily. 11/15/20   Jaclyn Prime, Collene Leyden, PA-C  triamcinolone cream (KENALOG) 0.1 % Apply 1 application topically 2 (two) times daily. 12/22/19   Teague Carlis Abbott, Collene Leyden, PA-C  Ubrogepant (UBRELVY) 100 MG TABS Take 100 mg by mouth as needed. 11/15/20   Paticia Stack, PA-C    Family History Family History  Problem Relation Age of Onset   Hyperlipidemia Mother    Arthritis Mother    Depression Mother    Arthritis Father    Depression Father    Alcohol abuse Father     Social History Social History   Tobacco Use   Smoking status: Former   Smokeless tobacco: Former  Scientific laboratory technician Use: Never used  Substance Use Topics   Alcohol use: No    Comment: rare-stopped with pregnancy   Drug use: No     Allergies   Eggs or egg-derived products, Erythromycin, Paroxetine, and Sulfonamide derivatives   Review of Systems Review of Systems Defer to HPI   Physical Exam Triage Vital Signs ED Triage Vitals  Enc Vitals Group     BP 12/23/20 1214 130/88     Pulse Rate 12/23/20 1214 (!) 116     Resp 12/23/20 1214 18     Temp 12/23/20 1214 99.4 F (37.4 C)     Temp Source 12/23/20 1214 Oral     SpO2 12/23/20 1214 96 %     Weight --      Height --      Head Circumference --      Peak Flow --      Pain Score 12/23/20 1213 9     Pain Loc --      Pain Edu? --      Excl. in [place]? --    No data found.  Updated Vital Signs BP 130/88 (BP Location: Left Arm)   Pulse (!) 116   Temp 99.4 F (37.4 C) (Oral)   Resp 18   SpO2 96%   Visual Acuity Right Eye Distance:   Left Eye Distance:   Bilateral Distance:    Right Eye Near:   Left Eye Near:    Bilateral Near:     Physical Exam Constitutional:       Appearance: She is well-developed and normal weight.  HENT:     Head: Normocephalic.  Right Ear: Tympanic membrane and ear canal normal.     Left Ear: Tympanic membrane and ear canal normal.     Nose: Congestion and rhinorrhea present.     Mouth/Throat:     Mouth: Mucous membranes are moist.     Pharynx: Posterior oropharyngeal erythema present.     Tonsils: No tonsillar exudate or tonsillar abscesses. 1+ on the right. 1+ on the left.  Eyes:     Pupils: Pupils are equal, round, and reactive to light.  Cardiovascular:     Rate and Rhythm: Normal rate and regular rhythm.     Heart sounds: Normal heart sounds.  Pulmonary:     Effort: Pulmonary effort is normal.     Breath sounds: Normal breath sounds.  Musculoskeletal:     Cervical back: Normal range of motion.  Lymphadenopathy:     Cervical: Cervical adenopathy present.  Skin:    General: Skin is warm and dry.  Neurological:     General: No focal deficit present.     Mental Status: She is alert and oriented to person, place, and time.  Psychiatric:        Mood and Affect: Mood normal.        Behavior: Behavior normal.     UC Treatments / Results  Labs (all labs ordered are listed, but only abnormal results are displayed) Labs Reviewed  POCT RAPID STREP A, ED / UC    EKG   Radiology No results found.  Procedures Procedures (including critical care time)  Medications Ordered in UC Medications - No data to display  Initial Impression / Assessment and Plan / UC Course  I have reviewed the triage vital signs and the nursing notes.  Pertinent labs & imaging results that were available during my care of the patient were reviewed by me and considered in my medical decision making (see chart for details).  Viral illness  1.  Rapid strep test negative, sent to culture 2.  Tessalon 100 mg 3 times daily as needed 3.  Promethazine DM 6.25-15 mg/5 ml every 4 hours as needed 4.  Lidocaine viscous 2% every 4 hours as  needed 5.  Over-the-counter medications as needed for remaining symptom management 6.  Xolair injection has a less than 2% adverse effect of causing viral upper respiratory infections, discussed with patient, advised if concerned about use of medication to follow-up with prescribing provider Final Clinical Impressions(s) / UC Diagnoses   Final diagnoses:  None   Discharge Instructions   None    ED Prescriptions   None    PDMP not reviewed this encounter.   Hans Eden, NP 12/23/20 1317    Hans Eden, NP 12/23/20 1317

## 2020-12-25 LAB — CULTURE, GROUP A STREP (THRC)

## 2021-02-26 ENCOUNTER — Ambulatory Visit (INDEPENDENT_AMBULATORY_CARE_PROVIDER_SITE_OTHER): Payer: Medicaid Other | Admitting: *Deleted

## 2021-02-26 ENCOUNTER — Other Ambulatory Visit: Payer: Self-pay

## 2021-02-26 ENCOUNTER — Ambulatory Visit: Payer: Medicaid Other

## 2021-02-26 VITALS — BP 137/83 | HR 108

## 2021-02-26 DIAGNOSIS — R300 Dysuria: Secondary | ICD-10-CM

## 2021-02-26 MED ORDER — FLUCONAZOLE 150 MG PO TABS: 150.0000 mg | ORAL_TABLET | Freq: Once | ORAL | 3 refills | Status: AC

## 2021-02-26 MED ORDER — CIPROFLOXACIN HCL 500 MG PO TABS: 500.0000 mg | ORAL_TABLET | Freq: Two times a day (BID) | ORAL | 0 refills | Status: AC

## 2021-02-26 MED ORDER — PHENAZOPYRIDINE HCL 200 MG PO TABS: 200.0000 mg | ORAL_TABLET | Freq: Three times a day (TID) | ORAL | 1 refills | Status: DC | PRN

## 2021-02-26 NOTE — Progress Notes (Signed)
SUBJECTIVE: Sharon King is a 35 y.o. female who complains of urinary frequency, urgency and dysuria x 3 days with flank pain,. Denies any fever, chills, or abnormal vaginal discharge or bleeding.   OBJECTIVE: Appears well, but uncomfortable .  Vital signs are normal. Urine dipstick shows not done, due to patient taking AZO. .    ASSESSMENT: Dysuria  PLAN: Treatment per orders.  Will send urine culture. Call or return to clinic prn if these symptoms worsen or fail to improve as anticipated.

## 2021-02-26 NOTE — Progress Notes (Signed)
Patient seen and assessed by nursing staff.  Agree with documentation and plan.  

## 2021-02-26 NOTE — Addendum Note (Signed)
Addended by: Crosby Oyster on: 02/26/2021 01:34 PM   Modules accepted: Orders

## 2021-03-01 ENCOUNTER — Other Ambulatory Visit: Payer: Self-pay

## 2021-03-01 ENCOUNTER — Emergency Department
Admission: EM | Admit: 2021-03-01 | Discharge: 2021-03-02 | Disposition: A | Discharge status: Home / Self Care | Payer: Medicaid Other | Attending: Emergency Medicine | Admitting: Emergency Medicine

## 2021-03-01 DIAGNOSIS — Z87891 Personal history of nicotine dependence: Secondary | ICD-10-CM | POA: Diagnosis not present

## 2021-03-01 DIAGNOSIS — Z7951 Long term (current) use of inhaled steroids: Secondary | ICD-10-CM | POA: Insufficient documentation

## 2021-03-01 DIAGNOSIS — Z9882 Breast implant status: Secondary | ICD-10-CM | POA: Diagnosis not present

## 2021-03-01 DIAGNOSIS — R109 Unspecified abdominal pain: Secondary | ICD-10-CM | POA: Diagnosis present

## 2021-03-01 DIAGNOSIS — K828 Other specified diseases of gallbladder: Secondary | ICD-10-CM | POA: Diagnosis not present

## 2021-03-01 DIAGNOSIS — N390 Urinary tract infection, site not specified: Secondary | ICD-10-CM

## 2021-03-01 DIAGNOSIS — J45909 Unspecified asthma, uncomplicated: Secondary | ICD-10-CM | POA: Insufficient documentation

## 2021-03-01 DIAGNOSIS — K8020 Calculus of gallbladder without cholecystitis without obstruction: Secondary | ICD-10-CM | POA: Diagnosis not present

## 2021-03-01 DIAGNOSIS — K760 Fatty (change of) liver, not elsewhere classified: Secondary | ICD-10-CM | POA: Diagnosis not present

## 2021-03-01 LAB — URINALYSIS, ROUTINE W REFLEX MICROSCOPIC
Bacteria, UA: NONE SEEN
Bilirubin Urine: NEGATIVE
Glucose, UA: NEGATIVE mg/dL
Ketones, ur: NEGATIVE mg/dL
Leukocytes,Ua: NEGATIVE
Nitrite: POSITIVE — AB
Protein, ur: NEGATIVE mg/dL
Specific Gravity, Urine: 1.005 (ref 1.005–1.030)
pH: 6 (ref 5.0–8.0)

## 2021-03-01 LAB — CBC
HCT: 41.6 % (ref 36.0–46.0)
Hemoglobin: 13.2 g/dL (ref 12.0–15.0)
MCH: 27.8 pg (ref 26.0–34.0)
MCHC: 31.7 g/dL (ref 30.0–36.0)
MCV: 87.8 fL (ref 80.0–100.0)
Platelets: 464 10*3/uL — ABNORMAL HIGH (ref 150–400)
RBC: 4.74 MIL/uL (ref 3.87–5.11)
RDW: 13.7 % (ref 11.5–15.5)
WBC: 13.8 10*3/uL — ABNORMAL HIGH (ref 4.0–10.5)
nRBC: 0 % (ref 0.0–0.2)

## 2021-03-01 LAB — BASIC METABOLIC PANEL
Anion gap: 9 (ref 5–15)
BUN: 8 mg/dL (ref 6–20)
CO2: 26 mmol/L (ref 22–32)
Calcium: 9 mg/dL (ref 8.9–10.3)
Chloride: 103 mmol/L (ref 98–111)
Creatinine, Ser: 0.61 mg/dL (ref 0.44–1.00)
GFR, Estimated: >60 mL/min (ref >60)
Glucose, Bld: 101 mg/dL — ABNORMAL HIGH (ref 70–99)
Potassium: 3.6 mmol/L (ref 3.5–5.1)
Sodium: 138 mmol/L (ref 135–145)

## 2021-03-01 LAB — POC URINE PREG, ED: Preg Test, Ur: NEGATIVE

## 2021-03-01 MED ORDER — OXYCODONE-ACETAMINOPHEN 5-325 MG PO TABS
1.0000 | ORAL_TABLET | Freq: Once | ORAL | Status: AC
  Administered 2021-03-01: 1 via ORAL
  Filled 2021-03-01: qty 1

## 2021-03-01 NOTE — ED Triage Notes (Signed)
Pt presents to ER c/o possible kidney stones.  Pt states she has had pain in her groin that radiates to her back that has been going on since 11/13.  Pt states she was seen at PCP for possible UTI and was prescribed cipro and has one day left of that.  Today, pain became worse and is why she is here now.  Pt c/o urinary frequency, and pain with urination.  Pt A&O x4 at this time.

## 2021-03-02 ENCOUNTER — Emergency Department: Payer: Medicaid Other

## 2021-03-02 DIAGNOSIS — K8020 Calculus of gallbladder without cholecystitis without obstruction: Secondary | ICD-10-CM | POA: Diagnosis not present

## 2021-03-02 DIAGNOSIS — K760 Fatty (change of) liver, not elsewhere classified: Secondary | ICD-10-CM | POA: Diagnosis not present

## 2021-03-02 DIAGNOSIS — Z9882 Breast implant status: Secondary | ICD-10-CM | POA: Diagnosis not present

## 2021-03-02 DIAGNOSIS — K828 Other specified diseases of gallbladder: Secondary | ICD-10-CM | POA: Diagnosis not present

## 2021-03-02 MED ORDER — CEPHALEXIN 500 MG PO CAPS: 500.0000 mg | ORAL_CAPSULE | Freq: Three times a day (TID) | ORAL | 0 refills | Status: DC

## 2021-03-02 MED ORDER — KETOROLAC TROMETHAMINE 10 MG PO TABS: 10.0000 mg | ORAL_TABLET | Freq: Four times a day (QID) | ORAL | 0 refills | Status: DC | PRN

## 2021-03-02 MED ORDER — CEPHALEXIN 500 MG PO CAPS
500.0000 mg | ORAL_CAPSULE | Freq: Once | ORAL | Status: AC
  Administered 2021-03-02: 500 mg via ORAL
  Filled 2021-03-02: qty 1

## 2021-03-02 MED ORDER — KETOROLAC TROMETHAMINE 60 MG/2ML IM SOLN
60.0000 mg | Freq: Once | INTRAMUSCULAR | Status: AC
  Administered 2021-03-02: 60 mg via INTRAMUSCULAR
  Filled 2021-03-02: qty 2

## 2021-03-02 NOTE — ED Provider Notes (Signed)
[facility] Emergency Department Provider Note  Time seen: 12:43 AM  I have reviewed the triage vital signs and the nursing notes.   HISTORY  Chief Complaint Flank Pain and Groin Pain   HPI Sharon King is a 35 y.o. female with a past medical history of anxiety, asthma, gastric reflux, presents to the emergency department for left flank pain.  According to the patient for the past week or so she has been experiencing pain to her left groin/flank.  Was prescribed ciprofloxacin by her doctor for possible UTI.  Patient states she continues to have pain at times as well as dysuria so she came to the emergency department for evaluation.  Denies any known dysuria no vaginal bleeding or discharge.  States she is on birth control and does not have menstrual cycles.  Denies any fever or vomiting.  Does state urinary frequency.   Past Medical History:  Diagnosis Date   Allergy    Anxiety    Asthma    Cancer ([place])    GERD (gastroesophageal reflux disease)    History of preterm delivery    Migraines    Neuromuscular disorder ([place])    Seizures ([place])    Last seizure 2014   Sleep apnea    Trigeminal neuralgia     Patient Active Problem List   Diagnosis Date Noted   Urticaria 12/22/2019   Abdominal pain    Acute upper GI bleed 08/02/2019   Chest pain due to GERD 08/02/2019   Chronic migraine without aura 06/23/2019   Left shoulder pain 03/04/2018   Muscle spasm 02/18/2016   ALLERGIC RHINITIS 04/22/2007   Asthma 11/06/2006   Migraines 11/06/2006    Past Surgical History:  Procedure Laterality Date   BREAST ENHANCEMENT SURGERY  2008   BREAST SURGERY     CESAREAN SECTION N/A 06/13/2015   Procedure: CESAREAN SECTION;  Surgeon: Lavonia Drafts, MD;  Location: [facility];  Service: Obstetrics;  Laterality: N/A;   COSMETIC SURGERY     ESOPHAGOGASTRODUODENOSCOPY (EGD) WITH PROPOFOL N/A 08/30/2019   Procedure: ESOPHAGOGASTRODUODENOSCOPY (EGD) WITH  PROPOFOL;  Surgeon: Virgel Manifold, MD;  Location: [facility];  Service: Endoscopy;  Laterality: N/A;   wisdom teeth      Prior to Admission medications   Medication Sig Start Date End Date Taking? Authorizing Provider  albuterol (VENTOLIN HFA) 108 (90 Base) MCG/ACT inhaler Inhale 2 puffs into the lungs every 6 (six) hours as needed for wheezing or shortness of breath. 04/18/20   Trinna Post, PA-C  benzonatate (TESSALON) 100 MG capsule Take 1 capsule (100 mg total) by mouth every 8 (eight) hours. 12/23/20   Hans Eden, NP  cetirizine (ZYRTEC) 10 MG tablet TAKE ONE TABLET BY MOUTH EVERY DAY 11/30/20   Bacigalupo, Dionne Bucy, MD  ciprofloxacin (CIPRO) 500 MG tablet Take 1 tablet (500 mg total) by mouth 2 (two) times daily for 5 days. 02/26/21 03/03/21  Donnamae Jude, MD  dicyclomine (BENTYL) 10 MG capsule Take 1 capsule (10 mg total) by mouth 3 (three) times daily before meals. 11/02/19   [name] B, MD  EPINEPHrine 0.3 mg/0.3 mL IJ SOAJ injection Inject 0.3 mg into the muscle as needed for anaphylaxis. 12/26/19   Trinna Post, PA-C  famotidine (PEPCID) 20 MG tablet Take 1 tablet (20 mg total) by mouth 2 (two) times daily. 12/26/19 01/25/20  Trinna Post, PA-C  hydrOXYzine (ATARAX/VISTARIL) 25 MG tablet TAKE ONE TABLET BY MOUTH EVERY 6 HOURS AS NEEDED  FOR ITCHING 06/13/20   Trinna Post, PA-C  lidocaine (XYLOCAINE) 2 % solution Use as directed 15 mLs in the mouth or throat as needed for mouth pain. 12/23/20   Hans Eden, NP  norgestimate-ethinyl estradiol (ORTHO-CYCLEN) 0.25-35 MG-MCG tablet Take 1 tablet by mouth daily. Take active pills in a continuous fashion. Have a withdrawal bleed once every 3-6 months 01/29/20   Trinna Post, PA-C  omeprazole (PRILOSEC) 20 MG capsule TAKE 1 CAPSULE BY MOUTH EVERY MORNING AND AT BEDTIME 09/26/19   Virgel Manifold, MD  phenazopyridine (PYRIDIUM) 200 MG tablet Take 1 tablet (200 mg total) by mouth 3 (three)  times daily as needed for pain (urethral spasm). 02/26/21   Donnamae Jude, MD  promethazine-dextromethorphan (PROMETHAZINE-DM) 6.25-15 MG/5ML syrup Take 5 mLs by mouth 3 (three) times daily as needed for cough. 12/23/20   Hans Eden, NP  SPRINTEC 28 0.25-35 MG-MCG tablet TAKE 1 TABLET DAILY. TAKE ACTIVE PILLS IN A CONTINUOUS FASHION. HAVE A WITHDRAWAL BLEED EVERY 3-6 MONTHS 12/11/20   Anyanwu, Sallyanne Havers, MD  tiZANidine (ZANAFLEX) 4 MG tablet TAKE TWO TABLETS BY MOUTH AT BEDTIME 12/21/20   Birdie Sons, MD  topiramate (TOPAMAX) 50 MG tablet Take 3 tablets (150 mg total) by mouth daily. 11/15/20   Jaclyn Prime, Collene Leyden, PA-C  triamcinolone cream (KENALOG) 0.1 % Apply 1 application topically 2 (two) times daily. 12/22/19   Teague Carlis Abbott, Collene Leyden, PA-C  Ubrogepant (UBRELVY) 100 MG TABS Take 100 mg by mouth as needed. 11/15/20   Jaclyn Prime, Collene Leyden, PA-C    Allergies  Allergen Reactions   Eggs Or Egg-Derived Products Rash   Erythromycin Rash   Paroxetine Palpitations   Sulfonamide Derivatives Rash    Family History  Problem Relation Age of Onset   Hyperlipidemia Mother    Arthritis Mother    Depression Mother    Arthritis Father    Depression Father    Alcohol abuse Father     Social History Social History   Tobacco Use   Smoking status: Former   Smokeless tobacco: Former  Scientific laboratory technician Use: Never used  Substance Use Topics   Alcohol use: No    Comment: rare-stopped with pregnancy   Drug use: No    Review of Systems Constitutional: Negative for fever. Cardiovascular: Negative for chest pain. Respiratory: Negative for shortness of breath. Gastrointestinal: Left flank/groin pain. Genitourinary: Positive for frequency and dysuria Musculoskeletal: Negative for musculoskeletal complaints Neurological: Negative for headache All other ROS negative  ____________________________________________   PHYSICAL EXAM:  VITAL SIGNS: ED Triage Vitals [03/01/21 2236]  Enc  Vitals Group     BP (!) 147/111     Pulse Rate (!) 105     Resp 18     Temp 98.2 F (36.8 C)     Temp Source Oral     SpO2 97 %     Weight 198 lb (89.8 kg)     Height      Head Circumference      Peak Flow      Pain Score 10     Pain Loc      Pain Edu?      Excl. in [place]?    Constitutional: Alert and oriented. Well appearing and in no distress. Eyes: Normal exam ENT      Head: Normocephalic and atraumatic.      Mouth/Throat: Mucous membranes are moist. Cardiovascular: Normal rate, regular rhythm.  Respiratory: Normal respiratory effort  without tachypnea nor retractions. Breath sounds are clear Gastrointestinal: Soft and nontender. No distention.   Musculoskeletal: Nontender with normal range of motion in all extremities.  Neurologic:  Normal speech and language. No gross focal neurologic deficits Skin:  Skin is warm, dry and intact.  Psychiatric: Mood and affect are normal.   ____________________________________________    RADIOLOGY  CT renal negative  ____________________________________________   INITIAL IMPRESSION / ASSESSMENT AND PLAN / ED COURSE  Pertinent labs & imaging results that were available during my care of the patient were reviewed by me and considered in my medical decision making (see chart for details).   Patient presents emergency department for left flank pain dysuria urinary frequency.  Urinalysis is nitrite positive which could indicate a UTI but also has a small amount of hemoglobin which could indicate a kidney stone.  Discussed pros and cons of CT imaging patient agreeable to proceed.  CT scan has resulted negative showing no ureteral stones.  Given nitrite positive urine we will place the patient on Keflex and send a urine culture.  Patient agreeable plan of care.  We will also discharged with Toradol which the patient has received intramuscularly in the emergency department.  Discussed return precautions.  Patient agreeable to plan of care.  CT  otherwise negative for acute abnormality.  Avagail Whittlesey was evaluated in Emergency Department on 03/02/2021 for the symptoms described in the history of present illness. She was evaluated in the context of the global COVID-19 pandemic, which necessitated consideration that the patient might be at risk for infection with the SARS-CoV-2 virus that causes COVID-19. Institutional protocols and algorithms that pertain to the evaluation of patients at risk for COVID-19 are in a state of rapid change based on information released by regulatory bodies including the CDC and federal and state organizations. These policies and algorithms were followed during the patient's care in the ED.  ____________________________________________   FINAL CLINICAL IMPRESSION(S) / ED DIAGNOSES  Urinary tract infection   Harvest Dark, MD 03/02/21 0045

## 2021-03-03 ENCOUNTER — Telehealth: Payer: Self-pay

## 2021-03-03 LAB — URINE CULTURE: Culture: NO GROWTH

## 2021-03-03 NOTE — Telephone Encounter (Signed)
Transition Care Management Follow-up Telephone Call Date of discharge and from where: 03/03/2021 from [facility] How have you been since you were released from the hospital? Pt stated that she is feeling better and did not have any questions or concerns at this time.  Any questions or concerns? No  Items Reviewed: Did the pt receive and understand the discharge instructions provided? Yes  Medications obtained and verified? Yes  Other? No  Any new allergies since your discharge? No  Dietary orders reviewed? No Do you have support at home? Yes   Functional Questionnaire: (I = Independent and D = Dependent) ADLs: I  Bathing/Dressing- I  Meal Prep- I  Eating- I  Maintaining continence- I  Transferring/Ambulation- I  Managing Meds- I  Follow up appointments reviewed:  PCP Hospital f/u appt confirmed? No   Specialist Hospital f/u appt confirmed? No   Are transportation arrangements needed? No  If their condition worsens, is the pt aware to call PCP or go to the Emergency Dept.? Yes Was the patient provided with contact information for the PCP's office or ED? Yes Was to pt encouraged to call back with questions or concerns? Yes

## 2021-03-04 LAB — URINE CULTURE

## 2021-04-05 ENCOUNTER — Other Ambulatory Visit: Payer: Self-pay | Admitting: Family Medicine

## 2021-04-05 DIAGNOSIS — L509 Urticaria, unspecified: Secondary | ICD-10-CM

## 2021-04-05 NOTE — Telephone Encounter (Signed)
Requested Prescriptions  Pending Prescriptions Disp Refills   cetirizine (ZYRTEC) 10 MG tablet [Pharmacy Med Name: CETIRIZINE HCL 10 MG TAB] 30 tablet 0    Sig: TAKE ONE TABLET BY MOUTH EVERY DAY     Ear, Nose, and Throat:  Antihistamines Passed - 04/05/2021  9:47 AM      Passed - Valid encounter within last 12 months    Recent Outpatient Visits          11 months ago Close exposure to COVID-19 virus   [facility] [place], Wendee Beavers, [place]   1 year ago [place], Wendee Beavers, [place]   1 year ago Annual physical exam   [facility] Carles Collet M, [place]   2 years ago Cough   [place], Dionne Bucy, [place]   3 years ago Muscle spasm   [place], [place], [place]

## 2021-04-23 ENCOUNTER — Other Ambulatory Visit: Payer: Self-pay

## 2021-04-23 ENCOUNTER — Ambulatory Visit
Admission: RE | Admit: 2021-04-23 | Discharge: 2021-04-23 | Disposition: A | Discharge status: Home / Self Care | Payer: Medicaid Other | Source: Ambulatory Visit | Attending: Physician Assistant | Admitting: Physician Assistant

## 2021-04-23 VITALS — BP 119/73 | HR 118 | Temp 99.2°F | Resp 18

## 2021-04-23 DIAGNOSIS — J069 Acute upper respiratory infection, unspecified: Secondary | ICD-10-CM | POA: Diagnosis not present

## 2021-04-23 LAB — POCT INFLUENZA A/B
Influenza A, POC: NEGATIVE
Influenza B, POC: NEGATIVE

## 2021-04-23 NOTE — ED Triage Notes (Signed)
Pt c/o cough, sore throat, malaise, fatigue, emesis, decreased appetite, fever at home (99.5 f), nasal congestion without drainage, diarrhea,   Denies earache, headache, body aches or chills  Onset ~ Sunday

## 2021-04-23 NOTE — ED Provider Notes (Signed)
[facility]    CSN: [id number] Arrival date & time: 04/23/21  1147      History   Chief Complaint Chief Complaint  Patient presents with   Cough    HPI Sharon King is a 36 y.o. female.   Patient here today for evaluation of nasal congestion, cough, sore throat, fatigue and low grade fever that started about 3 days ago. She reports she has had decreased appetite and diarrhea as well.   The history is provided by the patient.  Cough Associated symptoms: fever and sore throat   Associated symptoms: no chills, no ear pain, no eye discharge, no myalgias, no shortness of breath and no wheezing    Past Medical History:  Diagnosis Date   Allergy    Anxiety    Asthma    Cancer ([place])    GERD (gastroesophageal reflux disease)    History of preterm delivery    Migraines    Neuromuscular disorder ([place])    Seizures ([place])    Last seizure 2014   Sleep apnea    Trigeminal neuralgia     Patient Active Problem List   Diagnosis Date Noted   Urticaria 12/22/2019   Abdominal pain    Acute upper GI bleed 08/02/2019   Chest pain due to GERD 08/02/2019   Chronic migraine without aura 06/23/2019   Left shoulder pain 03/04/2018   Muscle spasm 02/18/2016   ALLERGIC RHINITIS 04/22/2007   Asthma 11/06/2006   Migraines 11/06/2006    Past Surgical History:  Procedure Laterality Date   BREAST ENHANCEMENT SURGERY  2008   BREAST SURGERY     CESAREAN SECTION N/A 06/13/2015   Procedure: CESAREAN SECTION;  Surgeon: Lavonia Drafts, MD;  Location: [facility];  Service: Obstetrics;  Laterality: N/A;   COSMETIC SURGERY     ESOPHAGOGASTRODUODENOSCOPY (EGD) WITH PROPOFOL N/A 08/30/2019   Procedure: ESOPHAGOGASTRODUODENOSCOPY (EGD) WITH PROPOFOL;  Surgeon: Virgel Manifold, MD;  Location: [facility];  Service: Endoscopy;  Laterality: N/A;   wisdom teeth      OB History     Gravida  1   Para  1   Term      Preterm  1   AB      Living  1      SAB       IAB      Ectopic      Multiple  0   Live Births  1            Home Medications    Prior to Admission medications   Medication Sig Start Date End Date Taking? Authorizing Provider  albuterol (VENTOLIN HFA) 108 (90 Base) MCG/ACT inhaler Inhale 2 puffs into the lungs every 6 (six) hours as needed for wheezing or shortness of breath. 04/18/20   Trinna Post, PA-C  benzonatate (TESSALON) 100 MG capsule Take 1 capsule (100 mg total) by mouth every 8 (eight) hours. 12/23/20   White, Leitha Schuller, NP  cephALEXin (KEFLEX) 500 MG capsule Take 1 capsule (500 mg total) by mouth 3 (three) times daily. 03/02/21   Harvest Dark, MD  cetirizine (ZYRTEC) 10 MG tablet TAKE ONE TABLET BY MOUTH EVERY DAY 04/05/21   Bacigalupo, Dionne Bucy, MD  dicyclomine (BENTYL) 10 MG capsule Take 1 capsule (10 mg total) by mouth 3 (three) times daily before meals. 11/02/19   [name] B, MD  EPINEPHrine 0.3 mg/0.3 mL IJ SOAJ injection Inject 0.3 mg into the muscle as needed for anaphylaxis. 12/26/19  Carles Collet M, PA-C  famotidine (PEPCID) 20 MG tablet Take 1 tablet (20 mg total) by mouth 2 (two) times daily. 12/26/19 01/25/20  Trinna Post, PA-C  hydrOXYzine (ATARAX/VISTARIL) 25 MG tablet TAKE ONE TABLET BY MOUTH EVERY 6 HOURS AS NEEDED FOR ITCHING 06/13/20   Carles Collet M, PA-C  ketorolac (TORADOL) 10 MG tablet Take 1 tablet (10 mg total) by mouth every 6 (six) hours as needed. 03/02/21   Harvest Dark, MD  lidocaine (XYLOCAINE) 2 % solution Use as directed 15 mLs in the mouth or throat as needed for mouth pain. 12/23/20   Hans [place], NP  norgestimate-ethinyl estradiol (ORTHO-CYCLEN) 0.25-35 MG-MCG tablet Take 1 tablet by mouth daily. Take active pills in a continuous fashion. Have a withdrawal bleed once every 3-6 months 01/29/20   Trinna Post, PA-C  omeprazole (PRILOSEC) 20 MG capsule TAKE 1 CAPSULE BY MOUTH EVERY MORNING AND AT BEDTIME 09/26/19   Virgel Manifold, MD   phenazopyridine (PYRIDIUM) 200 MG tablet Take 1 tablet (200 mg total) by mouth 3 (three) times daily as needed for pain (urethral spasm). 02/26/21   Donnamae Jude, MD  promethazine-dextromethorphan (PROMETHAZINE-DM) 6.25-15 MG/5ML syrup Take 5 mLs by mouth 3 (three) times daily as needed for cough. 12/23/20   Hans [place], NP  SPRINTEC 28 0.25-35 MG-MCG tablet TAKE 1 TABLET DAILY. TAKE ACTIVE PILLS IN A CONTINUOUS FASHION. HAVE A WITHDRAWAL BLEED EVERY 3-6 MONTHS 12/11/20   Anyanwu, Sallyanne Havers, MD  tiZANidine (ZANAFLEX) 4 MG tablet TAKE TWO TABLETS BY MOUTH AT BEDTIME 12/21/20   Birdie Sons, MD  topiramate (TOPAMAX) 50 MG tablet Take 3 tablets (150 mg total) by mouth daily. 11/15/20   Jaclyn Prime, Collene Leyden, PA-C  triamcinolone cream (KENALOG) 0.1 % Apply 1 application topically 2 (two) times daily. 12/22/19   Teague Carlis Abbott, Collene Leyden, PA-C  Ubrogepant (UBRELVY) 100 MG TABS Take 100 mg by mouth as needed. 11/15/20   Paticia Stack, PA-C    Family History Family History  Problem Relation Age of Onset   Hyperlipidemia Mother    Arthritis Mother    Depression Mother    Arthritis Father    Depression Father    Alcohol abuse Father     Social History Social History   Tobacco Use   Smoking status: Former   Smokeless tobacco: Former  Scientific laboratory technician Use: Never used  Substance Use Topics   Alcohol use: No    Comment: rare-stopped with pregnancy   Drug use: No     Allergies   Eggs or egg-derived products, Erythromycin, Paroxetine, and Sulfonamide derivatives   Review of Systems Review of Systems  Constitutional:  Positive for appetite change, fatigue and fever. Negative for chills.  HENT:  Positive for congestion and sore throat. Negative for ear pain.   Eyes:  Negative for discharge and redness.  Respiratory:  Positive for cough. Negative for shortness of breath and wheezing.   Gastrointestinal:  Positive for diarrhea, nausea and vomiting. Negative for abdominal pain.   Musculoskeletal:  Negative for myalgias.    Physical Exam Triage Vital Signs ED Triage Vitals [04/23/21 1202]  Enc Vitals Group     BP 119/73     Pulse Rate (!) 118     Resp 18     Temp 99.2 F (37.3 C)     Temp Source Oral     SpO2 97 %     Weight      Height  Head Circumference      Peak Flow      Pain Score 0     Pain Loc      Pain Edu?      Excl. in [place]?    No data found.  Updated Vital Signs BP 119/73 (BP Location: Right Arm)    Pulse (!) 118    Temp 99.2 F (37.3 C) (Oral)    Resp 18    SpO2 97%      Physical Exam Vitals and nursing note reviewed.  Constitutional:      General: She is not in acute distress.    Appearance: Normal appearance. She is not ill-appearing.  HENT:     Head: Normocephalic and atraumatic.     Nose: Congestion present.     Mouth/Throat:     Mouth: Mucous membranes are moist.     Pharynx: No oropharyngeal exudate or posterior oropharyngeal erythema.  Eyes:     Conjunctiva/sclera: Conjunctivae normal.  Cardiovascular:     Rate and Rhythm: Regular rhythm. Tachycardia present.     Heart sounds: Normal heart sounds. No murmur heard. Pulmonary:     Effort: Pulmonary effort is normal. No respiratory distress.     Breath sounds: Normal breath sounds. No wheezing, rhonchi or rales.  Skin:    General: Skin is warm and dry.  Neurological:     Mental Status: She is alert.  Psychiatric:        Mood and Affect: Mood normal.        Thought Content: Thought content normal.     UC Treatments / Results  Labs (all labs ordered are listed, but only abnormal results are displayed) Labs Reviewed  NOVEL CORONAVIRUS, NAA  POCT INFLUENZA A/B    EKG   Radiology No results found.  Procedures Procedures (including critical care time)  Medications Ordered in UC Medications - No data to display  Initial Impression / Assessment and Plan / UC Course  I have reviewed the triage vital signs and the nursing notes.  Pertinent labs &  imaging results that were available during my care of the patient were reviewed by me and considered in my medical decision making (see chart for details).    Suspect viral etiology of symptoms. Flu test negative. Will screen for covid. Recommend symptomatic treatment, increased fluid and rest and follow up with any further concerns.   Final Clinical Impressions(s) / UC Diagnoses   Final diagnoses:  Acute upper respiratory infection   Discharge Instructions   None    ED Prescriptions   None    PDMP not reviewed this encounter.   Francene Finders, PA-C 04/23/21 1239

## 2021-04-24 LAB — SARS-COV-2, NAA 2 DAY TAT

## 2021-04-24 LAB — NOVEL CORONAVIRUS, NAA: SARS-CoV-2, NAA: NOT DETECTED

## 2021-05-29 DIAGNOSIS — J3081 Allergic rhinitis due to animal (cat) (dog) hair and dander: Secondary | ICD-10-CM | POA: Diagnosis not present

## 2021-05-29 DIAGNOSIS — J3089 Other allergic rhinitis: Secondary | ICD-10-CM | POA: Diagnosis not present

## 2021-05-29 DIAGNOSIS — L501 Idiopathic urticaria: Secondary | ICD-10-CM | POA: Diagnosis not present

## 2021-05-29 DIAGNOSIS — J301 Allergic rhinitis due to pollen: Secondary | ICD-10-CM | POA: Diagnosis not present

## 2021-06-26 ENCOUNTER — Other Ambulatory Visit: Payer: Self-pay | Admitting: *Deleted

## 2021-06-26 ENCOUNTER — Telehealth: Payer: Self-pay | Admitting: *Deleted

## 2021-06-26 MED ORDER — KETOROLAC TROMETHAMINE 10 MG PO TABS: 10.0000 mg | ORAL_TABLET | Freq: Four times a day (QID) | ORAL | 0 refills | Status: DC | PRN

## 2021-06-26 NOTE — Telephone Encounter (Signed)
Pt called stating she is unable to get a ride her to get Toradol injection and asked if we could send in tablets instead. Okay per Santiago Glad to send in. Advised pt that is vision gets worse to go to ED like Santiago Glad recommended. Pt verbalizes and understands ?

## 2021-07-16 ENCOUNTER — Other Ambulatory Visit: Payer: Self-pay | Admitting: Family Medicine

## 2021-07-16 DIAGNOSIS — L509 Urticaria, unspecified: Secondary | ICD-10-CM

## 2021-07-18 ENCOUNTER — Other Ambulatory Visit: Payer: Self-pay | Admitting: Family Medicine

## 2021-07-18 DIAGNOSIS — M62838 Other muscle spasm: Secondary | ICD-10-CM

## 2021-07-21 ENCOUNTER — Other Ambulatory Visit: Payer: Self-pay

## 2021-07-21 NOTE — Telephone Encounter (Signed)
Requested medication (s) are due for refill today: Yes ? ?Requested medication (s) are on the active medication list: Yes ? ?Last refill:  12/21/20 ? ?Future visit scheduled: No ? ?Notes to clinic:  See request. ? ? ? ?Requested Prescriptions  ?Pending Prescriptions Disp Refills  ? tiZANidine (ZANAFLEX) 4 MG tablet [Pharmacy Med Name: TIZANIDINE HCL 4 MG TAB] 60 tablet 3  ?  Sig: TAKE TWO TABLETS BY MOUTH AT BEDTIME  ?  ? Not Delegated - Cardiovascular:  Alpha-2 Agonists - tizanidine Failed - 07/18/2021  1:17 PM  ?  ?  Failed - This refill cannot be delegated  ?  ?  Failed - Valid encounter within last 6 months  ?  Recent Outpatient Visits   ? ?      ? 1 year ago Close exposure to COVID-19 virus  ? [facility] [place], [place], [place]  ? 1 year ago Urticaria  ? [facility] [place], Wendee Beavers, [place]  ? 2 years ago Annual physical exam  ? [facility] [place], [place]  ? 2 years ago Cough  ? [facility] [place], Dionne Bucy, MD  ? 3 years ago Muscle spasm  ? [facility] [place], [place], [place]  ? ?  ?  ? ?  ?  ?  ? ?

## 2021-07-31 ENCOUNTER — Other Ambulatory Visit: Payer: Self-pay | Admitting: Physician Assistant

## 2021-08-15 ENCOUNTER — Other Ambulatory Visit: Payer: Self-pay | Admitting: Family Medicine

## 2021-08-15 DIAGNOSIS — L509 Urticaria, unspecified: Secondary | ICD-10-CM

## 2021-08-15 DIAGNOSIS — M62838 Other muscle spasm: Secondary | ICD-10-CM

## 2021-08-18 NOTE — Telephone Encounter (Signed)
Call to patient- she state she gets medication through her allergy provider. Patient declined to make appointment today ?Requested Prescriptions  ?Pending Prescriptions Disp Refills  ?? cetirizine (ZYRTEC) 10 MG tablet [Pharmacy Med Name: CETIRIZINE HCL 10 MG TAB] 30 tablet 0  ?  Sig: TAKE ONE TABLET BY MOUTH EVERY DAY * PLEASE SCHEDULE OFFICE VISIT BEFORE ANY FUTURE REFILL*.  ?  ? Ear, Nose, and Throat:  Antihistamines 2 Failed - 08/15/2021  6:26 PM  ?  ?  Failed - Valid encounter within last 12 months  ?  Recent Outpatient Visits   ?      ? 1 year ago Close exposure to COVID-19 virus  ? [facility] [place], [place], [place]  ? 1 year ago Urticaria  ? [facility] [place], Wendee Beavers, [place]  ? 2 years ago Annual physical exam  ? [facility] Car[place] Collet [place], [place]  ? 2 years ago Cough  ? [facility] [place], Dionne Bucy, MD  ? 3 years ago Muscle spasm  ? [place], [place]  ?  ?  ? ?  ?  ?  Passed - Cr in normal range and within 360 days  ?  Creatinine  ?Date Value Ref Range Status  ?10/24/2013 0.63 0.60 - 1.30 mg/dL Final  ? ?Creatinine, Ser  ?Date Value Ref Range Status  ?03/01/2021 0.61 0.44 - 1.00 mg/dL Final  ? ?Creatinine, Urine  ?Date Value Ref Range Status  ?12/13/2014 19.09 (L) >20.0 mg/dL Final  ?   ?  ?  ? ?

## 2021-08-20 ENCOUNTER — Other Ambulatory Visit: Payer: Self-pay | Admitting: Family Medicine

## 2021-08-20 DIAGNOSIS — L509 Urticaria, unspecified: Secondary | ICD-10-CM

## 2021-09-09 ENCOUNTER — Other Ambulatory Visit: Payer: Self-pay | Admitting: Family Medicine

## 2021-09-09 DIAGNOSIS — M62838 Other muscle spasm: Secondary | ICD-10-CM

## 2021-10-28 ENCOUNTER — Other Ambulatory Visit: Payer: Self-pay | Admitting: Obstetrics & Gynecology

## 2021-10-28 DIAGNOSIS — Z3041 Encounter for surveillance of contraceptive pills: Secondary | ICD-10-CM

## 2021-10-29 ENCOUNTER — Encounter: Payer: Self-pay | Admitting: Physician Assistant

## 2021-10-29 ENCOUNTER — Ambulatory Visit (INDEPENDENT_AMBULATORY_CARE_PROVIDER_SITE_OTHER): Payer: Medicaid Other | Admitting: Physician Assistant

## 2021-10-29 VITALS — BP 121/82 | HR 94 | Temp 98.3°F | Resp 16 | Ht 64.0 in | Wt 193.5 lb

## 2021-10-29 DIAGNOSIS — Z0000 Encounter for general adult medical examination without abnormal findings: Secondary | ICD-10-CM

## 2021-10-29 DIAGNOSIS — L241 Irritant contact dermatitis due to oils and greases: Secondary | ICD-10-CM

## 2021-10-29 DIAGNOSIS — L299 Pruritus, unspecified: Secondary | ICD-10-CM

## 2021-10-29 DIAGNOSIS — L255 Unspecified contact dermatitis due to plants, except food: Secondary | ICD-10-CM

## 2021-10-29 MED ORDER — PREDNISONE 20 MG PO TABS: 20.0000 mg | ORAL_TABLET | Freq: Every day | ORAL | 0 refills | Status: DC

## 2021-10-29 NOTE — Progress Notes (Signed)
Complete physical exam  I,Sharon King,acting as a scribe for [employer], PA-C.,have documented all relevant documentation on the behalf of Sharon Speak, PA-C,as directed by  [employer], PA-C while in the presence of [employer], PA-C.   Patient: Sharon King   DOB: 12-31-1985   36 y.o. Female  MRN: [id number] Visit Date: 10/29/2021  Today's healthcare provider: [name], PA-C   Chief Complaint  Patient presents with   Annual Exam   Subjective    Sharon King is a 36 y.o. female who presents today for a complete physical exam.  She reports consuming a  well balanced  diet. Home exercise routine includes yard work. She generally feels well. She reports sleeping poorly. She does have additional problems to discuss today. Patient would like to talk about anxiety and rash on her face due to exposure to poison oak She is not feeling well due to itchy rash on her face.  Past Medical History:  Diagnosis Date   Allergy    Anxiety    Asthma    Cancer ([place])    GERD (gastroesophageal reflux disease)    History of preterm delivery    Migraines    Neuromuscular disorder ([place])    Seizures ([place])    Last seizure 2014   Sleep apnea    Trigeminal neuralgia    Past Surgical History:  Procedure Laterality Date   BREAST ENHANCEMENT SURGERY  2008   BREAST SURGERY     CESAREAN SECTION N/A 06/13/2015   Procedure: CESAREAN SECTION;  Surgeon: Lavonia Drafts, MD;  Location: [facility];  Service: Obstetrics;  Laterality: N/A;   COSMETIC SURGERY     ESOPHAGOGASTRODUODENOSCOPY (EGD) WITH PROPOFOL N/A 08/30/2019   Procedure: ESOPHAGOGASTRODUODENOSCOPY (EGD) WITH PROPOFOL;  Surgeon: Virgel Manifold, MD;  Location: [facility];  Service: Endoscopy;  Laterality: N/A;   wisdom teeth     Social History   Socioeconomic History   Marital status: Divorced    Spouse name: Not on file   Number of children: Not on file   Years of education: Not on file    Highest education level: Not on file  Occupational History   Not on file  Tobacco Use   Smoking status: Former   Smokeless tobacco: Former  Scientific laboratory technician Use: Never used  Substance and Sexual Activity   Alcohol use: No    Comment: rare-stopped with pregnancy   Drug use: No   Sexual activity: Not Currently    Birth control/protection: Pill  Other Topics Concern   Not on file  Social History Narrative   Not on file   Social Determinants of Health   Financial Resource Strain: Not on file  Food Insecurity: Not on file  Transportation Needs: Not on file  Physical Activity: Not on file  Stress: Not on file  Social Connections: Not on file  Intimate Partner Violence: Not on file   Family Status  Relation Name Status   Mother  Alive   Father  Alive   Family History  Problem Relation Age of Onset   Hyperlipidemia Mother    Arthritis Mother    Depression Mother    Arthritis Father    Depression Father    Alcohol abuse Father    Allergies  Allergen Reactions   Eggs Or Egg-Derived Products Rash   Erythromycin Rash   Paroxetine Palpitations   Sulfonamide Derivatives Rash    Patient Care Team: Sharon King, Hershal Coria as PCP -  General (Physician Assistant) Susa Day, MD as Consulting Physician (Orthopedic Surgery)   Medications: Outpatient Medications Prior to Visit  Medication Sig   cephALEXin (KEFLEX) 500 MG capsule Take 1 capsule (500 mg total) by mouth 3 (three) times daily.   cetirizine (ZYRTEC) 10 MG tablet Take 1 tablet (10 mg total) by mouth daily. Please schedule office visit before any future refill.   dicyclomine (BENTYL) 10 MG capsule Take 1 capsule (10 mg total) by mouth 3 (three) times daily before meals.   EPINEPHrine 0.3 mg/0.3 mL IJ SOAJ injection Inject 0.3 mg into the muscle as needed for anaphylaxis.   hydrOXYzine (ATARAX/VISTARIL) 25 MG tablet TAKE ONE TABLET BY MOUTH EVERY 6 HOURS AS NEEDED FOR ITCHING   ketorolac (TORADOL) 10 MG tablet  Take 1 tablet (10 mg total) by mouth every 6 (six) hours as needed.   ketorolac (TORADOL) 10 MG tablet Take 1 tablet (10 mg total) by mouth every 6 (six) hours as needed.   norgestimate-ethinyl estradiol (ORTHO-CYCLEN) 0.25-35 MG-MCG tablet Take 1 tablet by mouth daily. Take active pills in a continuous fashion. Have a withdrawal bleed once every 3-6 months   omeprazole (PRILOSEC) 20 MG capsule TAKE 1 CAPSULE BY MOUTH EVERY MORNING AND AT BEDTIME   phenazopyridine (PYRIDIUM) 200 MG tablet Take 1 tablet (200 mg total) by mouth 3 (three) times daily as needed for pain (urethral spasm).   SPRINTEC 28 0.25-35 MG-MCG tablet TAKE 1 TABLET DAILY. TAKE ACTIVE PILLS IN A CONTINUOUS FASHION. HAVE A WITHDRAWAL BLEED EVERY 3-6 MONTHS   tiZANidine (ZANAFLEX) 4 MG tablet TAKE 2 TABLETS BY MOUTH AT BEDTIME . NEEDS TO FOLLOW UP WITH LINDSAY DRUBEL, PA-C BEFORE ANY ADDITIONAL REFILLS ARE APPROVED   topiramate (TOPAMAX) 50 MG tablet Take 3 tablets (150 mg total) by mouth daily.   Ubrogepant (UBRELVY) 100 MG TABS Take 100 mg by mouth as needed.   albuterol (VENTOLIN HFA) 108 (90 Base) MCG/ACT inhaler Inhale 2 puffs into the lungs every 6 (six) hours as needed for wheezing or shortness of breath.   benzonatate (TESSALON) 100 MG capsule Take 1 capsule (100 mg total) by mouth every 8 (eight) hours.   famotidine (PEPCID) 20 MG tablet Take 1 tablet (20 mg total) by mouth 2 (two) times daily.   lidocaine (XYLOCAINE) 2 % solution Use as directed 15 mLs in the mouth or throat as needed for mouth pain.   promethazine-dextromethorphan (PROMETHAZINE-DM) 6.25-15 MG/5ML syrup Take 5 mLs by mouth 3 (three) times daily as needed for cough.   triamcinolone cream (KENALOG) 0.1 % Apply 1 application topically 2 (two) times daily.   No facility-administered medications prior to visit.    Review of Systems  Constitutional: Negative.   HENT:  Positive for ear pain and facial swelling.   Eyes: Negative.   Respiratory: Negative.     Cardiovascular: Negative.   Gastrointestinal: Negative.   Endocrine: Negative.   Genitourinary: Negative.   Musculoskeletal:  Positive for back pain, myalgias, neck pain and neck stiffness.  Skin: Negative.   Allergic/Immunologic: Positive for environmental allergies.  Neurological: Negative.   Hematological: Negative.   Psychiatric/Behavioral: Negative.        Objective     BP 121/82 (BP Location: Left Arm, Patient Position: Sitting, Cuff Size: Large)   Pulse 94   Temp 98.3 F (36.8 C) (Oral)   Resp 16   Ht '5\' 4"'$  (1.626 m)   Wt 193 lb 8 oz (87.8 kg)   BMI 33.21 kg/m     Physical  Exam Vitals reviewed.  Constitutional:      General: She is in acute distress.     Appearance: Normal appearance. She is well-developed. She is obese. She is not diaphoretic.  HENT:     Head: Normocephalic and atraumatic.     Right Ear: Tympanic membrane, ear canal and external ear normal.     Left Ear: Tympanic membrane, ear canal and external ear normal.     Nose: Congestion and rhinorrhea present.     Mouth/Throat:     Mouth: Mucous membranes are moist.     Pharynx: Oropharynx is clear. No oropharyngeal exudate.  Eyes:     General: No scleral icterus.       Right eye: No discharge.        Left eye: No discharge.     Conjunctiva/sclera: Conjunctivae normal.     Pupils: Pupils are equal, round, and reactive to light.  Neck:     Thyroid: No thyromegaly.  Cardiovascular:     Rate and Rhythm: Normal rate and regular rhythm.     Pulses: Normal pulses.     Heart sounds: Normal heart sounds. No murmur heard. Pulmonary:     Effort: Pulmonary effort is normal. No respiratory distress.     Breath sounds: Normal breath sounds. No wheezing or rales.  Abdominal:     General: Abdomen is flat. Bowel sounds are normal. There is no distension.     Palpations: Abdomen is soft.     Tenderness: There is no abdominal tenderness.  Musculoskeletal:        General: No deformity.     Cervical back:  Neck supple.     Right lower leg: No edema.     Left lower leg: No edema.  Lymphadenopathy:     Cervical: No cervical adenopathy.  Skin:    General: Skin is warm and dry.     Findings: Rash present. No bruising.     Comments: Pruritic papules surrounded by erythema / forehead, Left posterior neck area  Neurological:     General: No focal deficit present.     Mental Status: She is alert and oriented to person, place, and time. Mental status is at baseline.     Cranial Nerves: No cranial nerve deficit.     Sensory: No sensory deficit.     Motor: No weakness.     Coordination: Coordination normal.     Gait: Gait normal.     Deep Tendon Reflexes: Reflexes normal.  Psychiatric:        Behavior: Behavior normal.        Thought Content: Thought content normal.        Judgment: Judgment normal.       Last depression screening scores    10/29/2021    1:45 PM 06/16/2019    9:07 AM 02/18/2018   10:12 AM  PHQ 2/9 Scores  PHQ - 2 Score 2 0 0  PHQ- 9 Score '7 3 1   '$ Last fall risk screening    10/29/2021    1:45 PM  [place] in the past year? 0  Number falls in past yr: 0  Injury with Fall? 0   Last Audit-C alcohol use screening    10/29/2021    1:47 PM  Alcohol Use Disorder Test (AUDIT)  1. How often do you have a drink containing alcohol? 1  2. How many drinks containing alcohol do you have on a typical day when you are drinking? 0  3. How often do you have six or more drinks on one occasion? 0  AUDIT-C Score 1   A score of 3 or more in women, and 4 or more in men indicates increased risk for alcohol abuse, EXCEPT if all of the points are from question 1   No results found for any visits on 10/29/21.  Assessment & Plan    Routine Health Maintenance and Physical Exam  Exercise Activities and Dietary recommendations  Goals   Weight loss of 5% of her current weight via healthy diet and regular exercise     Immunization History  Administered Date(s) Administered    Influenza Whole 04/14/2005   Influenza,inj,Quad PF,6+ Mos 12/13/2014, 02/13/2019   Influenza-Unspecified 03/06/2020   Tdap 05/15/2015    Health Maintenance  Topic Date Due   COVID-19 Vaccine (1) Never done   Hepatitis C Screening  Never done   PAP SMEAR-Modifier  09/18/2021   INFLUENZA VACCINE  11/11/2021   TETANUS/TDAP  05/14/2025   HIV Screening  Completed   HPV VACCINES  Aged Out    Discussed health benefits of physical activity, and encouraged her to engage in regular exercise appropriate for her age and condition.  Rhus dermatitis Most likely due to poison oak/urushiol oil Has a long hx of allergic rhinitis, asthma, chronic urticaria - predniSONE (DELTASONE) 20 MG tablet; Take 1 tablet (20 mg total) by mouth daily with breakfast.  Dispense: 10 tablet; Refill: 0  Pruritis Advised OTC antihistamines: Zyrtec during the day  and hydroxyzine or benadryl at night Topical steroid OTC   Annual physical exam The rest problems she would like to discuss during her next appt FU after recovery from contact dermatitis    The patient was advised to call back or seek an in-person evaluation if the symptoms worsen or if the condition fails to improve as anticipated.  I discussed the assessment and treatment plan with the patient. The patient was provided an opportunity to ask questions and all were answered. The patient agreed with the plan and demonstrated an understanding of the instructions.  The entirety of the information documented in the History of Present Illness, Review of Systems and Physical Exam were personally obtained by me. Portions of this information were initially documented by the CMA and reviewed by me for thoroughness and accuracy.  Portions of this note were created using dictation software and may contain typographical errors.   Sharon Speak, PA-C  [facility] [phone] (phone) [phone] (fax)  [place]

## 2021-10-30 ENCOUNTER — Other Ambulatory Visit: Payer: Self-pay | Admitting: Family Medicine

## 2021-10-30 DIAGNOSIS — M62838 Other muscle spasm: Secondary | ICD-10-CM

## 2021-11-05 MED ORDER — TIZANIDINE HCL 4 MG PO TABS: ORAL_TABLET | ORAL | 0 refills | Status: DC

## 2021-11-05 NOTE — Telephone Encounter (Signed)
Please, ask if she pt is doing better with her face rash/poison ivy. Also, she needs to be seen before her next refill for muscle relaxant. Thank you

## 2021-11-06 ENCOUNTER — Other Ambulatory Visit: Payer: Self-pay | Admitting: *Deleted

## 2021-11-06 ENCOUNTER — Encounter: Payer: Self-pay | Admitting: *Deleted

## 2021-11-06 MED ORDER — REYVOW 100 MG PO TABS: 100.0000 mg | ORAL_TABLET | Freq: Once | ORAL | 0 refills | Status: AC

## 2021-11-06 MED ORDER — REYVOW 100 MG PO TABS: 100.0000 mg | ORAL_TABLET | Freq: Once | ORAL | 0 refills | Status: DC

## 2021-11-06 NOTE — Progress Notes (Signed)
RX printed, reordering

## 2021-11-12 MED ORDER — REYVOW 100 MG PO TABS: 100.0000 mg | ORAL_TABLET | ORAL | 0 refills | Status: DC | PRN

## 2021-11-18 ENCOUNTER — Other Ambulatory Visit: Payer: Self-pay | Admitting: Physician Assistant

## 2021-11-24 ENCOUNTER — Other Ambulatory Visit: Payer: Self-pay | Admitting: Physician Assistant

## 2021-11-24 ENCOUNTER — Other Ambulatory Visit: Payer: Self-pay

## 2021-11-24 DIAGNOSIS — G43709 Chronic migraine without aura, not intractable, without status migrainosus: Secondary | ICD-10-CM

## 2021-11-24 MED ORDER — TOPIRAMATE 50 MG PO TABS: 150.0000 mg | ORAL_TABLET | Freq: Every day | ORAL | 0 refills | Status: DC

## 2021-11-24 NOTE — Progress Notes (Signed)
Rx was printed and needed to be electronically submitted.  THis is done- sent to [facility].   Pt will need appt for further refills as she was last seen 11/15/2020.

## 2021-12-17 ENCOUNTER — Other Ambulatory Visit: Payer: Self-pay | Admitting: Physician Assistant

## 2021-12-17 DIAGNOSIS — M62838 Other muscle spasm: Secondary | ICD-10-CM

## 2021-12-18 ENCOUNTER — Other Ambulatory Visit: Payer: Self-pay | Admitting: Physician Assistant

## 2021-12-18 DIAGNOSIS — M62838 Other muscle spasm: Secondary | ICD-10-CM

## 2021-12-22 NOTE — Telephone Encounter (Signed)
Pt called saying this prescription is not supposed to be sent to [facility] but [facility]  Please resend rx if it has been sent to [facility]

## 2021-12-23 MED ORDER — TIZANIDINE HCL 4 MG PO TABS: ORAL_TABLET | ORAL | 0 refills | Status: DC

## 2021-12-23 NOTE — Telephone Encounter (Signed)
Pt advised to update her labs/liver health as she has been on zanaflex for 2 years and last CMP was done in 2021

## 2022-01-19 NOTE — Progress Notes (Unsigned)
   I,Soni Kegel S Carlee Tesfaye,acting as a Education administrator for Lavon Paganini, MD.,have documented all relevant documentation on the behalf of Lavon Paganini, MD,as directed by  Lavon Paganini, MD while in the presence of Lavon Paganini, MD.     Established patient visit   Patient: Sharon King   DOB: 1986/02/04   36 y.o. Female  MRN: [id number] Visit Date: 01/20/2022  Today's healthcare provider: Lavon Paganini, MD   No chief complaint on file.  Subjective    HPI  ***  Medications: Outpatient Medications Prior to Visit  Medication Sig   cephALEXin (KEFLEX) 500 MG capsule Take 1 capsule (500 mg total) by mouth 3 (three) times daily.   cetirizine (ZYRTEC) 10 MG tablet Take 1 tablet (10 mg total) by mouth daily. Please schedule office visit before any future refill.   dicyclomine (BENTYL) 10 MG capsule Take 1 capsule (10 mg total) by mouth 3 (three) times daily before meals.   EPINEPHrine 0.3 mg/0.3 mL IJ SOAJ injection Inject 0.3 mg into the muscle as needed for anaphylaxis.   famotidine (PEPCID) 20 MG tablet Take 1 tablet (20 mg total) by mouth 2 (two) times daily.   hydrOXYzine (ATARAX/VISTARIL) 25 MG tablet TAKE ONE TABLET BY MOUTH EVERY 6 HOURS AS NEEDED FOR ITCHING   ketorolac (TORADOL) 10 MG tablet Take 1 tablet (10 mg total) by mouth every 6 (six) hours as needed.   ketorolac (TORADOL) 10 MG tablet Take 1 tablet (10 mg total) by mouth every 6 (six) hours as needed.   Lasmiditan Succinate (REYVOW) 100 MG TABS Take 100 mg by mouth as needed.   norgestimate-ethinyl estradiol (ORTHO-CYCLEN) 0.25-35 MG-MCG tablet Take 1 tablet by mouth daily. Take active pills in a continuous fashion. Have a withdrawal bleed once every 3-6 months   omeprazole (PRILOSEC) 20 MG capsule TAKE 1 CAPSULE BY MOUTH EVERY MORNING AND AT BEDTIME   phenazopyridine (PYRIDIUM) 200 MG tablet Take 1 tablet (200 mg total) by mouth 3 (three) times daily as needed for pain (urethral spasm).   predniSONE  (DELTASONE) 20 MG tablet Take 1 tablet (20 mg total) by mouth daily with breakfast.   SPRINTEC 28 0.25-35 MG-MCG tablet TAKE 1 TABLET DAILY. TAKE ACTIVE PILLS IN A CONTINUOUS FASHION. HAVE A WITHDRAWAL BLEED EVERY 3-6 MONTHS   tiZANidine (ZANAFLEX) 4 MG tablet TAKE 2 TABLETS BY MOUTH AT BEDTIME . NEEDS TO FOLLOW UP WITH LINDSAY DRUBEL, PA-C BEFORE ANY ADDITIONAL REFILLS ARE APPROVED   topiramate (TOPAMAX) 50 MG tablet Take 3 tablets (150 mg total) by mouth daily.   topiramate (TOPAMAX) 50 MG tablet TAKE 3 TABLETS(150 MG) BY MOUTH DAILY   Ubrogepant (UBRELVY) 100 MG TABS Take 100 mg by mouth as needed.   No facility-administered medications prior to visit.    Review of Systems  {Labs  Heme  Chem  Endocrine  Serology  Results Review (optional):23779}   Objective    There were no vitals taken for this visit. {Show previous vital signs (optional):23777}  Physical Exam  ***  No results found for any visits on 01/20/22.  Assessment & Plan     ***  No follow-ups on file.      {provider attestation***:1}   Lavon Paganini, MD  [facility] [phone] (phone) [phone] (fax)  [place]

## 2022-01-20 ENCOUNTER — Ambulatory Visit (INDEPENDENT_AMBULATORY_CARE_PROVIDER_SITE_OTHER): Payer: Medicaid Other | Admitting: Family Medicine

## 2022-01-20 ENCOUNTER — Encounter: Payer: Self-pay | Admitting: Family Medicine

## 2022-01-20 VITALS — BP 119/83 | HR 81 | Temp 98.4°F | Resp 16 | Ht 64.0 in | Wt 193.8 lb

## 2022-01-20 DIAGNOSIS — M62838 Other muscle spasm: Secondary | ICD-10-CM

## 2022-01-20 DIAGNOSIS — F4323 Adjustment disorder with mixed anxiety and depressed mood: Secondary | ICD-10-CM

## 2022-01-20 MED ORDER — TIZANIDINE HCL 4 MG PO TABS: ORAL_TABLET | ORAL | 0 refills | Status: DC

## 2022-01-20 MED ORDER — NORTRIPTYLINE HCL 10 MG PO CAPS: 10.0000 mg | ORAL_CAPSULE | Freq: Every day | ORAL | 1 refills | Status: DC

## 2022-01-20 NOTE — Assessment & Plan Note (Signed)
New problem Related to stressors 2/2 stepdads poor health and recent loss of her mother Has had adverse effects from SSRIs, Cymbalta, and wellbutrin in the past Somewhat limited treatment options, but will try nortriptyline Cautioned about sedation - will take at night Encourage therapy PHQ and GAD7 at f/u appt

## 2022-01-22 DIAGNOSIS — G43709 Chronic migraine without aura, not intractable, without status migrainosus: Secondary | ICD-10-CM

## 2022-01-26 MED ORDER — TOPIRAMATE 50 MG PO TABS: 150.0000 mg | ORAL_TABLET | Freq: Every day | ORAL | 0 refills | Status: DC

## 2022-02-06 ENCOUNTER — Encounter: Payer: Medicaid Other | Admitting: Physician Assistant

## 2022-02-06 ENCOUNTER — Telehealth: Payer: Self-pay | Admitting: Physician Assistant

## 2022-02-08 ENCOUNTER — Other Ambulatory Visit: Payer: Self-pay | Admitting: Family Medicine

## 2022-02-08 DIAGNOSIS — M62838 Other muscle spasm: Secondary | ICD-10-CM

## 2022-02-17 NOTE — Telephone Encounter (Signed)
called patient to rescheduled missed appointment. mailbox is full so unable to leave a voicemail.

## 2022-02-24 ENCOUNTER — Other Ambulatory Visit: Payer: Self-pay | Admitting: Family Medicine

## 2022-02-24 DIAGNOSIS — M62838 Other muscle spasm: Secondary | ICD-10-CM

## 2022-02-27 ENCOUNTER — Ambulatory Visit: Payer: Medicaid Other | Admitting: Physician Assistant

## 2022-02-27 ENCOUNTER — Encounter: Payer: Self-pay | Admitting: Physician Assistant

## 2022-02-27 VITALS — BP 133/88 | HR 91 | Wt 191.0 lb

## 2022-02-27 DIAGNOSIS — G43709 Chronic migraine without aura, not intractable, without status migrainosus: Secondary | ICD-10-CM

## 2022-02-27 MED ORDER — REYVOW 100 MG PO TABS: 100.0000 mg | ORAL_TABLET | ORAL | 3 refills | Status: DC | PRN

## 2022-02-27 MED ORDER — METOPROLOL TARTRATE 25 MG PO TABS: 25.0000 mg | ORAL_TABLET | Freq: Every day | ORAL | 6 refills | Status: DC

## 2022-02-27 MED ORDER — UBRELVY 100 MG PO TABS: 100.0000 mg | ORAL_TABLET | ORAL | 11 refills | Status: DC | PRN

## 2022-02-27 MED ORDER — TOPIRAMATE 200 MG PO TABS: 200.0000 mg | ORAL_TABLET | Freq: Every day | ORAL | 6 refills | Status: DC

## 2022-02-27 NOTE — Progress Notes (Signed)
History:  Sharon King is a 36 y.o. G1P0101 who presents to clinic today for headache followup.  She was last seen 7 months ago.  She notes the headaches are related to stress and she has been more stressed while taking care of stepdad with dementia after the death of her mom and issues related to her son and ex.  She has intermittently not been sleeping well due to cats making noise in the night.   PCP started her on nortriptyline but she only used for a few days as it kept her from sleeping.  She just knew it was not for her.   Tizanidine used at night to help with sleep and muscle spasm.    HIT6:64 Number of days in the last 4 weeks with:  Severe headache: 5 Moderate headache: 5 Mild headache: 10  No headache: 8   Past Medical History:  Diagnosis Date   Allergy    Anxiety    Asthma    Cancer ([place])    GERD (gastroesophageal reflux disease)    History of preterm delivery    Migraines    Neuromuscular disorder ([place])    Seizures ([place])    Last seizure 2014   Sleep apnea    Trigeminal neuralgia     Social History   Socioeconomic History   Marital status: Divorced    Spouse name: Not on file   Number of children: Not on file   Years of education: Not on file   Highest education level: Not on file  Occupational History   Not on file  Tobacco Use   Smoking status: Former   Smokeless tobacco: Former  Scientific laboratory technician Use: Never used  Substance and Sexual Activity   Alcohol use: No    Comment: rare-stopped with pregnancy   Drug use: No   Sexual activity: Not Currently    Birth control/protection: Pill  Other Topics Concern   Not on file  Social History Narrative   Not on file   Social Determinants of Health   Financial Resource Strain: Not on file  Food Insecurity: Not on file  Transportation Needs: Not on file  Physical Activity: Not on file  Stress: Not on file  Social Connections: Not on file  Intimate Partner Violence: Not on file    Family  History  Problem Relation Age of Onset   Hyperlipidemia Mother    Arthritis Mother    Depression Mother    Arthritis Father    Depression Father    Alcohol abuse Father     Allergies  Allergen Reactions   Eggs Or Egg-Derived Products Rash   Erythromycin Rash   Paroxetine Palpitations   Sulfonamide Derivatives Rash    Current Outpatient Medications on File Prior to Visit  Medication Sig Dispense Refill   cetirizine (ZYRTEC) 10 MG tablet Take 1 tablet (10 mg total) by mouth daily. Please schedule office visit before any future refill. 30 tablet 0   EPINEPHrine 0.3 mg/0.3 mL IJ SOAJ injection Inject 0.3 mg into the muscle as needed for anaphylaxis. 1 each 1   famotidine (PEPCID) 20 MG tablet Take 1 tablet (20 mg total) by mouth 2 (two) times daily. 60 tablet 0   hydrOXYzine (ATARAX/VISTARIL) 25 MG tablet TAKE ONE TABLET BY MOUTH EVERY 6 HOURS AS NEEDED FOR ITCHING 30 tablet 1   ketorolac (TORADOL) 10 MG tablet Take 1 tablet (10 mg total) by mouth every 6 (six) hours as needed. 10 tablet 0  Lasmiditan Succinate (REYVOW) 100 MG TABS Take 100 mg by mouth as needed. 8 tablet 0   omalizumab (XOLAIR) 150 MG/ML prefilled syringe Inject into the skin every 14 (fourteen) days.     SPRINTEC 28 0.25-35 MG-MCG tablet TAKE 1 TABLET DAILY. TAKE ACTIVE PILLS IN A CONTINUOUS FASHION. HAVE A WITHDRAWAL BLEED EVERY 3-6 MONTHS 84 tablet 4   tiZANidine (ZANAFLEX) 4 MG tablet TAKE 2 TABLETS BY MOUTH AT BEDTIME 30 tablet 0   topiramate (TOPAMAX) 50 MG tablet Take 3 tablets (150 mg total) by mouth daily. 90 tablet 0   Ubrogepant (UBRELVY) 100 MG TABS Take 100 mg by mouth as needed. 10 tablet 11   No current facility-administered medications on file prior to visit.     Review of Systems:  All pertinent positive/negative included in HPI, all other review of systems are negative   Objective:  Physical Exam BP 133/88   Pulse 91   Wt 191 lb (86.6 kg)   BMI 32.79 kg/m  CONSTITUTIONAL: Well-developed,  well-nourished female in no acute distress.  EYES: EOM intact ENT: Normocephalic CARDIOVASCULAR: Regular rate and rhythm with no adventitious sounds.  RESPIRATORY: Normal rate.  MUSCULOSKELETAL: Normal ROM SKIN: Warm, dry without erythema  NEUROLOGICAL: Alert, oriented, CN II-XII grossly intact, Appropriate balance  PSYCH: Normal behavior, mood   Assessment & Plan:  Assessment: 1. Chronic migraine without aura without status migrainosus, not intractable      Plan: Increase topamax to '200mg'$  for prevention Begin metoprolol '25mg'$  for prevention Continue Ubrelvy and Reyvow for acute migraine May consider Botox if we continue to see inadequate response Follow-up in 3 months or sooner PRN  Paticia Stack, PA-C 02/27/2022 8:52 AM

## 2022-02-27 NOTE — Patient Instructions (Signed)
Migraine Headache  A migraine headache is a very strong throbbing pain on one side or both sides of your head. This type of headache can also cause other symptoms. It can last from 4 hours to 3 days. Talk with your doctor about what things may bring on (trigger) this condition.  What are the causes?  The exact cause of this condition is not known. This condition may be triggered or caused by:  Drinking alcohol.  Smoking.  Taking medicines, such as:  Medicine used to treat chest pain (nitroglycerin).  Birth control pills.  Estrogen.  Some blood pressure medicines.  Eating or drinking certain products.  Doing physical activity.  Other things that may trigger a migraine headache include:  Having a menstrual period.  Pregnancy.  Hunger.  Stress.  Not getting enough sleep or getting too much sleep.  Weather changes.  Tiredness (fatigue).  What increases the risk?  Being 25-55 years old.  Being female.  Having a family history of migraine headaches.  Being Caucasian.  Having depression or anxiety.  Being very overweight.  What are the signs or symptoms?  A throbbing pain. This pain may:  Happen in any area of the head, such as on one side or both sides.  Make it hard to do daily activities.  Get worse with physical activity.  Get worse around bright lights or loud noises.  Other symptoms may include:  Feeling sick to your stomach (nauseous).  Vomiting.  Dizziness.  Being sensitive to bright lights, loud noises, or smells.  Before you get a migraine headache, you may get warning signs (an aura). An aura may include:  Seeing flashing lights or having blind spots.  Seeing bright spots, halos, or zigzag lines.  Having tunnel vision or blurred vision.  Having numbness or a tingling feeling.  Having trouble talking.  Having weak muscles.  Some people have symptoms after a migraine headache (postdromal phase), such as:  Tiredness.  Trouble thinking (concentrating).  How is this treated?  Taking medicines that:  Relieve  pain.  Relieve the feeling of being sick to your stomach.  Prevent migraine headaches.  Treatment may also include:  Having acupuncture.  Avoiding foods that bring on migraine headaches.  Learning ways to control your body functions (biofeedback).  Therapy to help you know and deal with negative thoughts (cognitive behavioral therapy).  Follow these instructions at home:  Medicines  Take over-the-counter and prescription medicines only as told by your doctor.  Ask your doctor if the medicine prescribed to you:  Requires you to avoid driving or using heavy machinery.  Can cause trouble pooping (constipation). You may need to take these steps to prevent or treat trouble pooping:  Drink enough fluid to keep your pee (urine) pale yellow.  Take over-the-counter or prescription medicines.  Eat foods that are high in fiber. These include beans, whole grains, and fresh fruits and vegetables.  Limit foods that are high in fat and sugar. These include fried or sweet foods.  Lifestyle  Do not drink alcohol.  Do not use any products that contain nicotine or tobacco, such as cigarettes, e-cigarettes, and chewing tobacco. If you need help quitting, ask your doctor.  Get at least 8 hours of sleep every night.  Limit and deal with stress.  General instructions  Keep a journal to find out what may bring on your migraine headaches. For example, write down:  What you eat and drink.  How much sleep you get.  Any change in   what you eat or drink.  Any change in your medicines.  If you have a migraine headache:  Avoid things that make your symptoms worse, such as bright lights.  It may help to lie down in a dark, quiet room.  Do not drive or use heavy machinery.  Ask your doctor what activities are safe for you.  Keep all follow-up visits as told by your doctor. This is important.  Contact a doctor if:  You get a migraine headache that is different or worse than others you have had.  You have more than 15 headache days in one month.  Get  help right away if:  Your migraine headache gets very bad.  Your migraine headache lasts longer than 72 hours.  You have a fever.  You have a stiff neck.  You have trouble seeing.  Your muscles feel weak or like you cannot control them.  You start to lose your balance a lot.  You start to have trouble walking.  You pass out (faint).  You have a seizure.  Summary  A migraine headache is a very strong throbbing pain on one side or both sides of your head. These headaches can also cause other symptoms.  This condition may be treated with medicines and changes to your lifestyle.  Keep a journal to find out what may bring on your migraine headaches.  Contact a doctor if you get a migraine headache that is different or worse than others you have had.  Contact your doctor if you have more than 15 headache days in a month.  This information is not intended to replace advice given to you by your health care provider. Make sure you discuss any questions you have with your health care provider.  Document Revised: 09/11/2021 Document Reviewed: 05/12/2018  Elsevier Patient Education  2023 [employer] Inc[employer]

## 2022-03-03 ENCOUNTER — Encounter: Payer: Self-pay | Admitting: *Deleted

## 2022-03-03 NOTE — Progress Notes (Deleted)
I,Olson Lucarelli S Hadja Harral,acting as a Education administrator for Lavon Paganini, MD.,have documented all relevant documentation on the behalf of Lavon Paganini, MD,as directed by  Lavon Paganini, MD while in the presence of Lavon Paganini, MD.     Established patient visit   Patient: Sharon King   DOB: [birth year]   36 y.o. Female  MRN: [id number] Visit Date: 03/09/2022  Today's healthcare provider: Lavon Paganini, MD   No chief complaint on file.  Subjective    HPI  Depression, Follow-up  She  was last seen for this 6 weeks ago. Changes made at last visit include start nortriptyline, encouraged therapy.   She reports good compliance with treatment. She is having side effects. Kept her from sleeping. Patient used only for a few days.   She reports {DESC; GOOD/FAIR/POOR:18685} tolerance of treatment. Current symptoms include: {Symptoms; depression:1002} She feels she is {improved/worse/unchanged:3041574} since last visit.     01/20/2022    8:52 AM 10/29/2021    1:45 PM 06/16/2019    9:07 AM  Depression screen PHQ 2/9  Decreased Interest 1 1 0  Down, Depressed, Hopeless 1 1 0  PHQ - 2 Score 2 2 0  Altered sleeping '3 2 1  '$ Tired, decreased energy '2 1 1  '$ Change in appetite 1 0 1  Feeling bad or failure about yourself  1 1 0  Trouble concentrating 1 1 0  Moving slowly or fidgety/restless 1 0 0  Suicidal thoughts 0 0 0  PHQ-9 Score '11 7 3  '$ Difficult doing work/chores Not difficult at all Not difficult at all Not difficult at all    -----------------------------------------------------------------------------------------   Medications: Outpatient Medications Prior to Visit  Medication Sig   cetirizine (ZYRTEC) 10 MG tablet Take 1 tablet (10 mg total) by mouth daily. Please schedule office visit before any future refill.   EPINEPHrine 0.3 mg/0.3 mL IJ SOAJ injection Inject 0.3 mg into the muscle as needed for anaphylaxis.   famotidine (PEPCID) 20 MG tablet Take 1 tablet  (20 mg total) by mouth 2 (two) times daily.   hydrOXYzine (ATARAX/VISTARIL) 25 MG tablet TAKE ONE TABLET BY MOUTH EVERY 6 HOURS AS NEEDED FOR ITCHING   ketorolac (TORADOL) 10 MG tablet Take 1 tablet (10 mg total) by mouth every 6 (six) hours as needed.   Lasmiditan Succinate (REYVOW) 100 MG TABS Take 100 mg by mouth as needed.   metoprolol tartrate (LOPRESSOR) 25 MG tablet Take 1 tablet (25 mg total) by mouth daily.   omalizumab Arvid Right) 150 MG/ML prefilled syringe Inject into the skin every 14 (fourteen) days.   SPRINTEC 28 0.25-35 MG-MCG tablet TAKE 1 TABLET DAILY. TAKE ACTIVE PILLS IN A CONTINUOUS FASHION. HAVE A WITHDRAWAL BLEED EVERY 3-6 MONTHS   tiZANidine (ZANAFLEX) 4 MG tablet TAKE 2 TABLETS BY MOUTH AT BEDTIME   topiramate (TOPAMAX) 200 MG tablet Take 1 tablet (200 mg total) by mouth daily.   Ubrogepant (UBRELVY) 100 MG TABS Take 100 mg by mouth as needed.   No facility-administered medications prior to visit.    Review of Systems  {Labs  Heme  Chem  Endocrine  Serology  Results Review (optional):23779}   Objective    There were no vitals taken for this visit. {Show previous vital signs (optional):23777}  Physical Exam  ***  No results found for any visits on 03/09/22.  Assessment & Plan     ***  No follow-ups on file.      {provider attestation***:1}   Lavon Paganini, MD  [facility] [phone] (phone) [phone] (fax)  [place]

## 2022-03-09 ENCOUNTER — Ambulatory Visit: Payer: Medicaid Other | Admitting: Family Medicine

## 2022-03-13 NOTE — Progress Notes (Unsigned)
I,Koron Godeaux S Omkar Stratmann,acting as a Education administrator for Lavon Paganini, MD.,have documented all relevant documentation on the behalf of Lavon Paganini, MD,as directed by  Lavon Paganini, MD while in the presence of Lavon Paganini, MD.     Established patient visit   Patient: Sharon King   DOB: 06-26-1985   36 y.o. Female  MRN: [id number] Visit Date: 03/16/2022  Today's healthcare provider: Lavon Paganini, MD   No chief complaint on file.  Subjective    HPI  Anxiety/Depression, Follow-up  She was last seen for anxiety 2 months ago. Changes made at last visit include start nortriptyline.   She reports {excellent/good/fair/poor:19665} compliance with treatment. She reports {good/fair/poor:18685} tolerance of treatment. She {is/is not:21021397} having side effects. {document side effects if present:1}  She feels her anxiety is {Desc; severity:60313} and {improved/worse/unchanged:3041574} since last visit.  Symptoms: {Yes/No:20286} chest pain {Yes/No:20286} difficulty concentrating  {Yes/No:20286} dizziness {Yes/No:20286} fatigue  {Yes/No:20286} feelings of losing control {Yes/No:20286} insomnia  {Yes/No:20286} irritable {Yes/No:20286} palpitations  {Yes/No:20286} panic attacks {Yes/No:20286} racing thoughts  {Yes/No:20286} shortness of breath {Yes/No:20286} sweating  {Yes/No:20286} tremors/shakes    GAD-7 Results    10/29/2021    1:53 PM  GAD-7 Generalized Anxiety Disorder Screening Tool  1. Feeling Nervous, Anxious, or on Edge 2  2. Not Being Able to Stop or Control Worrying 3  3. Worrying Too Much About Different Things 3  4. Trouble Relaxing 2  5. Being So Restless it's Hard To Sit Still 2  6. Becoming Easily Annoyed or Irritable 1  7. Feeling Afraid As If Something Awful Might Happen 3  Total GAD-7 Score 16  Difficulty At Work, Home, or Getting  Along With Others? Not difficult at all    PHQ-9 Scores    01/20/2022    8:52 AM 10/29/2021    1:45 PM  06/16/2019    9:07 AM  PHQ9 SCORE ONLY  PHQ-9 Total Score '11 7 3    '$ ---------------------------------------------------------------------------------------------------   Medications: Outpatient Medications Prior to Visit  Medication Sig   cetirizine (ZYRTEC) 10 MG tablet Take 1 tablet (10 mg total) by mouth daily. Please schedule office visit before any future refill.   EPINEPHrine 0.3 mg/0.3 mL IJ SOAJ injection Inject 0.3 mg into the muscle as needed for anaphylaxis.   famotidine (PEPCID) 20 MG tablet Take 1 tablet (20 mg total) by mouth 2 (two) times daily.   hydrOXYzine (ATARAX/VISTARIL) 25 MG tablet TAKE ONE TABLET BY MOUTH EVERY 6 HOURS AS NEEDED FOR ITCHING   ketorolac (TORADOL) 10 MG tablet Take 1 tablet (10 mg total) by mouth every 6 (six) hours as needed.   Lasmiditan Succinate (REYVOW) 100 MG TABS Take 100 mg by mouth as needed.   metoprolol tartrate (LOPRESSOR) 25 MG tablet Take 1 tablet (25 mg total) by mouth daily.   omalizumab Arvid Right) 150 MG/ML prefilled syringe Inject into the skin every 14 (fourteen) days.   SPRINTEC 28 0.25-35 MG-MCG tablet TAKE 1 TABLET DAILY. TAKE ACTIVE PILLS IN A CONTINUOUS FASHION. HAVE A WITHDRAWAL BLEED EVERY 3-6 MONTHS   tiZANidine (ZANAFLEX) 4 MG tablet TAKE 2 TABLETS BY MOUTH AT BEDTIME   topiramate (TOPAMAX) 200 MG tablet Take 1 tablet (200 mg total) by mouth daily.   Ubrogepant (UBRELVY) 100 MG TABS Take 100 mg by mouth as needed.   No facility-administered medications prior to visit.    Review of Systems  Constitutional:  Negative for appetite change and fatigue.  Respiratory:  Negative for chest tightness and shortness of breath.   Cardiovascular:  Negative for chest pain.  Gastrointestinal:  Negative for abdominal pain.  Psychiatric/Behavioral:  Negative for agitation, decreased concentration and sleep disturbance. The patient is not nervous/anxious.     {Labs  Heme  Chem  Endocrine  Serology  Results Review (optional):23779}    Objective    There were no vitals taken for this visit. {Show previous vital signs (optional):23777}  Physical Exam  ***  No results found for any visits on 03/16/22.  Assessment & Plan     ***  No follow-ups on file.      {provider attestation***:1}   Lavon Paganini, MD  [facility] [phone] (phone) [phone] (fax)  [place]

## 2022-03-16 ENCOUNTER — Ambulatory Visit: Payer: Medicaid Other | Admitting: Family Medicine

## 2022-03-16 ENCOUNTER — Encounter: Payer: Self-pay | Admitting: Family Medicine

## 2022-03-16 VITALS — BP 120/79 | HR 79 | Temp 98.6°F | Resp 16 | Ht 64.0 in | Wt 190.6 lb

## 2022-03-16 DIAGNOSIS — M62838 Other muscle spasm: Secondary | ICD-10-CM | POA: Diagnosis not present

## 2022-03-16 DIAGNOSIS — F4323 Adjustment disorder with mixed anxiety and depressed mood: Secondary | ICD-10-CM

## 2022-03-16 MED ORDER — TIZANIDINE HCL 4 MG PO TABS: 8.0000 mg | ORAL_TABLET | Freq: Every day | ORAL | 5 refills | Status: DC

## 2022-03-16 NOTE — Assessment & Plan Note (Addendum)
Stable Getting to more of an acceptance place Related to stressors Has had adverse effects from SSRIs, cymbalta, and wellbutrin - now also with nortriptyline Consider amitriptyline (tolerated well in the past) vs buspar Reports she was on benzos in the past - would not consider this for a long term solution Will stay off meds for now Encourage therapy

## 2022-03-31 ENCOUNTER — Ambulatory Visit: Payer: Medicaid Other | Admitting: Obstetrics and Gynecology

## 2022-04-10 ENCOUNTER — Ambulatory Visit
Admission: RE | Admit: 2022-04-10 | Discharge: 2022-04-10 | Disposition: A | Discharge status: Home / Self Care | Payer: Medicaid Other | Source: Ambulatory Visit

## 2022-04-10 ENCOUNTER — Ambulatory Visit (INDEPENDENT_AMBULATORY_CARE_PROVIDER_SITE_OTHER): Payer: Medicaid Other

## 2022-04-10 VITALS — BP 146/90 | HR 112 | Temp 99.6°F | Resp 16

## 2022-04-10 DIAGNOSIS — R509 Fever, unspecified: Secondary | ICD-10-CM

## 2022-04-10 DIAGNOSIS — R059 Cough, unspecified: Secondary | ICD-10-CM

## 2022-04-10 DIAGNOSIS — R0989 Other specified symptoms and signs involving the circulatory and respiratory systems: Secondary | ICD-10-CM | POA: Diagnosis not present

## 2022-04-10 DIAGNOSIS — J208 Acute bronchitis due to other specified organisms: Secondary | ICD-10-CM | POA: Diagnosis not present

## 2022-04-10 DIAGNOSIS — J45901 Unspecified asthma with (acute) exacerbation: Secondary | ICD-10-CM | POA: Diagnosis not present

## 2022-04-10 DIAGNOSIS — J329 Chronic sinusitis, unspecified: Secondary | ICD-10-CM

## 2022-04-10 MED ORDER — IPRATROPIUM BROMIDE 0.06 % NA SOLN: 2.0000 | Freq: Three times a day (TID) | NASAL | 1 refills | Status: DC

## 2022-04-10 MED ORDER — ALBUTEROL SULFATE (2.5 MG/3ML) 0.083% IN NEBU
2.5000 mg | INHALATION_SOLUTION | Freq: Once | RESPIRATORY_TRACT | Status: AC
  Administered 2022-04-10: 2.5 mg via RESPIRATORY_TRACT

## 2022-04-10 MED ORDER — CETIRIZINE HCL 10 MG PO TABS: 10.0000 mg | ORAL_TABLET | Freq: Every day | ORAL | 1 refills | Status: AC

## 2022-04-10 MED ORDER — VENTOLIN HFA 108 (90 BASE) MCG/ACT IN AERS: 2.0000 | INHALATION_SPRAY | RESPIRATORY_TRACT | 2 refills | Status: DC | PRN

## 2022-04-10 MED ORDER — PROMETHAZINE-DM 6.25-15 MG/5ML PO SYRP: 5.0000 mL | ORAL_SOLUTION | Freq: Every evening | ORAL | 0 refills | Status: DC | PRN

## 2022-04-10 MED ORDER — PREDNISONE 10 MG PO TABS: 40.0000 mg | ORAL_TABLET | Freq: Every day | ORAL | 0 refills | Status: AC

## 2022-04-10 MED ORDER — METHYLPREDNISOLONE SODIUM SUCC 125 MG IJ SOLR
80.0000 mg | Freq: Once | INTRAMUSCULAR | Status: AC
  Administered 2022-04-10: 80 mg via INTRAMUSCULAR

## 2022-04-10 NOTE — ED Provider Notes (Incomplete)
[facility]    CSN: [id number] Arrival date & time: 04/10/22  1813    HISTORY   Chief Complaint  Patient presents with   Cough    Cough, fever, nasal congestion - Entered by patient   Fever   Nasal Congestion   Otalgia   Ear Pain   HPI Sharon King is a pleasant, 36 y.o. female who presents to urgent care today. Patient complains of nonproductive cough, subjective fever, nasal congestion for the past 5 days.  Patient states today she noticed blood tinged sputum after coughing.  Patient has an elevated temperature on arrival with elevated blood pressure and elevated heart rate.  The history is provided by the patient.   Past Medical History:  Diagnosis Date   Acute upper GI bleed 08/02/2019   Allergy    Anxiety    Asthma    Cancer ([place])    GERD (gastroesophageal reflux disease)    History of preterm delivery    Migraines    Neuromuscular disorder ([place])    Seizures ([place])    Last seizure 2014   Sleep apnea    Trigeminal neuralgia    Patient Active Problem List   Diagnosis Date Noted   Adjustment disorder with mixed anxiety and depressed mood 01/20/2022   Urticaria 12/22/2019   Chronic migraine without aura 06/23/2019   Muscle spasm 02/18/2016   ALLERGIC RHINITIS 04/22/2007   Asthma 11/06/2006   Migraines 11/06/2006   Past Surgical History:  Procedure Laterality Date   BREAST ENHANCEMENT SURGERY  2008   BREAST SURGERY     CESAREAN SECTION N/A 06/13/2015   Procedure: CESAREAN SECTION;  Surgeon: Lavonia Drafts, MD;  Location: [facility];  Service: Obstetrics;  Laterality: N/A;   COSMETIC SURGERY     ESOPHAGOGASTRODUODENOSCOPY (EGD) WITH PROPOFOL N/A 08/30/2019   Procedure: ESOPHAGOGASTRODUODENOSCOPY (EGD) WITH PROPOFOL;  Surgeon: Virgel Manifold, MD;  Location: [facility];  Service: Endoscopy;  Laterality: N/A;   wisdom teeth     OB History     Gravida  1   Para  1   Term      Preterm  1   AB      Living  1       SAB      IAB      Ectopic      Multiple  0   Live Births  1          Home Medications    Prior to Admission medications   Medication Sig Start Date End Date Taking? Authorizing Provider  cetirizine (ZYRTEC) 10 MG tablet Take 1 tablet (10 mg total) by mouth daily. Please schedule office visit before any future refill. 07/16/21   Gwyneth Sprout, FNP  EPINEPHrine 0.3 mg/0.3 mL IJ SOAJ injection Inject 0.3 mg into the muscle as needed for anaphylaxis. 12/26/19   Trinna Post, PA-C  famotidine (PEPCID) 20 MG tablet Take 1 tablet (20 mg total) by mouth 2 (two) times daily. 12/26/19 03/16/22  Trinna Post, PA-C  hydrOXYzine (ATARAX/VISTARIL) 25 MG tablet TAKE ONE TABLET BY MOUTH EVERY 6 HOURS AS NEEDED FOR ITCHING 06/13/20   Carles Collet M, PA-C  ketorolac (TORADOL) 10 MG tablet Take 1 tablet (10 mg total) by mouth every 6 (six) hours as needed. Patient not taking: Reported on 03/16/2022 06/26/21   Jaclyn Prime, Collene Leyden, PA-C  Lasmiditan Succinate (REYVOW) 100 MG TABS Take 100 mg by mouth as needed. 02/27/22   Jaclyn Prime, Collene Leyden,  PA-C  metoprolol tartrate (LOPRESSOR) 25 MG tablet Take 1 tablet (25 mg total) by mouth daily. 02/27/22   Jaclyn Prime, Collene Leyden, PA-C  omalizumab Arvid Right) 150 MG/ML prefilled syringe Inject into the skin every 14 (fourteen) days.    [provider]  [place] 28 0.25-35 MG-MCG tablet TAKE 1 TABLET DAILY. TAKE ACTIVE PILLS IN A CONTINUOUS FASHION. HAVE A WITHDRAWAL BLEED EVERY 3-6 MONTHS 10/28/21   Anyanwu, Sallyanne Havers, MD  tiZANidine (ZANAFLEX) 4 MG tablet Take 2 tablets (8 mg total) by mouth at bedtime. 03/16/22   Virginia Crews, MD  topiramate (TOPAMAX) 200 MG tablet Take 1 tablet (200 mg total) by mouth daily. 02/27/22   Teague Carlis Abbott, Collene Leyden, PA-C  Ubrogepant (UBRELVY) 100 MG TABS Take 100 mg by mouth as needed. 02/27/22   Paticia Stack, PA-C    Family History Family History  Problem Relation Age of Onset   Hyperlipidemia Mother     Arthritis Mother    Depression Mother    Arthritis Father    Depression Father    Alcohol abuse Father    Social History Social History   Tobacco Use   Smoking status: Former   Smokeless tobacco: Former  Scientific laboratory technician Use: Never used  Substance Use Topics   Alcohol use: No    Comment: rare-stopped with pregnancy   Drug use: No   Allergies   Eggs or egg-derived products, Erythromycin, Paroxetine, and Sulfonamide derivatives  Review of Systems Review of Systems Pertinent findings revealed after performing a 14 point review of systems has been noted in the history of present illness.  Physical Exam Triage Vital Signs ED Triage Vitals  Enc Vitals Group     BP 02/07/21 0827 (!) 147/82     Pulse Rate 02/07/21 0827 72     Resp 02/07/21 0827 18     Temp 02/07/21 0827 98.3 F (36.8 C)     Temp Source 02/07/21 0827 Oral     SpO2 02/07/21 0827 98 %     Weight --      Height --      Head Circumference --      Peak Flow --      Pain Score 02/07/21 0826 5     Pain Loc --      Pain Edu? --      Excl. in [place]? --   No data found.  Updated Vital Signs BP (!) 146/90 (BP Location: Right Arm)   Pulse (!) 112   Temp 99.6 F (37.6 C) (Oral)   Resp 16   SpO2 96%   Physical Exam Vitals and nursing note reviewed.  Constitutional:      General: She is not in acute distress.    Appearance: Normal appearance. She is not ill-appearing.  HENT:     Head: Normocephalic and atraumatic.     Salivary Glands: Right salivary gland is not diffusely enlarged or tender. Left salivary gland is not diffusely enlarged or tender.     Right Ear: Tympanic membrane, ear canal and external ear normal. No drainage. No middle ear effusion. There is no impacted cerumen. Tympanic membrane is not erythematous or bulging.     Left Ear: Tympanic membrane, ear canal and external ear normal. No drainage.  No middle ear effusion. There is no impacted cerumen. Tympanic membrane is not erythematous or  bulging.     Nose: Nose normal. No nasal deformity, septal deviation, mucosal edema, congestion or rhinorrhea.  Right Turbinates: Not enlarged, swollen or pale.     Left Turbinates: Not enlarged, swollen or pale.     Right Sinus: No maxillary sinus tenderness or frontal sinus tenderness.     Left Sinus: No maxillary sinus tenderness or frontal sinus tenderness.     Mouth/Throat:     Lips: Pink. No lesions.     Mouth: Mucous membranes are moist. No oral lesions.     Pharynx: Oropharynx is clear. Uvula midline. No posterior oropharyngeal erythema or uvula swelling.     Tonsils: No tonsillar exudate. 0 on the right. 0 on the left.  Eyes:     General: Lids are normal.        Right eye: No discharge.        Left eye: No discharge.     Extraocular Movements: Extraocular movements intact.     Conjunctiva/sclera: Conjunctivae normal.     Right eye: Right conjunctiva is not injected.     Left eye: Left conjunctiva is not injected.  Neck:     Trachea: Trachea and phonation normal.  Cardiovascular:     Rate and Rhythm: Normal rate and regular rhythm.     Pulses: Normal pulses.     Heart sounds: Normal heart sounds. No murmur heard.    No friction rub. No gallop.  Pulmonary:     Effort: Pulmonary effort is normal. No tachypnea, bradypnea, accessory muscle usage, prolonged expiration, respiratory distress or retractions.     Breath sounds: No stridor, decreased air movement or transmitted upper airway sounds. Examination of the right-upper field reveals wheezing. Examination of the left-upper field reveals wheezing. Examination of the right-middle field reveals wheezing. Examination of the left-middle field reveals wheezing. Wheezing present. No decreased breath sounds, rhonchi or rales.     Comments: Significant bronchospasm with cough Chest:     Chest wall: No tenderness.  Musculoskeletal:        General: Normal range of motion.     Cervical back: Normal range of motion and neck supple.  Normal range of motion.  Lymphadenopathy:     Cervical: No cervical adenopathy.  Skin:    General: Skin is warm and dry.     Findings: No erythema or rash.  Neurological:     General: No focal deficit present.     Mental Status: She is alert and oriented to person, place, and time.  Psychiatric:        Mood and Affect: Mood normal.        Behavior: Behavior normal.     Visual Acuity Right Eye Distance:   Left Eye Distance:   Bilateral Distance:    Right Eye Near:   Left Eye Near:    Bilateral Near:     [facility] Couse / Diagnostics / Procedures:     Radiology DG Chest 2 View  Result Date: 04/10/2022 CLINICAL DATA:  Blood-tinged sputum.  Cough, fever, and congestion. EXAM: CHEST - 2 VIEW COMPARISON:  Chest two views 06/12/2019, AP chest 11/23/2012 FINDINGS: Cardiac silhouette and mediastinal contours are within normal limits. The lungs are clear. No pleural effusion pneumothorax. No acute skeletal abnormality. IMPRESSION: No active cardiopulmonary disease. Electronically Signed   By: Yvonne Kendall M.D.   On: 04/10/2022 19:38    Procedures Procedures (including critical care time) EKG  Pending results:  Labs Reviewed - No data to display  Medications Ordered in UC: Medications  methylPREDNISolone sodium succinate (SOLU-MEDROL) 125 mg/2 mL injection 80 mg (has no administration in time range)  albuterol (PROVENTIL) (2.5 MG/3ML) 0.083% nebulizer solution 2.5 mg (2.5 mg Nebulization Given 04/10/22 1945)    UC Diagnoses / Final Clinical Impressions(s)   I have reviewed the triage vital signs and the nursing notes.  Pertinent labs & imaging results that were available during my care of the patient were reviewed by me and considered in my medical decision making (see chart for details).    Final diagnoses:  Reactive airway disease with acute exacerbation, unspecified asthma severity, unspecified whether persistent  Rhinosinusitis  Acute viral bronchitis   *** Please see  discharge instructions below for further details of plan of care as provided to patient. ED Prescriptions     Medication Sig Dispense Auth. Provider   albuterol (VENTOLIN HFA) 108 (90 Base) MCG/ACT inhaler Inhale 2 puffs into the lungs every 4 (four) hours as needed for wheezing or shortness of breath. 36 g Lynden Oxford Scales, PA-C   cetirizine (ZYRTEC ALLERGY) 10 MG tablet Take 1 tablet (10 mg total) by mouth at bedtime. 90 tablet Lynden Oxford Scales, PA-C   ipratropium (ATROVENT) 0.06 % nasal spray Place 2 sprays into both nostrils 3 (three) times daily. As needed for nasal congestion, runny nose 15 mL Lynden Oxford Scales, PA-C   predniSONE (DELTASONE) 10 MG tablet Take 4 tablets (40 mg total) by mouth daily with breakfast for 3 days. 12 tablet Lynden Oxford Scales, PA-C   promethazine-dextromethorphan (PROMETHAZINE-DM) 6.25-15 MG/5ML syrup Take 5 mLs by mouth at bedtime as needed for cough. 60 mL Lynden Oxford Scales, PA-C      PDMP not reviewed this encounter.  Disposition Upon Discharge:  Condition: stable for discharge home Home: take medications as prescribed; routine discharge instructions as discussed; follow up as advised.  Patient presented with an acute illness with associated systemic symptoms and significant discomfort requiring urgent management. In my opinion, this is a condition that a prudent lay person (someone who possesses an average knowledge of health and medicine) may potentially expect to result in complications if not addressed urgently such as respiratory distress, impairment of bodily function or dysfunction of bodily organs.   Routine symptom specific, illness specific and/or disease specific instructions were discussed with the patient and/or caregiver at length.   As such, the patient has been evaluated and assessed, work-up was performed and treatment was provided in alignment with urgent care protocols and evidence based medicine.   Patient/parent/caregiver has been advised that the patient may require follow up for further testing and treatment if the symptoms continue in spite of treatment, as clinically indicated and appropriate.  If the patient was tested for COVID-19, Influenza and/or RSV, then the patient/parent/guardian was advised to isolate at home pending the results of his/her diagnostic coronavirus test and potentially longer if they're positive. I have also advised pt that if his/her COVID-19 test returns positive, it's recommended to self-isolate for at least 10 days after symptoms first appeared AND until fever-free for 24 hours without fever reducer AND other symptoms have improved or resolved. Discussed self-isolation recommendations as well as instructions for household member/close contacts as per the [facility] and [place] DHHS, and also gave patient the [place] packet with this information.  Patient/parent/caregiver has been advised to return to the [facility] or PCP in 3-5 days if no better; to PCP or the Emergency Department if new signs and symptoms develop, or if the current signs or symptoms continue to change or worsen for further workup, evaluation and treatment as clinically indicated and appropriate  The patient will follow up  with their current PCP if and as advised. If the patient does not currently have a PCP we will assist them in obtaining one.   The patient may need specialty follow up if the symptoms continue, in spite of conservative treatment and management, for further workup, evaluation, consultation and treatment as clinically indicated and appropriate.  Patient/parent/caregiver verbalized understanding and agreement of plan as discussed.  All questions were addressed during visit.  Please see discharge instructions below for further details of plan.  Discharge Instructions:   Discharge Instructions      Your chest x-ray was not concerning for pneumonia.  I believe that the blood in your sputum is related  to severe cough.  Please read below to learn more about the medications, dosages and frequencies that I recommend to help alleviate your symptoms and to get you feeling better soon:   Solu-Medrol IM (methylprednisolone):  To quickly address your significant respiratory inflammation, you were provided with an injection of Solu-Medrol in the office today.  You should continue to feel the full benefit of the steroid for the next 4 to 6 hours.    Prednisone: This is a steroid that will significantly continue to calm your upper and lower airways if needed.  Please take the daily recommended quantity of tablets daily with your breakfast meal starting tomorrow morning until the prescription is complete.  If you find that the steroid injection resolved your chest tightness, cough and feelings of wheezing, you do not need to take this oral dose of steroid.   Zyrtec (cetirizine): This is an excellent second-generation antihistamine that helps to reduce respiratory inflammatory response to environmental allergens.  In some patients, this medication can cause daytime sleepiness so I recommend that you take 1 tablet daily at bedtime.     Atrovent (ipratropium): This is an excellent nasal decongestant spray I have added to your recommended nasal steroid that will not cause rebound congestion, please instill 2 sprays into each nare with each use.  Because nasal steroids can take several days before they begin to provide full benefit, I recommend that you use this spray in addition to the nasal steroid prescribed for you.  Please use it after you have used your nasal steroid and repeat up to 4 times daily as needed.  I have provided you with a prescription for this medication.      ProAir, Ventolin, Proventil (albuterol): This inhaled medication contains a short acting beta agonist bronchodilator.  This medication works on the smooth muscle that opens and constricts of your airways by relaxing the muscle.  The result of  relaxation of the smooth muscle is increased air movement and improved work of breathing.  This is a short acting medication that can be used every 4-6 hours as needed for increased work of breathing, shortness of breath, wheezing and excessive coughing.  I have provided you with a prescription.    Promethazine DM: Promethazine is both a nasal decongestant and an antinausea medication that makes most patients feel fairly sleepy.  The DM is dextromethorphan, a cough suppressant found in many over-the-counter cough medications.  Please take 5 mL before bedtime to minimize your cough which will help you sleep better.  I have sent a prescription for this medication to your pharmacy.   Please follow-up within the next 5-7 days either with your primary care provider or urgent care if your symptoms do not resolve.  If you do not have a primary care provider, we will assist you in finding  one.        Thank you for visiting urgent care today.  We appreciate the opportunity to participate in your care.        This office note has been dictated using Museum/gallery curator.  Unfortunately, this method of dictation can sometimes lead to typographical or grammatical errors.  I apologize for your inconvenience in advance if this occurs.  Please do not hesitate to reach out to me if clarification is needed.

## 2022-04-10 NOTE — Discharge Instructions (Addendum)
Your chest x-ray was not concerning for pneumonia.  I believe that the blood in your sputum is related to severe cough.  Please read below to learn more about the medications, dosages and frequencies that I recommend to help alleviate your symptoms and to get you feeling better soon:   Solu-Medrol IM (methylprednisolone):  To quickly address your significant respiratory inflammation, you were provided with an injection of Solu-Medrol in the office today.  You should continue to feel the full benefit of the steroid for the next 4 to 6 hours.    Prednisone: This is a steroid that will significantly continue to calm your upper and lower airways if needed.  Please take the daily recommended quantity of tablets daily with your breakfast meal starting tomorrow morning until the prescription is complete.  If you find that the steroid injection resolved your chest tightness, cough and feelings of wheezing, you do not need to take this oral dose of steroid.   Zyrtec (cetirizine): This is an excellent second-generation antihistamine that helps to reduce respiratory inflammatory response to environmental allergens.  In some patients, this medication can cause daytime sleepiness so I recommend that you take 1 tablet daily at bedtime.     Atrovent (ipratropium): This is an excellent nasal decongestant spray I have added to your recommended nasal steroid that will not cause rebound congestion, please instill 2 sprays into each nare with each use.  Because nasal steroids can take several days before they begin to provide full benefit, I recommend that you use this spray in addition to the nasal steroid prescribed for you.  Please use it after you have used your nasal steroid and repeat up to 4 times daily as needed.  I have provided you with a prescription for this medication.      ProAir, Ventolin, Proventil (albuterol): This inhaled medication contains a short acting beta agonist bronchodilator.  This medication  works on the smooth muscle that opens and constricts of your airways by relaxing the muscle.  The result of relaxation of the smooth muscle is increased air movement and improved work of breathing.  This is a short acting medication that can be used every 4-6 hours as needed for increased work of breathing, shortness of breath, wheezing and excessive coughing.  I have provided you with a prescription.    Promethazine DM: Promethazine is both a nasal decongestant and an antinausea medication that makes most patients feel fairly sleepy.  The DM is dextromethorphan, a cough suppressant found in many over-the-counter cough medications.  Please take 5 mL before bedtime to minimize your cough which will help you sleep better.  I have sent a prescription for this medication to your pharmacy.   Please follow-up within the next 5-7 days either with your primary care provider or urgent care if your symptoms do not resolve.  If you do not have a primary care provider, we will assist you in finding one.        Thank you for visiting urgent care today.  We appreciate the opportunity to participate in your care.

## 2022-04-10 NOTE — ED Triage Notes (Signed)
Pt c/o cough, fever and congestion. The patient states she noticed blood in her mucous when coughing.  Started: 04/05/2022  Home interventions: OTC mediations   At home covid test was negative 12/24.

## 2022-04-22 ENCOUNTER — Encounter: Payer: Self-pay | Admitting: Family Medicine

## 2022-04-23 ENCOUNTER — Telehealth (INDEPENDENT_AMBULATORY_CARE_PROVIDER_SITE_OTHER): Payer: Medicaid Other | Admitting: Family Medicine

## 2022-04-23 DIAGNOSIS — F430 Acute stress reaction: Secondary | ICD-10-CM

## 2022-04-23 DIAGNOSIS — F4321 Adjustment disorder with depressed mood: Secondary | ICD-10-CM | POA: Diagnosis not present

## 2022-04-23 MED ORDER — ALPRAZOLAM 0.5 MG PO TABS: 0.2500 mg | ORAL_TABLET | Freq: Two times a day (BID) | ORAL | 0 refills | Status: DC | PRN

## 2022-04-23 NOTE — Telephone Encounter (Signed)
Called pt and left VM to call back and let us know if she can do a virtual appt at 1pm.  Will also send mychart message

## 2022-04-23 NOTE — Telephone Encounter (Signed)
Can we double book my 1pm today with a virtual visit to discuss options and get her something Rx'd?

## 2022-04-23 NOTE — Progress Notes (Signed)
I,Sulibeya S Dimas,acting as a Education administrator for Lavon Paganini, MD.,have documented all relevant documentation on the behalf of Lavon Paganini, MD,as directed by  Lavon Paganini, MD while in the presence of Lavon Paganini, MD.   MyChart Video Visit    Virtual Visit via Video Note   This format is felt to be most appropriate for this patient at this time. Physical exam was limited by quality of the video and audio technology used for the visit.    Patient location: hospital Provider location: [place] involved in the visit: patient, provider   I discussed the limitations of evaluation and management by telemedicine and the availability of in person appointments. The patient expressed understanding and agreed to proceed.  Patient: Sharon King   DOB: [birth year]   37 y.o. Female  MRN: [id number] Visit Date: 04/23/2022  Today's healthcare provider: Lavon Paganini, MD   Chief Complaint  Patient presents with   Anxiety   Subjective    HPI  Patient C/O of anxiety due to her fathers health. He just passed away. She is grieving heavily. She has taken Xanax previously and tolerated well   Medications: Outpatient Medications Prior to Visit  Medication Sig   albuterol (VENTOLIN HFA) 108 (90 Base) MCG/ACT inhaler Inhale 2 puffs into the lungs every 4 (four) hours as needed for wheezing or shortness of breath.   cetirizine (ZYRTEC ALLERGY) 10 MG tablet Take 1 tablet (10 mg total) by mouth at bedtime.   EPINEPHrine 0.3 mg/0.3 mL IJ SOAJ injection Inject 0.3 mg into the muscle as needed for anaphylaxis.   famotidine (PEPCID) 20 MG tablet Take 1 tablet (20 mg total) by mouth 2 (two) times daily.   hydrOXYzine (ATARAX/VISTARIL) 25 MG tablet TAKE ONE TABLET BY MOUTH EVERY 6 HOURS AS NEEDED FOR ITCHING   ipratropium (ATROVENT) 0.06 % nasal spray Place 2 sprays into both nostrils 3 (three) times daily. As needed for nasal congestion, runny nose    ketorolac (TORADOL) 10 MG tablet Take 1 tablet (10 mg total) by mouth every 6 (six) hours as needed. (Patient not taking: Reported on 03/16/2022)   Lasmiditan Succinate (REYVOW) 100 MG TABS Take 100 mg by mouth as needed.   metoprolol tartrate (LOPRESSOR) 25 MG tablet Take 1 tablet (25 mg total) by mouth daily.   montelukast (SINGULAIR) 10 MG tablet 1 tablet Orally Once a day for 30 days   omalizumab Arvid Right) 150 MG/ML prefilled syringe Inject into the skin every 14 (fourteen) days.   promethazine-dextromethorphan (PROMETHAZINE-DM) 6.25-15 MG/5ML syrup Take 5 mLs by mouth at bedtime as needed for cough.   SPRINTEC 28 0.25-35 MG-MCG tablet TAKE 1 TABLET DAILY. TAKE ACTIVE PILLS IN A CONTINUOUS FASHION. HAVE A WITHDRAWAL BLEED EVERY 3-6 MONTHS   tiZANidine (ZANAFLEX) 4 MG tablet Take 2 tablets (8 mg total) by mouth at bedtime.   topiramate (TOPAMAX) 200 MG tablet Take 1 tablet (200 mg total) by mouth daily.   Ubrogepant (UBRELVY) 100 MG TABS Take 100 mg by mouth as needed.   No facility-administered medications prior to visit.    Review of Systems     Objective    There were no vitals taken for this visit.     Physical Exam Constitutional:      General: She is not in acute distress.    Appearance: Normal appearance.  HENT:     Head: Normocephalic.  Pulmonary:     Effort: Pulmonary effort is normal. No respiratory distress.  Neurological:  Mental Status: She is alert and oriented to person, place, and time. Mental status is at baseline.  Psychiatric:        Mood and Affect: Mood is anxious and depressed. Affect is tearful.        Assessment & Plan     1. Acute stress reaction 2. Grief - Father just died at time of this visit - she is very upset  - will use xanax prn in the short term for acute stress reaction  - can make f/u appt to discuss more options in the future - encourage therapy   Return if symptoms worsen or fail to improve.     I discussed the  assessment and treatment plan with the patient. The patient was provided an opportunity to ask questions and all were answered. The patient agreed with the plan and demonstrated an understanding of the instructions.   The patient was advised to call back or seek an in-person evaluation if the symptoms worsen or if the condition fails to improve as anticipated.  I, Lavon Paganini, MD, have reviewed all documentation for this visit. The documentation on 04/23/22 for the exam, diagnosis, procedures, and orders are all accurate and complete.   Jalesa Thien, Dionne Bucy, MD, MPH [facility]

## 2022-05-15 ENCOUNTER — Ambulatory Visit (INDEPENDENT_AMBULATORY_CARE_PROVIDER_SITE_OTHER): Payer: Medicaid Other | Admitting: Physician Assistant

## 2022-05-15 ENCOUNTER — Encounter: Payer: Self-pay | Admitting: Physician Assistant

## 2022-05-15 VITALS — BP 118/82 | HR 84 | Wt 193.0 lb

## 2022-05-15 DIAGNOSIS — G43009 Migraine without aura, not intractable, without status migrainosus: Secondary | ICD-10-CM | POA: Diagnosis not present

## 2022-05-15 DIAGNOSIS — G43709 Chronic migraine without aura, not intractable, without status migrainosus: Secondary | ICD-10-CM | POA: Diagnosis not present

## 2022-05-15 DIAGNOSIS — M62838 Other muscle spasm: Secondary | ICD-10-CM

## 2022-05-15 MED ORDER — METOPROLOL TARTRATE 25 MG PO TABS: 25.0000 mg | ORAL_TABLET | Freq: Every day | ORAL | 1 refills | Status: DC

## 2022-05-15 MED ORDER — UBRELVY 100 MG PO TABS: 100.0000 mg | ORAL_TABLET | ORAL | 1 refills | Status: DC | PRN

## 2022-05-15 MED ORDER — TOPIRAMATE 200 MG PO TABS: 200.0000 mg | ORAL_TABLET | Freq: Every day | ORAL | 1 refills | Status: DC

## 2022-05-15 MED ORDER — REYVOW 100 MG PO TABS: 100.0000 mg | ORAL_TABLET | ORAL | 3 refills | Status: DC | PRN

## 2022-05-15 NOTE — Progress Notes (Signed)
Migraine onset yesterday still has HA today.  Pt has been having HA's sice X-mas notes personal stressors  History:  Sharon King is a 37 y.o. G1P0101 who presents to clinic today for headache followup.  She reports yesterday was an awful migraine day and today is not much better.  She feels too badly to complete the intake questionnaire.  However she says her medications are working well for her and she wants to continue them without change.  The metoprolol has reduced headache frequency and severity.  She sometimes does not remember to use it daily.  She has no side effects from it.  She does not want to increase from the '25mg'$  dose.   She has been very stressed dealing with the death of her stepdad.  She is having to close out his estate as well her her grandmother's and her mother's, all that have not been handled.   Pt reports she has been unable to get Reyvow as her PA was not approved.   She reports 6 severe headache days and 10 mild headache days of the last 28 days.   Past Medical History:  Diagnosis Date   Acute upper GI bleed 08/02/2019   Allergy    Anxiety    Asthma    Cancer (HCC)    GERD (gastroesophageal reflux disease)    History of preterm delivery    Migraines    Neuromuscular disorder ([place])    Seizures ([place])    Last seizure 2014   Sleep apnea    Trigeminal neuralgia     Social History   Socioeconomic History   Marital status: Divorced    Spouse name: Not on file   Number of children: Not on file   Years of education: Not on file   Highest education level: Not on file  Occupational History   Not on file  Tobacco Use   Smoking status: Former   Smokeless tobacco: Former  Scientific laboratory technician Use: Never used  Substance and Sexual Activity   Alcohol use: No    Comment: rare-stopped with pregnancy   Drug use: No   Sexual activity: Not Currently    Birth control/protection: Pill  Other Topics Concern   Not on file  Social History Narrative   Not  on file   Social Determinants of Health   Financial Resource Strain: Not on file  Food Insecurity: Not on file  Transportation Needs: Not on file  Physical Activity: Not on file  Stress: Not on file  Social Connections: Not on file  Intimate Partner Violence: Not on file    Family History  Problem Relation Age of Onset   Hyperlipidemia Mother    Arthritis Mother    Depression Mother    Arthritis Father    Depression Father    Alcohol abuse Father     Allergies  Allergen Reactions   Eggs Or Egg-Derived Products Rash   Erythromycin Rash   Paroxetine Palpitations   Sulfonamide Derivatives Rash    Current Outpatient Medications on File Prior to Visit  Medication Sig Dispense Refill   ALPRAZolam (XANAX) 0.5 MG tablet Take 0.5-1 tablets (0.25-0.5 mg total) by mouth 2 (two) times daily as needed for anxiety. 20 tablet 0   cetirizine (ZYRTEC ALLERGY) 10 MG tablet Take 1 tablet (10 mg total) by mouth at bedtime. 90 tablet 1   EPINEPHrine 0.3 mg/0.3 mL IJ SOAJ injection Inject 0.3 mg into the muscle as needed for anaphylaxis. 1 each 1  hydrOXYzine (ATARAX/VISTARIL) 25 MG tablet TAKE ONE TABLET BY MOUTH EVERY 6 HOURS AS NEEDED FOR ITCHING 30 tablet 1   Lasmiditan Succinate (REYVOW) 100 MG TABS Take 100 mg by mouth as needed. 8 tablet 3   metoprolol tartrate (LOPRESSOR) 25 MG tablet Take 1 tablet (25 mg total) by mouth daily. 30 tablet 6   montelukast (SINGULAIR) 10 MG tablet 1 tablet Orally Once a day for 30 days     omalizumab Arvid Right) 150 MG/ML prefilled syringe Inject into the skin every 14 (fourteen) days.     promethazine-dextromethorphan (PROMETHAZINE-DM) 6.25-15 MG/5ML syrup Take 5 mLs by mouth at bedtime as needed for cough. 60 mL 0   SPRINTEC 28 0.25-35 MG-MCG tablet TAKE 1 TABLET DAILY. TAKE ACTIVE PILLS IN A CONTINUOUS FASHION. HAVE A WITHDRAWAL BLEED EVERY 3-6 MONTHS 84 tablet 4   tiZANidine (ZANAFLEX) 4 MG tablet Take 2 tablets (8 mg total) by mouth at bedtime. 60 tablet  5   topiramate (TOPAMAX) 200 MG tablet Take 1 tablet (200 mg total) by mouth daily. 30 tablet 6   Ubrogepant (UBRELVY) 100 MG TABS Take 100 mg by mouth as needed. 10 tablet 11   albuterol (VENTOLIN HFA) 108 (90 Base) MCG/ACT inhaler Inhale 2 puffs into the lungs every 4 (four) hours as needed for wheezing or shortness of breath. (Patient not taking: Reported on 05/15/2022) 36 g 2   famotidine (PEPCID) 20 MG tablet Take 1 tablet (20 mg total) by mouth 2 (two) times daily. 60 tablet 0   ipratropium (ATROVENT) 0.06 % nasal spray Place 2 sprays into both nostrils 3 (three) times daily. As needed for nasal congestion, runny nose (Patient not taking: Reported on 05/15/2022) 15 mL 1   ketorolac (TORADOL) 10 MG tablet Take 1 tablet (10 mg total) by mouth every 6 (six) hours as needed. (Patient not taking: Reported on 03/16/2022) 10 tablet 0   No current facility-administered medications on file prior to visit.     Review of Systems:  All pertinent positive/negative included in HPI, all other review of systems are negative   Objective:  Physical Exam BP 118/82   Pulse 84   Wt 193 lb (87.5 kg)   BMI 33.13 kg/m  CONSTITUTIONAL: Well-developed, well-nourished female in no acute distress.  EYES: EOM intact ENT: Normocephalic CARDIOVASCULAR: Regular rate  RESPIRATORY: Normal rate.  MUSCULOSKELETAL: Normal ROM SKIN: Warm, dry without erythema  NEUROLOGICAL: Alert, oriented, CN II-XII grossly intact, Appropriate balance PSYCH: Normal behavior, mood   Assessment & Plan:  Assessment: 1. Migraine without aura and without status migrainosus, not intractable   2. Chronic migraine without aura without status migrainosus, not intractable   3. Muscle spasm      Plan: Continue meds as previous.  Will trial for Reyvow rx again to see if it can be approved now.  Continue anxiety management with PCP.  Amitriptyline (mentioned in pcp note) if utilized, may help with migraine prevention also.   May consider  increasing Metoprolol if needed for migraine.   Follow-up in 4 months or sooner PRN  31 minutes of time spent directly with patient during this encounter.  Paticia Stack, PA-C 05/15/2022 9:05 AM

## 2022-05-20 ENCOUNTER — Other Ambulatory Visit: Payer: Self-pay | Admitting: *Deleted

## 2022-05-20 MED ORDER — UBRELVY 100 MG PO TABS: 100.0000 mg | ORAL_TABLET | ORAL | 1 refills | Status: AC | PRN

## 2022-07-02 DIAGNOSIS — L501 Idiopathic urticaria: Secondary | ICD-10-CM | POA: Diagnosis not present

## 2022-07-02 DIAGNOSIS — J301 Allergic rhinitis due to pollen: Secondary | ICD-10-CM | POA: Diagnosis not present

## 2022-07-02 DIAGNOSIS — J3081 Allergic rhinitis due to animal (cat) (dog) hair and dander: Secondary | ICD-10-CM | POA: Diagnosis not present

## 2022-07-02 DIAGNOSIS — J3089 Other allergic rhinitis: Secondary | ICD-10-CM | POA: Diagnosis not present

## 2022-07-02 IMAGING — CT CT RENAL STONE PROTOCOL
2 of 4 series · 16 of 46 positions shown, 18 images · non-contrast
Comparison: 08/02/2019

CLINICAL DATA: Flank pain and difficulty urinating, initial
encounter

EXAM:
CT ABDOMEN AND PELVIS WITHOUT CONTRAST
TECHNIQUE: Multidetector CT imaging of the abdomen and pelvis was performed
following the standard protocol without IV contrast.

[Series 2: stone full standard · axial · 0.80mm/px · z∈[-1168,-728]mm · 13 of 98 slices shown, 15 images]
[im 5/98  soft-tissue]
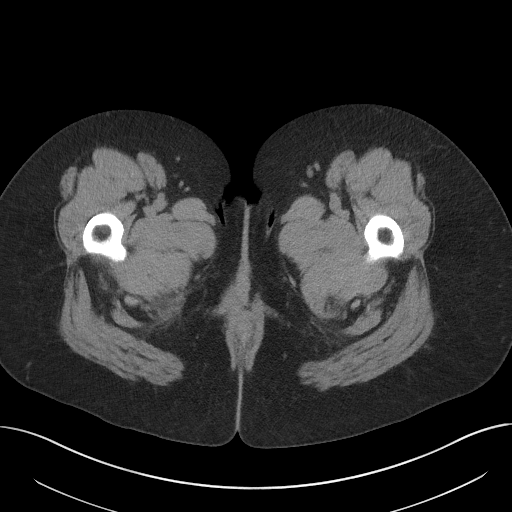
[im 5/98  bone]
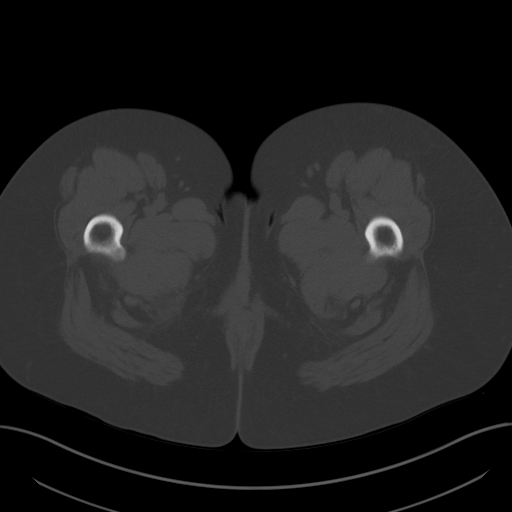
[im 13/98  soft-tissue]
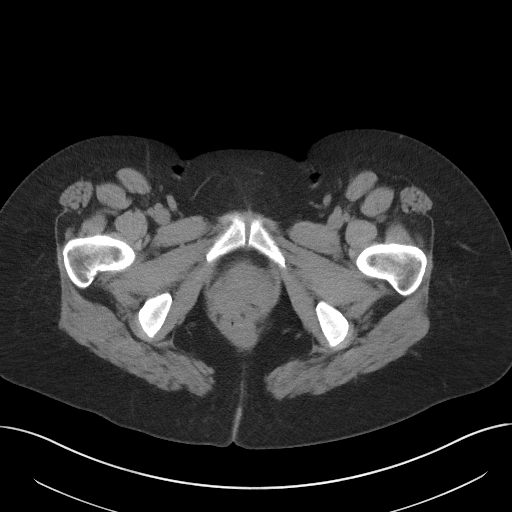
[im 22/98  soft-tissue]
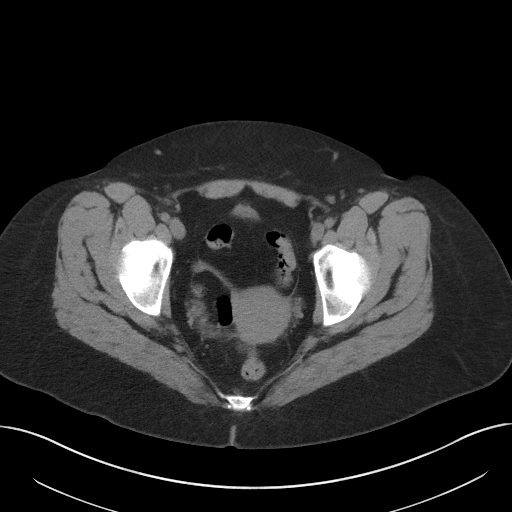
[im 26/98  soft-tissue]
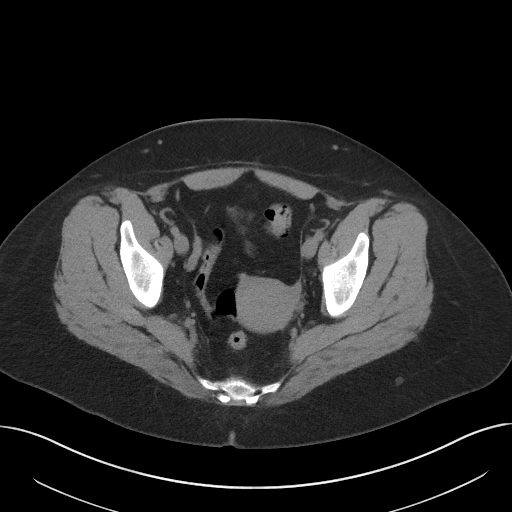
[im 34/98  soft-tissue]
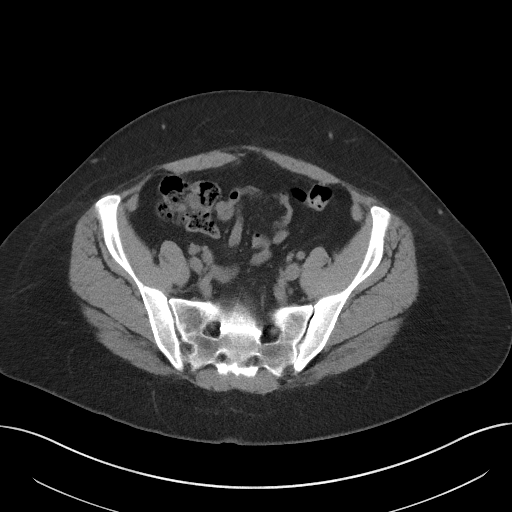
[im 43/98  soft-tissue]
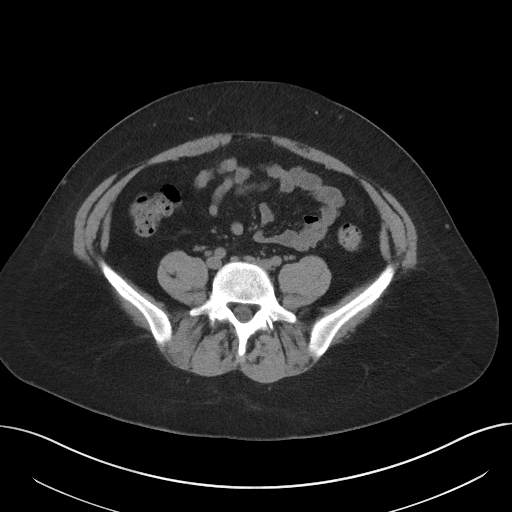
[im 51/98  soft-tissue]
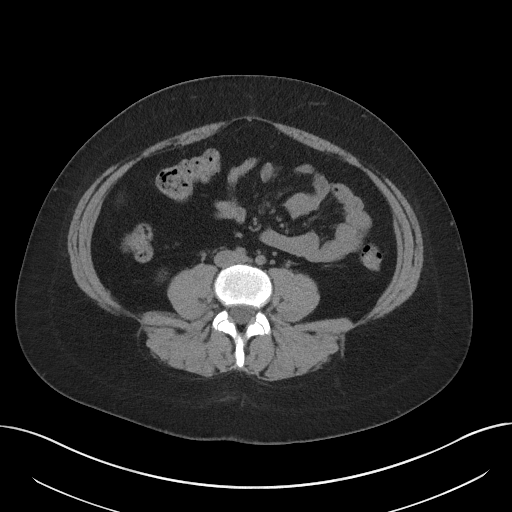
[im 55/98  soft-tissue]
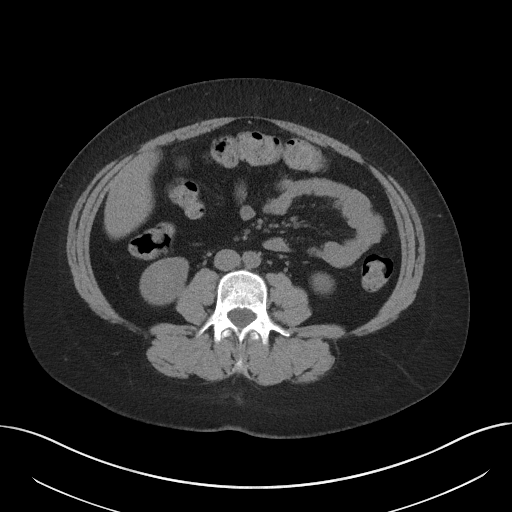
[im 64/98  soft-tissue]
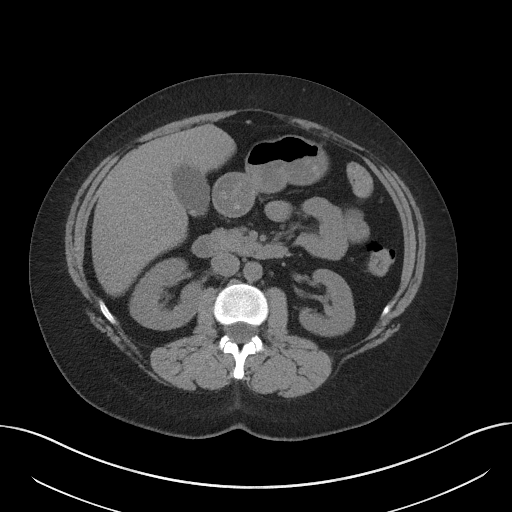
[im 64/98  bone]
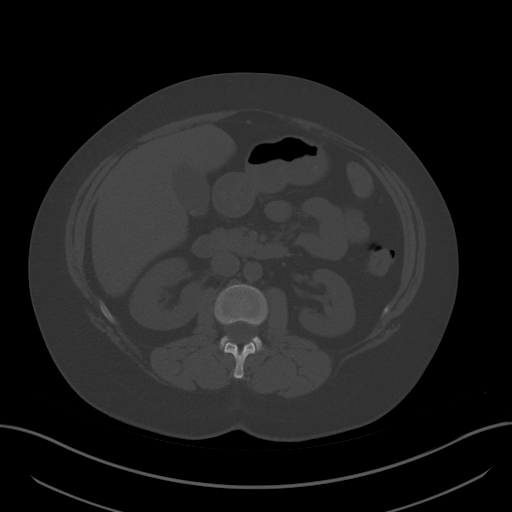
[im 72/98  soft-tissue]
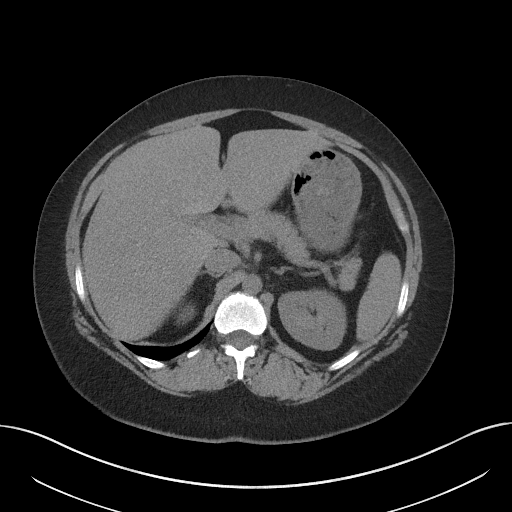
[im 76/98  soft-tissue]
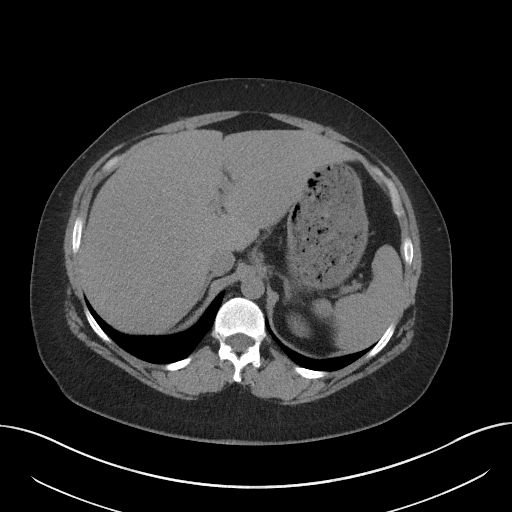
[im 85/98  soft-tissue]
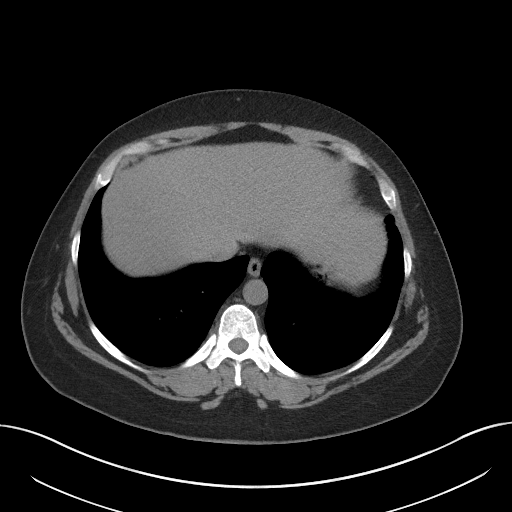
[im 93/98  soft-tissue]
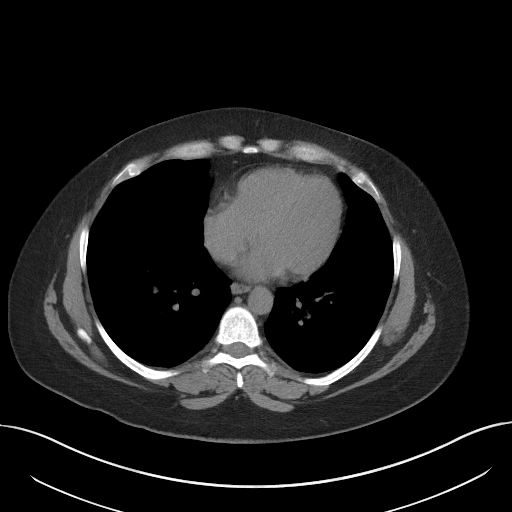

[Series 5: coronal · coronal · 0.68mm/px · 3 of 152 slices shown]
[im 51/152  soft-tissue]
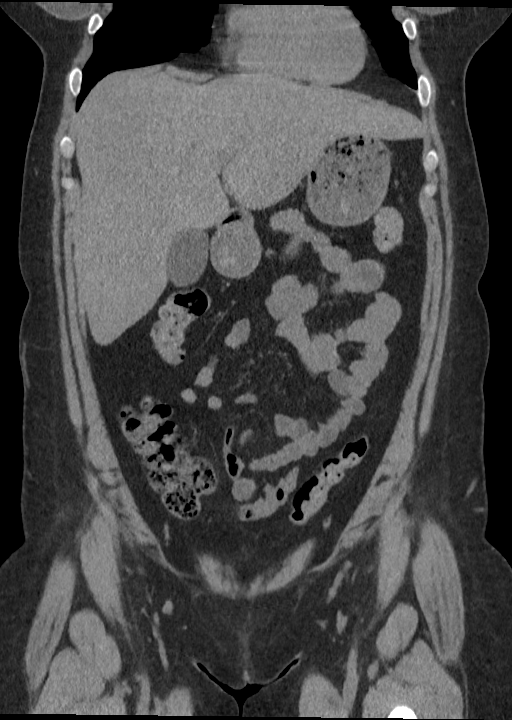
[im 68/152  soft-tissue]
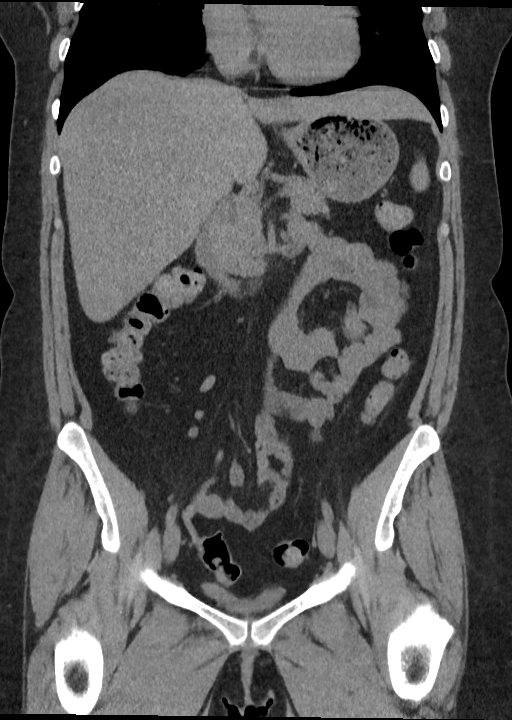
[im 84/152  soft-tissue]
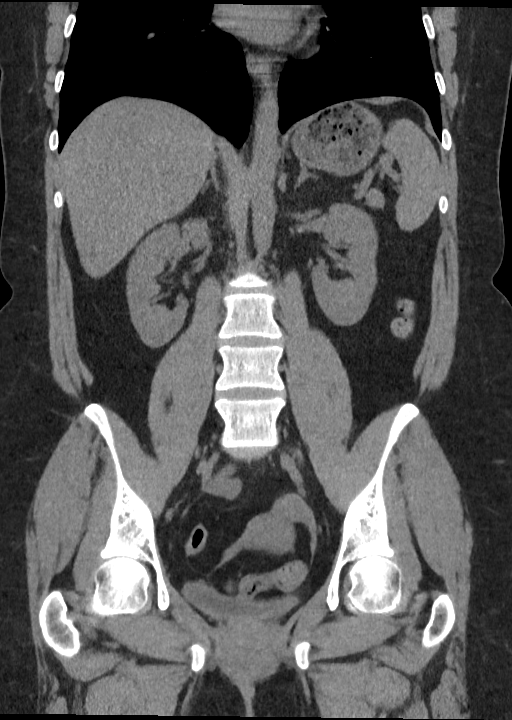

[16 of 46 positions shown; findings below may reference images not displayed]

FINDINGS: Lower chest: Lung bases are free of acute infiltrate or sizable
effusion. Bilateral breast implants are noted.

Hepatobiliary: Gallbladder is well distended with dependent
gallstones stable from the prior exam. Mild fatty infiltration of
the liver is noted.

Pancreas: Unremarkable. No pancreatic ductal dilatation or
surrounding inflammatory changes.

Spleen: Normal in size without focal abnormality.

Adrenals/Urinary Tract: Adrenal glands are within normal limits.
Kidneys demonstrate a normal appearance without renal calculi or
obstructive changes. Bladder is decompressed.

Stomach/Bowel: No obstructive or inflammatory changes of the colon
are noted. The appendix is within normal limits. Small bowel and
stomach are unremarkable.

Vascular/Lymphatic: No significant vascular findings are present. No
enlarged abdominal or pelvic lymph nodes.

Reproductive: Uterus and bilateral adnexa are unremarkable.

Other: No abdominal wall hernia or abnormality. No abdominopelvic
ascites.

Musculoskeletal: No acute or significant osseous findings.
IMPRESSION: No renal calculi or urinary tract obstructive changes.

Cholelithiasis without complicating factors.

Fatty liver.

## 2022-08-24 ENCOUNTER — Telehealth: Payer: Medicaid Other | Admitting: Physician Assistant

## 2022-08-24 DIAGNOSIS — J019 Acute sinusitis, unspecified: Secondary | ICD-10-CM | POA: Diagnosis not present

## 2022-08-24 DIAGNOSIS — B9689 Other specified bacterial agents as the cause of diseases classified elsewhere: Secondary | ICD-10-CM

## 2022-08-24 MED ORDER — AMOXICILLIN-POT CLAVULANATE 875-125 MG PO TABS: 1.0000 | ORAL_TABLET | Freq: Two times a day (BID) | ORAL | 0 refills | Status: DC

## 2022-08-24 MED ORDER — PROMETHAZINE-DM 6.25-15 MG/5ML PO SYRP: 5.0000 mL | ORAL_SOLUTION | Freq: Four times a day (QID) | ORAL | 0 refills | Status: DC | PRN

## 2022-08-24 MED ORDER — FLUTICASONE PROPIONATE 50 MCG/ACT NA SUSP: 2.0000 | Freq: Every day | NASAL | 0 refills | Status: DC

## 2022-08-24 NOTE — Progress Notes (Signed)
Virtual Visit Consent   Sharon King, you are scheduled for a virtual visit with a [facility] provider today. Just as with appointments in the office, your consent must be obtained to participate. Your consent will be active for this visit and any virtual visit you may have with one of our providers in the next 365 days. If you have a MyChart account, a copy of this consent can be sent to you electronically.  As this is a virtual visit, video technology does not allow for your provider to perform a traditional examination. This may limit your provider's ability to fully assess your condition. If your provider identifies any concerns that need to be evaluated in person or the need to arrange testing (such as labs, EKG, etc.), we will make arrangements to do so. Although advances in technology are sophisticated, we cannot ensure that it will always work on either your end or our end. If the connection with a video visit is poor, the visit may have to be switched to a telephone visit. With either a video or telephone visit, we are not always able to ensure that we have a secure connection.  By engaging in this virtual visit, you consent to the provision of healthcare and authorize for your insurance to be billed (if applicable) for the services provided during this visit. Depending on your insurance coverage, you may receive a charge related to this service.  I need to obtain your verbal consent now. Are you willing to proceed with your visit today? Sharon King has provided verbal consent on 08/24/2022 for a virtual visit (video or telephone). Sharon Loveless, PA-C  Date: 08/24/2022 8:41 AM  Virtual Visit via Video Note   I, Sharon King, connected with  Sharon King  ([id number], [birth year]) on 08/24/22 at  8:30 AM EDT by a video-enabled telemedicine application and verified that I am speaking with the correct person using two identifiers.  Location: Patient:  Virtual Visit Location Patient: Home Provider: Virtual Visit Location Provider: Home Office   I discussed the limitations of evaluation and management by telemedicine and the availability of in person appointments. The patient expressed understanding and agreed to proceed.    History of Present Illness: Sharon King is a 37 y.o. who identifies as a female who was assigned female at birth, and is being seen today for possible sinus infection.  HPI: Sinusitis This is a new problem. The current episode started today. The problem has been gradually worsening since onset. Associated symptoms include congestion, coughing, ear pain, headaches, a hoarse voice, sinus pressure and a sore throat (from drainage). (Swollen face) Past treatments include acetaminophen, oral decongestants and nasal decongestants. The treatment provided no relief.     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   Chronic migraine without aura 06/23/2019   Muscle spasm 02/18/2016   ALLERGIC RHINITIS 04/22/2007   Asthma 11/06/2006   Migraines 11/06/2006    Allergies:  Allergies  Allergen Reactions   Egg-Derived Products Rash   Erythromycin Rash   Paroxetine Palpitations   Sulfonamide Derivatives Rash   Medications:  Current Outpatient Medications:    amoxicillin-clavulanate (AUGMENTIN) 875-125 MG tablet, Take 1 tablet by mouth 2 (two) times daily., Disp: 20 tablet, Rfl: 0   fluticasone (FLONASE) 50 MCG/ACT nasal spray, Place 2 sprays into both nostrils daily., Disp: 16 g, Rfl: 0  promethazine-dextromethorphan (PROMETHAZINE-DM) 6.25-15 MG/5ML syrup, Take 5 mLs by mouth 4 (four) times daily as needed., Disp: 118 mL, Rfl: 0   albuterol (VENTOLIN HFA) 108 (90 Base) MCG/ACT inhaler, Inhale 2 puffs into the lungs every 4 (four) hours as needed for wheezing or shortness of breath. (Patient not taking:  Reported on 05/15/2022), Disp: 36 g, Rfl: 2   ALPRAZolam (XANAX) 0.5 MG tablet, Take 0.5-1 tablets (0.25-0.5 mg total) by mouth 2 (two) times daily as needed for anxiety., Disp: 20 tablet, Rfl: 0   cetirizine (ZYRTEC ALLERGY) 10 MG tablet, Take 1 tablet (10 mg total) by mouth at bedtime., Disp: 90 tablet, Rfl: 1   EPINEPHrine 0.3 mg/0.3 mL IJ SOAJ injection, Inject 0.3 mg into the muscle as needed for anaphylaxis., Disp: 1 each, Rfl: 1   famotidine (PEPCID) 20 MG tablet, Take 1 tablet (20 mg total) by mouth 2 (two) times daily., Disp: 60 tablet, Rfl: 0   hydrOXYzine (ATARAX/VISTARIL) 25 MG tablet, TAKE ONE TABLET BY MOUTH EVERY 6 HOURS AS NEEDED FOR ITCHING, Disp: 30 tablet, Rfl: 1   ipratropium (ATROVENT) 0.06 % nasal spray, Place 2 sprays into both nostrils 3 (three) times daily. As needed for nasal congestion, runny nose (Patient not taking: Reported on 05/15/2022), Disp: 15 mL, Rfl: 1   ketorolac (TORADOL) 10 MG tablet, Take 1 tablet (10 mg total) by mouth every 6 (six) hours as needed. (Patient not taking: Reported on 03/16/2022), Disp: 10 tablet, Rfl: 0   Lasmiditan Succinate (REYVOW) 100 MG TABS, Take 1 tablet (100 mg total) by mouth as needed., Disp: 8 tablet, Rfl: 3   metoprolol tartrate (LOPRESSOR) 25 MG tablet, Take 1 tablet (25 mg total) by mouth daily., Disp: 90 tablet, Rfl: 1   montelukast (SINGULAIR) 10 MG tablet, 1 tablet Orally Once a day for 30 days, Disp: , Rfl:    omalizumab (XOLAIR) 150 MG/ML prefilled syringe, Inject into the skin every 14 (fourteen) days., Disp: , Rfl:    SPRINTEC 28 0.25-35 MG-MCG tablet, TAKE 1 TABLET DAILY. TAKE ACTIVE PILLS IN A CONTINUOUS FASHION. HAVE A WITHDRAWAL BLEED EVERY 3-6 MONTHS, Disp: 84 tablet, Rfl: 4   tiZANidine (ZANAFLEX) 4 MG tablet, Take 2 tablets (8 mg total) by mouth at bedtime., Disp: 60 tablet, Rfl: 5   topiramate (TOPAMAX) 200 MG tablet, Take 1 tablet (200 mg total) by mouth daily., Disp: 90 tablet, Rfl: 1  Observations/Objective: Patient  is well-developed, well-nourished in no acute distress.  Resting comfortably at home.  Head is normocephalic, atraumatic.  No labored breathing.  Speech is clear and coherent with logical content.  Patient is alert and oriented at baseline.    Assessment and Plan: 1. Acute bacterial sinusitis - amoxicillin-clavulanate (AUGMENTIN) 875-125 MG tablet; Take 1 tablet by mouth 2 (two) times daily.  Dispense: 20 tablet; Refill: 0 - fluticasone (FLONASE) 50 MCG/ACT nasal spray; Place 2 sprays into both nostrils daily.  Dispense: 16 g; Refill: 0 - promethazine-dextromethorphan (PROMETHAZINE-DM) 6.25-15 MG/5ML syrup; Take 5 mLs by mouth 4 (four) times daily as needed.  Dispense: 118 mL; Refill: 0  - Worsening symptoms that have not responded to OTC medications.  - Will give Augmentin, Promethazine DM and Flonase  - Continue allergy medications.  - Steam and humidifier can help - Stay well hydrated and get plenty of rest.  - Seek in person evaluation if no symptom improvement or if symptoms worsen   Follow Up Instructions: I discussed the assessment and treatment plan with the patient. The patient was provided  an opportunity to ask questions and all were answered. The patient agreed with the plan and demonstrated an understanding of the instructions.  A copy of instructions were sent to the patient via MyChart unless otherwise noted below.    The patient was advised to call back or seek an in-person evaluation if the symptoms worsen or if the condition fails to improve as anticipated.  Time:  I spent 10 minutes with the patient via telehealth technology discussing the above problems/concerns.    Sharon Loveless, PA-C

## 2022-08-24 NOTE — Patient Instructions (Signed)
Sharon King, thank you for joining Margaretann Loveless, PA-C for today's virtual visit.  While this provider is not your primary care provider (PCP), if your PCP is located in our provider database this encounter information will be shared with them immediately following your visit.   A [facility] MyChart account gives you access to today's visit and all your visits, tests, and labs performed at [facility] " click here if you don't have a [facility] MyChart account or go to [email or web address]  Consent: (Patient) Sharon King provided verbal consent for this virtual visit at the beginning of the encounter.  Current Medications:  Current Outpatient Medications:    amoxicillin-clavulanate (AUGMENTIN) 875-125 MG tablet, Take 1 tablet by mouth 2 (two) times daily., Disp: 20 tablet, Rfl: 0   fluticasone (FLONASE) 50 MCG/ACT nasal spray, Place 2 sprays into both nostrils daily., Disp: 16 g, Rfl: 0   promethazine-dextromethorphan (PROMETHAZINE-DM) 6.25-15 MG/5ML syrup, Take 5 mLs by mouth 4 (four) times daily as needed., Disp: 118 mL, Rfl: 0   albuterol (VENTOLIN HFA) 108 (90 Base) MCG/ACT inhaler, Inhale 2 puffs into the lungs every 4 (four) hours as needed for wheezing or shortness of breath. (Patient not taking: Reported on 05/15/2022), Disp: 36 g, Rfl: 2   ALPRAZolam (XANAX) 0.5 MG tablet, Take 0.5-1 tablets (0.25-0.5 mg total) by mouth 2 (two) times daily as needed for anxiety., Disp: 20 tablet, Rfl: 0   cetirizine (ZYRTEC ALLERGY) 10 MG tablet, Take 1 tablet (10 mg total) by mouth at bedtime., Disp: 90 tablet, Rfl: 1   EPINEPHrine 0.3 mg/0.3 mL IJ SOAJ injection, Inject 0.3 mg into the muscle as needed for anaphylaxis., Disp: 1 each, Rfl: 1   famotidine (PEPCID) 20 MG tablet, Take 1 tablet (20 mg total) by mouth 2 (two) times daily., Disp: 60 tablet, Rfl: 0   hydrOXYzine (ATARAX/VISTARIL) 25 MG tablet, TAKE ONE TABLET BY MOUTH EVERY 6 HOURS AS NEEDED FOR  ITCHING, Disp: 30 tablet, Rfl: 1   ipratropium (ATROVENT) 0.06 % nasal spray, Place 2 sprays into both nostrils 3 (three) times daily. As needed for nasal congestion, runny nose (Patient not taking: Reported on 05/15/2022), Disp: 15 mL, Rfl: 1   ketorolac (TORADOL) 10 MG tablet, Take 1 tablet (10 mg total) by mouth every 6 (six) hours as needed. (Patient not taking: Reported on 03/16/2022), Disp: 10 tablet, Rfl: 0   Lasmiditan Succinate (REYVOW) 100 MG TABS, Take 1 tablet (100 mg total) by mouth as needed., Disp: 8 tablet, Rfl: 3   metoprolol tartrate (LOPRESSOR) 25 MG tablet, Take 1 tablet (25 mg total) by mouth daily., Disp: 90 tablet, Rfl: 1   montelukast (SINGULAIR) 10 MG tablet, 1 tablet Orally Once a day for 30 days, Disp: , Rfl:    omalizumab (XOLAIR) 150 MG/ML prefilled syringe, Inject into the skin every 14 (fourteen) days., Disp: , Rfl:    SPRINTEC 28 0.25-35 MG-MCG tablet, TAKE 1 TABLET DAILY. TAKE ACTIVE PILLS IN A CONTINUOUS FASHION. HAVE A WITHDRAWAL BLEED EVERY 3-6 MONTHS, Disp: 84 tablet, Rfl: 4   tiZANidine (ZANAFLEX) 4 MG tablet, Take 2 tablets (8 mg total) by mouth at bedtime., Disp: 60 tablet, Rfl: 5   topiramate (TOPAMAX) 200 MG tablet, Take 1 tablet (200 mg total) by mouth daily., Disp: 90 tablet, Rfl: 1   Medications ordered in this encounter:  Meds ordered this encounter  Medications   amoxicillin-clavulanate (AUGMENTIN) 875-125 MG tablet    Sig: Take 1 tablet by mouth 2 (two) times  daily.    Dispense:  20 tablet    Refill:  0    Order Specific Question:   Supervising Provider    Answer:   Merrilee Jansky [[id number]]   fluticasone (FLONASE) 50 MCG/ACT nasal spray    Sig: Place 2 sprays into both nostrils daily.    Dispense:  16 g    Refill:  0    Order Specific Question:   Supervising Provider    Answer:   Merrilee Jansky [id number]   promethazine-dextromethorphan (PROMETHAZINE-DM) 6.25-15 MG/5ML syrup    Sig: Take 5 mLs by mouth 4 (four) times daily as needed.     Dispense:  118 mL    Refill:  0    Order Specific Question:   Supervising Provider    Answer:   Merrilee Jansky [[id number]]     *If you need refills on other medications prior to your next appointment, please contact your pharmacy*  Follow-Up: Call back or seek an in-person evaluation if the symptoms worsen or if the condition fails to improve as anticipated.  [facility] [phone]  Other Instructions  Sinus Infection, Adult A sinus infection, also called sinusitis, is inflammation of your sinuses. Sinuses are hollow spaces in the bones around your face. Your sinuses are located: Around your eyes. In the middle of your forehead. Behind your nose. In your cheekbones. Mucus normally drains out of your sinuses. When your nasal tissues become inflamed or swollen, mucus can become trapped or blocked. This allows bacteria, viruses, and fungi to grow, which leads to infection. Most infections of the sinuses are caused by a virus. A sinus infection can develop quickly. It can last for up to 4 weeks (acute) or for more than 12 weeks (chronic). A sinus infection often develops after a cold. What are the causes? This condition is caused by anything that creates swelling in the sinuses or stops mucus from draining. This includes: Allergies. Asthma. Infection from bacteria or viruses. Deformities or blockages in your nose or sinuses. Abnormal growths in the nose (nasal polyps). Pollutants, such as chemicals or irritants in the air. Infection from fungi. This is rare. What increases the risk? You are more likely to develop this condition if you: Have a weak body defense system (immune system). Do a lot of swimming or diving. Overuse nasal sprays. Smoke. What are the signs or symptoms? The main symptoms of this condition are pain and a feeling of pressure around the affected sinuses. Other symptoms include: Stuffy nose or congestion that makes it difficult to breathe  through your nose. Thick yellow or greenish drainage from your nose. Tenderness, swelling, and warmth over the affected sinuses. A cough that may get worse at night. Decreased sense of smell and taste. Extra mucus that collects in the throat or the back of the nose (postnasal drip) causing a sore throat or bad breath. Tiredness (fatigue). Fever. How is this diagnosed? This condition is diagnosed based on: Your symptoms. Your medical history. A physical exam. Tests to find out if your condition is acute or chronic. This may include: Checking your nose for nasal polyps. Viewing your sinuses using a device that has a light (endoscope). Testing for allergies or bacteria. Imaging tests, such as an MRI or CT scan. In rare cases, a bone biopsy may be done to rule out more serious types of fungal sinus disease. How is this treated? Treatment for a sinus infection depends on the cause and whether your condition is chronic  or acute. If caused by a virus, your symptoms should go away on their own within 10 days. You may be given medicines to relieve symptoms. They include: Medicines that shrink swollen nasal passages (decongestants). A spray that eases inflammation of the nostrils (topical intranasal corticosteroids). Rinses that help get rid of thick mucus in your nose (nasal saline washes). Medicines that treat allergies (antihistamines). Over-the-counter pain relievers. If caused by bacteria, your health care provider may recommend waiting to see if your symptoms improve. Most bacterial infections will get better without antibiotic medicine. You may be given antibiotics if you have: A severe infection. A weak immune system. If caused by narrow nasal passages or nasal polyps, surgery may be needed. Follow these instructions at home: Medicines Take, use, or apply over-the-counter and prescription medicines only as told by your health care provider. These may include nasal sprays. If you were  prescribed an antibiotic medicine, take it as told by your health care provider. Do not stop taking the antibiotic even if you start to feel better. Hydrate and humidify  Drink enough fluid to keep your urine pale yellow. Staying hydrated will help to thin your mucus. Use a cool mist humidifier to keep the humidity level in your home above 50%. Inhale steam for 10-15 minutes, 3-4 times a day, or as told by your health care provider. You can do this in the bathroom while a hot shower is running. Limit your exposure to cool or dry air. Rest Rest as much as possible. Sleep with your head raised (elevated). Make sure you get enough sleep each night. General instructions  Apply a warm, moist washcloth to your face 3-4 times a day or as told by your health care provider. This will help with discomfort. Use nasal saline washes as often as told by your health care provider. Wash your hands often with soap and water to reduce your exposure to germs. If soap and water are not available, use hand sanitizer. Do not smoke. Avoid being around people who are smoking (secondhand smoke). Keep all follow-up visits. This is important. Contact a health care provider if: You have a fever. Your symptoms get worse. Your symptoms do not improve within 10 days. Get help right away if: You have a severe headache. You have persistent vomiting. You have severe pain or swelling around your face or eyes. You have vision problems. You develop confusion. Your neck is stiff. You have trouble breathing. These symptoms may be an emergency. Get help right away. Call 911. Do not wait to see if the symptoms will go away. Do not drive yourself to the hospital. Summary A sinus infection is soreness and inflammation of your sinuses. Sinuses are hollow spaces in the bones around your face. This condition is caused by nasal tissues that become inflamed or swollen. The swelling traps or blocks the flow of mucus. This allows  bacteria, viruses, and fungi to grow, which leads to infection. If you were prescribed an antibiotic medicine, take it as told by your health care provider. Do not stop taking the antibiotic even if you start to feel better. Keep all follow-up visits. This is important. This information is not intended to replace advice given to you by your health care provider. Make sure you discuss any questions you have with your health care provider. Document Revised: 03/04/2021 Document Reviewed: 03/04/2021 Elsevier Patient Education  2023 [employer].    If you have been instructed to have an in-person evaluation today at a local Urgent  Care facility, please use the link below. It will take you to a list of all of our available [facility], including address, phone number and hours of operation. Please do not delay care.  [facility]  If you or a family member do not have a primary care provider, use the link below to schedule a visit and establish care. When you choose a [facility] primary care physician or advanced practice provider, you gain a long-term partner in health. Find a Primary Care Provider  Learn more about 's in-office and virtual care options: [facility]

## 2022-08-29 ENCOUNTER — Other Ambulatory Visit: Payer: Self-pay | Admitting: Obstetrics & Gynecology

## 2022-08-29 DIAGNOSIS — Z3041 Encounter for surveillance of contraceptive pills: Secondary | ICD-10-CM

## 2022-09-01 ENCOUNTER — Encounter: Payer: Self-pay | Admitting: Physician Assistant

## 2022-09-01 MED ORDER — FLUCONAZOLE 150 MG PO TABS: 150.0000 mg | ORAL_TABLET | ORAL | 0 refills | Status: DC | PRN

## 2022-09-14 ENCOUNTER — Encounter: Payer: Self-pay | Admitting: Obstetrics and Gynecology

## 2022-09-15 ENCOUNTER — Other Ambulatory Visit: Payer: Self-pay | Admitting: *Deleted

## 2022-09-15 DIAGNOSIS — Z3041 Encounter for surveillance of contraceptive pills: Secondary | ICD-10-CM

## 2022-09-15 MED ORDER — NORGESTIMATE-ETH ESTRADIOL 0.25-35 MG-MCG PO TABS
ORAL_TABLET | ORAL | 0 refills | Status: DC
Start: 2022-09-15 — End: 2022-10-09

## 2022-10-09 ENCOUNTER — Encounter: Payer: Self-pay | Admitting: Physician Assistant

## 2022-10-09 ENCOUNTER — Other Ambulatory Visit (HOSPITAL_COMMUNITY)
Admission: RE | Admit: 2022-10-09 | Discharge: 2022-10-09 | Disposition: A | Discharge status: Home / Self Care | Payer: Medicaid Other | Source: Ambulatory Visit | Attending: Obstetrics and Gynecology | Admitting: Obstetrics and Gynecology

## 2022-10-09 ENCOUNTER — Ambulatory Visit (INDEPENDENT_AMBULATORY_CARE_PROVIDER_SITE_OTHER): Payer: Medicaid Other | Admitting: Obstetrics and Gynecology

## 2022-10-09 ENCOUNTER — Ambulatory Visit (INDEPENDENT_AMBULATORY_CARE_PROVIDER_SITE_OTHER): Payer: Medicaid Other | Admitting: Physician Assistant

## 2022-10-09 ENCOUNTER — Encounter: Payer: Self-pay | Admitting: Obstetrics and Gynecology

## 2022-10-09 VITALS — BP 140/87 | HR 88 | Ht 64.0 in | Wt 194.0 lb

## 2022-10-09 VITALS — BP 136/83 | HR 88 | Ht 64.0 in | Wt 194.0 lb

## 2022-10-09 DIAGNOSIS — Z01419 Encounter for gynecological examination (general) (routine) without abnormal findings: Secondary | ICD-10-CM | POA: Diagnosis not present

## 2022-10-09 DIAGNOSIS — Z124 Encounter for screening for malignant neoplasm of cervix: Secondary | ICD-10-CM | POA: Diagnosis not present

## 2022-10-09 DIAGNOSIS — R030 Elevated blood-pressure reading, without diagnosis of hypertension: Secondary | ICD-10-CM

## 2022-10-09 DIAGNOSIS — G43829 Menstrual migraine, not intractable, without status migrainosus: Secondary | ICD-10-CM

## 2022-10-09 DIAGNOSIS — G43709 Chronic migraine without aura, not intractable, without status migrainosus: Secondary | ICD-10-CM

## 2022-10-09 MED ORDER — PROPRANOLOL HCL 20 MG PO TABS: 20.0000 mg | ORAL_TABLET | Freq: Two times a day (BID) | ORAL | 1 refills | Status: DC

## 2022-10-09 MED ORDER — TOPIRAMATE 200 MG PO TABS: 200.0000 mg | ORAL_TABLET | Freq: Every day | ORAL | 1 refills | Status: DC

## 2022-10-09 MED ORDER — UBRELVY 100 MG PO TABS: 100.0000 mg | ORAL_TABLET | ORAL | 1 refills | Status: AC | PRN

## 2022-10-09 MED ORDER — CYCLOBENZAPRINE HCL 10 MG PO TABS: 10.0000 mg | ORAL_TABLET | Freq: Three times a day (TID) | ORAL | 1 refills | Status: DC | PRN

## 2022-10-09 MED ORDER — SLYND 4 MG PO TABS: ORAL_TABLET | ORAL | 3 refills | Status: DC

## 2022-10-09 NOTE — Progress Notes (Unsigned)
Obstetrics and Gynecology Annual Patient Evaluation  Appointment Date: 10/09/2022  OBGYN Clinic: [facility]  Primary Care Provider: Erasmo Downer  Referring Provider: Erasmo Downer, MD  Chief Complaint:  Chief Complaint  Patient presents with   Gynecologic Exam    History of Present Illness: Sharon King is a 37 y.o.  G1P0101 (Patient's last menstrual period was 09/24/2022 (approximate).), seen for the above chief complaint. Her past medical history is significant for BMI 33, ?transient HTN, menstrual migraines  Patient has been on continuous OCP for years; h/o depo and nuva ring w/o success.    No GYN issues or concerns.   Review of Systems: Pertinent items noted in HPI and remainder of comprehensive ROS otherwise negative.   Patient Active Problem List   Diagnosis Date Noted   Adjustment disorder with mixed anxiety and depressed mood 01/20/2022   Urticaria 12/22/2019   Chronic migraine without aura 06/23/2019   Muscle spasm 02/18/2016   ALLERGIC RHINITIS 04/22/2007   Asthma 11/06/2006   Migraines 11/06/2006    Past Medical History:  Past Medical History:  Diagnosis Date   Acute upper GI bleed 08/02/2019   Allergy    Anxiety    Asthma    Cancer (HCC)    GERD (gastroesophageal reflux disease)    History of preterm delivery    Migraines    Neuromuscular disorder (HCC)    Seizures (HCC)    Last seizure 2014   Sleep apnea    Trigeminal neuralgia     Past Surgical History:  Past Surgical History:  Procedure Laterality Date   BREAST ENHANCEMENT SURGERY  2008   BREAST SURGERY     CESAREAN SECTION N/A 06/13/2015   Procedure: CESAREAN SECTION;  Surgeon: Willodean Rosenthal, MD;  Location: [facility];  Service: Obstetrics;  Laterality: N/A;   COSMETIC SURGERY     ESOPHAGOGASTRODUODENOSCOPY (EGD) WITH PROPOFOL N/A 08/30/2019   Procedure: ESOPHAGOGASTRODUODENOSCOPY (EGD) WITH PROPOFOL;  Surgeon: Pasty Spillers, MD;  Location: [facility] ENDOSCOPY;  Service: Endoscopy;  Laterality: N/A;   wisdom teeth      Past Obstetrical History:  OB History  Gravida Para Term Preterm AB Living  1 1   1   1   SAB IAB Ectopic Multiple Live Births        0 1    # Outcome Date GA Lbr Len/2nd Weight Sex Delivery Anes PTL Lv  1 Preterm 06/13/15 [redacted]w[redacted]d  4 lb 5.5 oz (1.97 kg) M CS-LTranv Spinal       Birth Comments: RLE amniotic band (not cicumferential)    Past Gynecological History: As per HPI. Periods: on placebo days (q3-4x per year) History of Pap Smear(s): Yes.   Last pap 2018, which was negative She is currently using oral contraceptives (estrogen/progesterone) for contraception.  Social History:  Social History   Socioeconomic History   Marital status: Divorced    Spouse name: Not on file   Number of children: Not on file   Years of education: Not on file   Highest education level: Not on file  Occupational History   Not on file  Tobacco Use   Smoking status: Former   Smokeless tobacco: Former  Building services engineer Use: Never used  Substance and Sexual Activity   Alcohol use: Yes    Comment: socially   Drug use: No   Sexual activity: Yes    Partners: Male    Birth control/protection: Pill  Other Topics Concern   Not  on file  Social History Narrative   Not on file   Social Determinants of Health   Financial Resource Strain: Not on file  Food Insecurity: Not on file  Transportation Needs: Not on file  Physical Activity: Not on file  Stress: Not on file  Social Connections: Not on file  Intimate Partner Violence: Not on file    Family History:  Family History  Problem Relation Age of Onset   Hyperlipidemia Mother    Arthritis Mother    Depression Mother    Arthritis Father    Depression Father    Alcohol abuse Father     Medications [name] had no medications administered during this visit. Current Outpatient Medications  Medication Sig Dispense Refill    EPINEPHrine 0.3 mg/0.3 mL IJ SOAJ injection Inject 0.3 mg into the muscle as needed for anaphylaxis. 1 each 1   hydrOXYzine (ATARAX/VISTARIL) 25 MG tablet TAKE ONE TABLET BY MOUTH EVERY 6 HOURS AS NEEDED FOR ITCHING 30 tablet 1   ipratropium (ATROVENT) 0.06 % nasal spray Place 2 sprays into both nostrils 3 (three) times daily. As needed for nasal congestion, runny nose 15 mL 1   ketorolac (TORADOL) 10 MG tablet Take 1 tablet (10 mg total) by mouth every 6 (six) hours as needed. 10 tablet 0   Lasmiditan Succinate (REYVOW) 100 MG TABS Take 1 tablet (100 mg total) by mouth as needed. 8 tablet 3   montelukast (SINGULAIR) 10 MG tablet 1 tablet Orally Once a day for 30 days     norgestimate-ethinyl estradiol (ESTARYLLA) 0.25-35 MG-MCG tablet TAKE 1 TABLET BY MOUTH EVERY DAY, TAKE ACTIVE TABLETS IN A CONTINUOUS FASHION, HAVE A WITHDRAWAL BLEED EVERY 3 TO 6 MONTHS 84 tablet 0   omalizumab (XOLAIR) 150 MG/ML prefilled syringe Inject into the skin every 14 (fourteen) days.     tiZANidine (ZANAFLEX) 4 MG tablet Take 2 tablets (8 mg total) by mouth at bedtime. 60 tablet 5   topiramate (TOPAMAX) 200 MG tablet Take 1 tablet (200 mg total) by mouth daily. 90 tablet 1   albuterol (VENTOLIN HFA) 108 (90 Base) MCG/ACT inhaler Inhale 2 puffs into the lungs every 4 (four) hours as needed for wheezing or shortness of breath. (Patient not taking: Reported on 05/15/2022) 36 g 2   ALPRAZolam (XANAX) 0.5 MG tablet Take 0.5-1 tablets (0.25-0.5 mg total) by mouth 2 (two) times daily as needed for anxiety. (Patient not taking: Reported on 10/09/2022) 20 tablet 0   amoxicillin-clavulanate (AUGMENTIN) 875-125 MG tablet Take 1 tablet by mouth 2 (two) times daily. (Patient not taking: Reported on 10/09/2022) 20 tablet 0   cetirizine (ZYRTEC ALLERGY) 10 MG tablet Take 1 tablet (10 mg total) by mouth at bedtime. 90 tablet 1   famotidine (PEPCID) 20 MG tablet Take 1 tablet (20 mg total) by mouth 2 (two) times daily. 60 tablet 0    fluconazole (DIFLUCAN) 150 MG tablet Take 1 tablet (150 mg total) by mouth every 3 (three) days as needed. (Patient not taking: Reported on 10/09/2022) 2 tablet 0   fluticasone (FLONASE) 50 MCG/ACT nasal spray Place 2 sprays into both nostrils daily. 16 g 0   metoprolol tartrate (LOPRESSOR) 25 MG tablet Take 1 tablet (25 mg total) by mouth daily. (Patient not taking: Reported on 10/09/2022) 90 tablet 1   promethazine-dextromethorphan (PROMETHAZINE-DM) 6.25-15 MG/5ML syrup Take 5 mLs by mouth 4 (four) times daily as needed. (Patient not taking: Reported on 10/09/2022) 118 mL 0   No current facility-administered  medications for this visit.    Allergies Egg-derived products, Erythromycin, Paroxetine, and Sulfonamide derivatives   Physical Exam:  BP (!) 140/87   Pulse 88   Ht 5\' 4"  (1.626 m)   Wt 194 lb (88 kg)   LMP 09/24/2022 (Approximate)   BMI 33.30 kg/m  Body mass index is 33.3 kg/m. Repeat 136/83 General appearance: Well nourished, well developed female in no acute distress.  Neck:  Supple, normal appearance, and no thyromegaly  Cardiovascular: normal s1 and s2.  No murmurs, rubs or gallops. Respiratory:  Clear to auscultation bilateral. Normal respiratory effort Abdomen: positive bowel sounds and no masses, hernias; diffusely non tender to palpation, non distended Breasts: bilateral implants, no palpable abnormalities otherwise. Neuro/Psych:  Normal mood and affect.  Skin:  Warm and dry.  Lymphatic:  No inguinal lymphadenopathy.   Cervical exam performed in the presence of a chaperone Pelvic exam: is not limited by body habitus EGBUS: within normal limits Vagina: within normal limits  Cervix: normal appearing cervix without tenderness, discharge or lesions. Uterus:  nonenlarged and non tender Adnexa:  normal adnexa and no mass, fullness, tenderness Rectovaginal: deferred  Laboratory: none  Radiology: none  Assessment: patient doing well  Plan:  1. Cervical cancer  screening - Cytology - PAP  2. Well woman exam with routine gynecological exam Declines STI screening - Cytology - PAP  3. Transient hypertension Repeat BP wnl  4. Menstrual migraine without status migrainosus, not intractable Given age and borderline BPs, patient okay with switching to progestin only option; will try slynd and pt to take placebo pills q3-4 months. If doesn't work, then can try lower dose combined OCP and use that continuously.    Future Appointments  Date Time Provider Department Center  10/09/2022 11:00 AM Teague Docia Chuck [facility]  11/02/2022  8:40 AM Bacigalupo, Marzella Schlein, MD [facility]    Cornelia Copa MD Attending [facility])

## 2022-10-09 NOTE — Progress Notes (Signed)
Follow Up Headache Management.  States HA's come and go.  Does not like side effects of Rx Metoprolol   History:  Sharon King is a 37 y.o. G1P0101 who presents to clinic today for headache management.  She discontinued metoprolol as it was causing her to feel dizzy intermittently on some days after taking it.  The dizziness consistently did improve when she stopped the medication.  She wishes to not use this medication.  She is using 200mg  topiramate for prevention without issue.  She notes she has not been sleeping well particularly over the last 3 days but it uncertain to the cause.  She notes she has made progress with executing the final will and testament of her loved ones that have passed away during the past year.  It has been extremely stressful but progress is occurring.  She has been able to get Sharon King which has worked well for her - but only got 10 with the last fill.     Number of days in the last 4 weeks with:  Severe headache: 4 Moderate headache: 4 Mild headache: 8 No headache: 12  Past Medical History:  Diagnosis Date   Acute upper GI bleed 08/02/2019   Allergy    Anxiety    Asthma    Cancer (HCC)    GERD (gastroesophageal reflux disease)    History of preterm delivery    Migraines    Neuromuscular disorder (HCC)    Seizures (HCC)    Last seizure 2014   Sleep apnea    Trigeminal neuralgia     Social History   Socioeconomic History   Marital status: Divorced    Spouse name: Not on file   Number of children: Not on file   Years of education: Not on file   Highest education level: Not on file  Occupational History   Not on file  Tobacco Use   Smoking status: Former   Smokeless tobacco: Former  Building services engineer Use: Never used  Substance and Sexual Activity   Alcohol use: Yes    Comment: socially   Drug use: No   Sexual activity: Yes    Partners: Male    Birth control/protection: Pill  Other Topics Concern   Not on file  Social  History Narrative   Not on file   Social Determinants of Health   Financial Resource Strain: Not on file  Food Insecurity: Not on file  Transportation Needs: Not on file  Physical Activity: Not on file  Stress: Not on file  Social Connections: Not on file  Intimate Partner Violence: Not on file    Family History  Problem Relation Age of Onset   Hyperlipidemia Mother    Arthritis Mother    Depression Mother    Arthritis Father    Depression Father    Alcohol abuse Father     Allergies  Allergen Reactions   Egg-Derived Products Rash   Erythromycin Rash   Paroxetine Palpitations   Sulfonamide Derivatives Rash    Current Outpatient Medications on File Prior to Visit  Medication Sig Dispense Refill   albuterol (VENTOLIN HFA) 108 (90 Base) MCG/ACT inhaler Inhale 2 puffs into the lungs every 4 (four) hours as needed for wheezing or shortness of breath. (Patient not taking: Reported on 05/15/2022) 36 g 2   ALPRAZolam (XANAX) 0.5 MG tablet Take 0.5-1 tablets (0.25-0.5 mg total) by mouth 2 (two) times daily as needed for anxiety. (Patient not taking: Reported on 10/09/2022) 20 tablet  0   amoxicillin-clavulanate (AUGMENTIN) 875-125 MG tablet Take 1 tablet by mouth 2 (two) times daily. (Patient not taking: Reported on 10/09/2022) 20 tablet 0   cetirizine (ZYRTEC ALLERGY) 10 MG tablet Take 1 tablet (10 mg total) by mouth at bedtime. 90 tablet 1   Drospirenone (SLYND) 4 MG TABS Take one tab po at the same time qday. Only take the placebo pills once every 3 months 120 tablet 3   EPINEPHrine 0.3 mg/0.3 mL IJ SOAJ injection Inject 0.3 mg into the muscle as needed for anaphylaxis. 1 each 1   famotidine (PEPCID) 20 MG tablet Take 1 tablet (20 mg total) by mouth 2 (two) times daily. 60 tablet 0   fluconazole (DIFLUCAN) 150 MG tablet Take 1 tablet (150 mg total) by mouth every 3 (three) days as needed. (Patient not taking: Reported on 10/09/2022) 2 tablet 0   fluticasone (FLONASE) 50 MCG/ACT nasal  spray Place 2 sprays into both nostrils daily. 16 g 0   hydrOXYzine (ATARAX/VISTARIL) 25 MG tablet TAKE ONE TABLET BY MOUTH EVERY 6 HOURS AS NEEDED FOR ITCHING 30 tablet 1   ipratropium (ATROVENT) 0.06 % nasal spray Place 2 sprays into both nostrils 3 (three) times daily. As needed for nasal congestion, runny nose 15 mL 1   ketorolac (TORADOL) 10 MG tablet Take 1 tablet (10 mg total) by mouth every 6 (six) hours as needed. 10 tablet 0   Lasmiditan Succinate (REYVOW) 100 MG TABS Take 1 tablet (100 mg total) by mouth as needed. 8 tablet 3   metoprolol tartrate (LOPRESSOR) 25 MG tablet Take 1 tablet (25 mg total) by mouth daily. (Patient not taking: Reported on 10/09/2022) 90 tablet 1   montelukast (SINGULAIR) 10 MG tablet 1 tablet Orally Once a day for 30 days     omalizumab Sharon King) 150 MG/ML prefilled syringe Inject into the skin every 14 (fourteen) days.     promethazine-dextromethorphan (PROMETHAZINE-DM) 6.25-15 MG/5ML syrup Take 5 mLs by mouth 4 (four) times daily as needed. (Patient not taking: Reported on 10/09/2022) 118 mL 0   tiZANidine (ZANAFLEX) 4 MG tablet Take 2 tablets (8 mg total) by mouth at bedtime. 60 tablet 5   topiramate (TOPAMAX) 200 MG tablet Take 1 tablet (200 mg total) by mouth daily. 90 tablet 1   No current facility-administered medications on file prior to visit.     Review of Systems:  All pertinent positive/negative included in HPI, all other review of systems are negative   Objective:  Physical Exam BP (!) 140/87   Pulse 88   Ht 5\' 4"  (1.626 m)   Wt 194 lb (88 kg)   LMP 09/24/2022 (Approximate)   BMI 33.30 kg/m  CONSTITUTIONAL: Well-developed, well-nourished female in no acute distress.  EYES: EOM intact ENT: Normocephalic CARDIOVASCULAR: Regular rate  RESPIRATORY: Normal rate.  MUSCULOSKELETAL: Normal ROM SKIN: Warm, dry without erythema  NEUROLOGICAL: Alert, oriented, CN II-XII grossly intact, Appropriate balance PSYCH: Normal behavior,  mood   Assessment & Plan:  Assessment: 1. Chronic migraine without aura without status migrainosus, not intractable      Plan: Will change muscle relaxant to flexeril in place of tizanidine Will use propanolol in place of metoprolol.  May take 20mg  up to twice daily as tolerated.  Continue with Sharon King but consider nurtec which can also be used for prevention Maintain Topamax at 200mg  daily per pt request.  She is aware she can increase dosing of this medication and expect additional benefit (but with added risk of interfering with  OCP).   We have also discussed that botox could be a preventive option to explore.  Follow-up in 3-4 months or sooner PRN  33 minutes spent in direct care of this patient encounter.  Bertram Denver, PA-C 10/09/2022 11:20 AM

## 2022-10-09 NOTE — Progress Notes (Unsigned)
Patient presents for Annual.  LMP: 09/24/2022 Last pap:  2018  Contraception: OCP Mammogram: Not yet indicated STD Screening: Declines Flu Vaccine : N/A  CC: Annual/None  Fun Fact: Patient loves going to the [place].

## 2022-10-13 LAB — CYTOLOGY - PAP
Comment: NEGATIVE
Diagnosis: NEGATIVE
High risk HPV: NEGATIVE

## 2022-11-02 ENCOUNTER — Encounter: Payer: Medicaid Other | Admitting: Family Medicine

## 2022-11-11 ENCOUNTER — Encounter: Payer: Self-pay | Admitting: Obstetrics and Gynecology

## 2022-12-01 ENCOUNTER — Ambulatory Visit (INDEPENDENT_AMBULATORY_CARE_PROVIDER_SITE_OTHER): Payer: Medicaid Other | Admitting: Family Medicine

## 2022-12-01 ENCOUNTER — Encounter: Payer: Self-pay | Admitting: Family Medicine

## 2022-12-01 VITALS — BP 119/86 | HR 107 | Temp 97.6°F | Resp 16 | Ht 64.0 in | Wt 192.3 lb

## 2022-12-01 DIAGNOSIS — Z0000 Encounter for general adult medical examination without abnormal findings: Secondary | ICD-10-CM

## 2022-12-01 DIAGNOSIS — Z1159 Encounter for screening for other viral diseases: Secondary | ICD-10-CM | POA: Diagnosis not present

## 2022-12-01 DIAGNOSIS — Z3009 Encounter for other general counseling and advice on contraception: Secondary | ICD-10-CM

## 2022-12-01 DIAGNOSIS — Z13220 Encounter for screening for lipoid disorders: Secondary | ICD-10-CM

## 2022-12-01 DIAGNOSIS — R739 Hyperglycemia, unspecified: Secondary | ICD-10-CM

## 2022-12-01 NOTE — Progress Notes (Signed)
Complete physical exam  Patient: Sharon King   DOB: 1985/06/22   37 y.o. Female  MRN: [id number]  Subjective:    Chief Complaint  Patient presents with   Annual Exam    Sharon King is a 37 y.o. female who presents today for a complete physical exam. She reports consuming a general diet.  She generally feels well. She reports sleeping well. She does have additional problems to discuss today.   Discussed the use of AI scribe software for clinical note transcription with the patient, who gave verbal consent to proceed.  History of Present Illness   The patient, with a history of migraines with aura, presents for a physical examination. She expresses concerns about her new progesterone-only birth control, which was prescribed due to her migraines with aura. The patient reports experiencing dizziness, heavy bleeding, and possible cyst formation since starting the new birth control. She mentions that these symptoms have been improving, but she is still considering other contraceptive options, including surgical ones. The patient also mentions a family history of heart disease and expresses interest in any relevant tests.        Most recent fall risk assessment:    12/01/2022    3:49 PM  Fall Risk   Falls in the past year? 0  Number falls in past yr: 0  Injury with Fall? 0  Risk for fall due to : No Fall Risks  Follow up Falls evaluation completed     Most recent depression screenings:    12/01/2022    3:49 PM 10/09/2022   10:13 AM  PHQ 2/9 Scores  PHQ - 2 Score 0 0  PHQ- 9 Score 2 3        Patient Care Team: Erasmo Downer, MD as PCP - General (Family Medicine) Jene Every, MD as Consulting Physician (Orthopedic Surgery)   Outpatient Medications Prior to Visit  Medication Sig   cetirizine (ZYRTEC ALLERGY) 10 MG tablet Take 1 tablet (10 mg total) by mouth at bedtime.   Drospirenone (SLYND) 4 MG TABS Take one tab po at the same time qday. Only  take the placebo pills once every 3 months   EPINEPHrine 0.3 mg/0.3 mL IJ SOAJ injection Inject 0.3 mg into the muscle as needed for anaphylaxis.   famotidine (PEPCID) 20 MG tablet Take 1 tablet (20 mg total) by mouth 2 (two) times daily.   fluticasone (FLONASE) 50 MCG/ACT nasal spray Place 2 sprays into both nostrils daily.   hydrOXYzine (ATARAX/VISTARIL) 25 MG tablet TAKE ONE TABLET BY MOUTH EVERY 6 HOURS AS NEEDED FOR ITCHING   ipratropium (ATROVENT) 0.06 % nasal spray Place 2 sprays into both nostrils 3 (three) times daily. As needed for nasal congestion, runny nose   Lasmiditan Succinate (REYVOW) 100 MG TABS Take 1 tablet (100 mg total) by mouth as needed.   omalizumab Geoffry Paradise) 150 MG/ML prefilled syringe Inject into the skin every 14 (fourteen) days.   propranolol (INDERAL) 20 MG tablet Take 1 tablet (20 mg total) by mouth 2 (two) times daily.   topiramate (TOPAMAX) 200 MG tablet Take 1 tablet (200 mg total) by mouth daily.   Ubrogepant (UBRELVY) 100 MG TABS Take 1 tablet (100 mg total) by mouth as needed (migraine).   [DISCONTINUED] albuterol (VENTOLIN HFA) 108 (90 Base) MCG/ACT inhaler Inhale 2 puffs into the lungs every 4 (four) hours as needed for wheezing or shortness of breath. (Patient not taking: Reported on 05/15/2022)   [DISCONTINUED] ALPRAZolam (XANAX) 0.5 MG tablet  Take 0.5-1 tablets (0.25-0.5 mg total) by mouth 2 (two) times daily as needed for anxiety. (Patient not taking: Reported on 10/09/2022)   [DISCONTINUED] amoxicillin-clavulanate (AUGMENTIN) 875-125 MG tablet Take 1 tablet by mouth 2 (two) times daily. (Patient not taking: Reported on 10/09/2022)   [DISCONTINUED] cyclobenzaprine (FLEXERIL) 10 MG tablet Take 1 tablet (10 mg total) by mouth every 8 (eight) hours as needed for muscle spasms.   [DISCONTINUED] fluconazole (DIFLUCAN) 150 MG tablet Take 1 tablet (150 mg total) by mouth every 3 (three) days as needed. (Patient not taking: Reported on 10/09/2022)   [DISCONTINUED]  ketorolac (TORADOL) 10 MG tablet Take 1 tablet (10 mg total) by mouth every 6 (six) hours as needed.   [DISCONTINUED] metoprolol tartrate (LOPRESSOR) 25 MG tablet Take 1 tablet (25 mg total) by mouth daily. (Patient not taking: Reported on 10/09/2022)   [DISCONTINUED] promethazine-dextromethorphan (PROMETHAZINE-DM) 6.25-15 MG/5ML syrup Take 5 mLs by mouth 4 (four) times daily as needed. (Patient not taking: Reported on 10/09/2022)   No facility-administered medications prior to visit.    ROS     Objective:     BP 119/86 (BP Location: Left Arm, Patient Position: Sitting, Cuff Size: Large)   Pulse (!) 107   Temp 97.6 F (36.4 C) (Temporal)   Resp 16   Ht 5\' 4"  (1.626 m)   Wt 192 lb 4.8 oz (87.2 kg)   SpO2 99%   BMI 33.01 kg/m    Physical Exam Vitals reviewed.  Constitutional:      General: She is not in acute distress.    Appearance: Normal appearance. She is well-developed. She is not diaphoretic.  HENT:     Head: Normocephalic and atraumatic.     Right Ear: Tympanic membrane, ear canal and external ear normal.     Left Ear: Tympanic membrane, ear canal and external ear normal.     Nose: Nose normal.     Mouth/Throat:     Mouth: Mucous membranes are moist.     Pharynx: Oropharynx is clear. No oropharyngeal exudate.  Eyes:     General: No scleral icterus.    Conjunctiva/sclera: Conjunctivae normal.     Pupils: Pupils are equal, round, and reactive to light.  Neck:     Thyroid: No thyromegaly.  Cardiovascular:     Rate and Rhythm: Normal rate and regular rhythm.     Heart sounds: Normal heart sounds. No murmur heard. Pulmonary:     Effort: Pulmonary effort is normal. No respiratory distress.     Breath sounds: Normal breath sounds. No wheezing or rales.  Abdominal:     General: There is no distension.     Palpations: Abdomen is soft.     Tenderness: There is no abdominal tenderness.  Musculoskeletal:        General: No deformity.     Cervical back: Neck supple.      Right lower leg: No edema.     Left lower leg: No edema.  Lymphadenopathy:     Cervical: No cervical adenopathy.  Skin:    General: Skin is warm and dry.     Findings: No rash.  Neurological:     Mental Status: She is alert and oriented to person, place, and time. Mental status is at baseline.     Gait: Gait normal.  Psychiatric:        Mood and Affect: Mood normal.        Behavior: Behavior normal.        Thought Content: Thought content  normal.      No results found for any visits on 12/01/22.     Assessment & Plan:    Routine Health Maintenance and Physical Exam  Immunization History  Administered Date(s) Administered   Influenza Whole 04/14/2005   Influenza,inj,Quad PF,6+ Mos 12/13/2014, 02/13/2019   Influenza-Unspecified 03/06/2020   Tdap 05/15/2015    Health Maintenance  Topic Date Due   Hepatitis C Screening  Never done   COVID-19 Vaccine (1 - 2023-24 season) Never done   INFLUENZA VACCINE  07/12/2023 (Originally 11/12/2022)   DTaP/Tdap/Td (2 - Td or Tdap) 05/14/2025   PAP-Cervical Cytology Screening  10/09/2027   PAP SMEAR-Modifier  10/09/2027   HIV Screening  Completed   HPV VACCINES  Aged Out    Discussed health benefits of physical activity, and encouraged her to engage in regular exercise appropriate for her age and condition.  Problem List Items Addressed This Visit   None Visit Diagnoses     Encounter for annual physical exam    -  Primary   Relevant Orders   Hepatitis C Antibody   CBC with Differential/Platelet   Hemoglobin A1c   Comprehensive metabolic panel   Lipid panel   Need for hepatitis C screening test       Relevant Orders   Hepatitis C Antibody   Hyperglycemia       Relevant Orders   Hemoglobin A1c   Screening for lipid disorders       Relevant Orders   Comprehensive metabolic panel   Lipid panel   Encounter for counseling regarding contraception              Migraines with Aura Patient reports migraines with aura.  Switched from metoprolol to propranolol, but experienced severe dizziness and returned to metoprolol. -Continue metoprolol for migraine management.  Contraception Patient switched to progesterone-only birth control due to migraines with aura. Reports dizziness, hormonal changes, and heavy bleeding. Considering surgical options for contraception. -Discuss contraceptive options with OB/GYN, including surgical options and long-acting reversible contraceptives.  Possible Ovarian Cyst Patient reports lower left abdominal pain, similar to previous experiences with ovarian cysts. Pain is intermittent and has not significantly worsened. Better now than it was. -Monitor symptoms. If pain worsens, consider ultrasound to evaluate for ovarian cyst.  General Health Maintenance -Order blood counts, A1c, kidney and liver function, electrolytes, cholesterol, and Hepatitis C screening. -Advise patient on upcoming availability of flu and COVID booster vaccines. -Schedule follow-up physical for next year.        Return in about 1 year (around 12/01/2023) for CPE.     Shirlee Latch, MD

## 2022-12-02 LAB — COMPREHENSIVE METABOLIC PANEL
ALT: 32 IU/L (ref 0–32)
AST: 20 IU/L (ref 0–40)
Albumin: 4.6 g/dL (ref 3.9–4.9)
Alkaline Phosphatase: 112 IU/L (ref 44–121)
BUN/Creatinine Ratio: 10 (ref 9–23)
BUN: 7 mg/dL (ref 6–20)
Bilirubin Total: <0.2 mg/dL (ref 0.0–1.2)
CO2: 22 mmol/L (ref 20–29)
Calcium: 10.3 mg/dL — ABNORMAL HIGH (ref 8.7–10.2)
Chloride: 108 mmol/L — ABNORMAL HIGH (ref 96–106)
Creatinine, Ser: 0.73 mg/dL (ref 0.57–1.00)
Globulin, Total: 3.1 g/dL (ref 1.5–4.5)
Glucose: 85 mg/dL (ref 70–99)
Potassium: 4.6 mmol/L (ref 3.5–5.2)
Sodium: 144 mmol/L (ref 134–144)
Total Protein: 7.7 g/dL (ref 6.0–8.5)
eGFR: 109 mL/min/{1.73_m2} (ref >59)

## 2022-12-02 LAB — CBC WITH DIFFERENTIAL/PLATELET
Basophils Absolute: 0.1 10*3/uL (ref 0.0–0.2)
Basos: 1 %
EOS (ABSOLUTE): 0.1 10*3/uL (ref 0.0–0.4)
Eos: 1 %
Hematocrit: 41.4 % (ref 34.0–46.6)
Hemoglobin: 13.4 g/dL (ref 11.1–15.9)
Immature Grans (Abs): 0 10*3/uL (ref 0.0–0.1)
Immature Granulocytes: 0 %
Lymphocytes Absolute: 4.1 10*3/uL — ABNORMAL HIGH (ref 0.7–3.1)
Lymphs: 33 %
MCH: 27.9 pg (ref 26.6–33.0)
MCHC: 32.4 g/dL (ref 31.5–35.7)
MCV: 86 fL (ref 79–97)
Monocytes Absolute: 0.9 10*3/uL (ref 0.1–0.9)
Monocytes: 7 %
Neutrophils Absolute: 7.1 10*3/uL — ABNORMAL HIGH (ref 1.4–7.0)
Neutrophils: 58 %
Platelets: 494 10*3/uL — ABNORMAL HIGH (ref 150–450)
RBC: 4.8 x10E6/uL (ref 3.77–5.28)
RDW: 13.4 % (ref 11.7–15.4)
WBC: 12.3 10*3/uL — ABNORMAL HIGH (ref 3.4–10.8)

## 2022-12-02 LAB — LIPID PANEL
Chol/HDL Ratio: 7.7 ratio — ABNORMAL HIGH (ref 0.0–4.4)
Cholesterol, Total: 277 mg/dL — ABNORMAL HIGH (ref 100–199)
HDL: 36 mg/dL — ABNORMAL LOW (ref >39)
LDL Chol Calc (NIH): 201 mg/dL — ABNORMAL HIGH (ref 0–99)
Triglycerides: 206 mg/dL — ABNORMAL HIGH (ref 0–149)
VLDL Cholesterol Cal: 40 mg/dL (ref 5–40)

## 2022-12-02 LAB — HEMOGLOBIN A1C
Est. average glucose Bld gHb Est-mCnc: 123 mg/dL
Hgb A1c MFr Bld: 5.9 % — ABNORMAL HIGH (ref 4.8–5.6)

## 2022-12-02 LAB — HEPATITIS C ANTIBODY: Hep C Virus Ab: NONREACTIVE

## 2022-12-03 ENCOUNTER — Encounter: Payer: Self-pay | Admitting: Obstetrics and Gynecology

## 2022-12-24 ENCOUNTER — Encounter: Payer: Self-pay | Admitting: Family Medicine

## 2022-12-25 MED ORDER — TIZANIDINE HCL 4 MG PO TABS: 8.0000 mg | ORAL_TABLET | Freq: Every evening | ORAL | 1 refills | Status: DC | PRN

## 2023-03-04 ENCOUNTER — Encounter: Payer: Self-pay | Admitting: Family Medicine

## 2023-03-05 MED ORDER — SCOPOLAMINE 1 MG/3DAYS TD PT72: 1.0000 | MEDICATED_PATCH | TRANSDERMAL | 0 refills | Status: DC

## 2023-03-18 ENCOUNTER — Encounter: Payer: Self-pay | Admitting: *Deleted

## 2023-05-14 ENCOUNTER — Encounter: Payer: Self-pay | Admitting: Obstetrics and Gynecology

## 2023-05-21 ENCOUNTER — Other Ambulatory Visit: Payer: Self-pay | Admitting: Obstetrics and Gynecology

## 2023-05-21 ENCOUNTER — Encounter: Payer: Self-pay | Admitting: Obstetrics and Gynecology

## 2023-05-21 MED ORDER — NORETHINDRONE 0.35 MG PO TABS: 1.0000 | ORAL_TABLET | Freq: Every day | ORAL | 3 refills | Status: AC

## 2023-05-24 ENCOUNTER — Other Ambulatory Visit: Payer: Self-pay | Admitting: *Deleted

## 2023-05-24 MED ORDER — TOPIRAMATE 200 MG PO TABS: 200.0000 mg | ORAL_TABLET | Freq: Every day | ORAL | 1 refills | Status: DC

## 2023-06-30 ENCOUNTER — Other Ambulatory Visit (INDEPENDENT_AMBULATORY_CARE_PROVIDER_SITE_OTHER)

## 2023-06-30 ENCOUNTER — Encounter: Payer: Self-pay | Admitting: Orthopaedic Surgery

## 2023-06-30 ENCOUNTER — Ambulatory Visit: Admitting: Orthopaedic Surgery

## 2023-06-30 ENCOUNTER — Other Ambulatory Visit (INDEPENDENT_AMBULATORY_CARE_PROVIDER_SITE_OTHER): Payer: Self-pay

## 2023-06-30 DIAGNOSIS — M25552 Pain in left hip: Secondary | ICD-10-CM

## 2023-06-30 DIAGNOSIS — M5416 Radiculopathy, lumbar region: Secondary | ICD-10-CM | POA: Diagnosis not present

## 2023-06-30 MED ORDER — METHYLPREDNISOLONE 4 MG PO TBPK: ORAL_TABLET | ORAL | 0 refills | Status: DC

## 2023-06-30 MED ORDER — MELOXICAM 15 MG PO TABS
15.0000 mg | ORAL_TABLET | Freq: Every day | ORAL | 0 refills | Status: DC
Start: 2023-06-30 — End: 2023-11-26

## 2023-06-30 MED ORDER — HYDROCODONE-ACETAMINOPHEN 5-325 MG PO TABS: 1.0000 | ORAL_TABLET | Freq: Every day | ORAL | 0 refills | Status: DC | PRN

## 2023-06-30 NOTE — Progress Notes (Addendum)
 Office Visit Note   Patient: Sharon King           Date of Birth: [birth year]           MRN: [id number] Visit Date: 06/30/2023              Requested by: Tarry Kos, MD [address] [place],  [place] [address] PCP: Erasmo Downer, MD   Assessment & Plan: Visit Diagnoses:  1. Radiculopathy, lumbar region   2. Pain in left hip     Plan: Patient likely dealing with radiculopathy of the lumbar spine given her symptoms.  At this time we will go ahead and send in Medrol Dosepak.  Patient does not want to try gabapentin at this time.  Will also send meloxicam for further anti-inflammatory benefit.  Will refer patient to physical therapy.  Patient will likely need MRI in the near future.  Patient understanding and agreeable with plan  Total face to face encounter time was greater than 45 minutes and over half of this time was spent in counseling and/or coordination of care.   Follow-Up Instructions: No follow-ups on file.   Orders:  Orders Placed This Encounter  Procedures   XR Lumbar Spine 2-3 Views   XR Pelvis 1-2 Views   Ambulatory referral to Physical Therapy   Meds ordered this encounter  Medications   methylPREDNISolone (MEDROL DOSEPAK) 4 MG TBPK tablet    Sig: Follow instructions on package    Dispense:  1 each    Refill:  0   meloxicam (MOBIC) 15 MG tablet    Sig: Take 1 tablet (15 mg total) by mouth daily.    Dispense:  30 tablet    Refill:  0   HYDROcodone-acetaminophen (NORCO/VICODIN) 5-325 MG tablet    Sig: Take 1-2 tablets by mouth daily as needed for moderate pain (pain score 4-6).    Dispense:  10 tablet    Refill:  0      Procedures: No procedures performed   Clinical Data: No additional findings.   Subjective: Chief Complaint  Patient presents with   Lower Back - Pain    Patient is presenting with severe left-sided lower back pain that radiates all the way down into her foot.  Patient states that a month ago she woke up  and had sharp shooting pain that localized in her lower back to her hip area.  Patient states that she did not do any activities or heavy lifting or bending.  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  Patient states that the pain was severe and she can get out of bed that day.  Patient states that over the past 1 month the pain is worsened to the point where she cannot do anything.  Patient notes numbness of her foot, tingling down her leg and pain that is starting to occur in the lumbar area. Patient denies any fevers or chills. Patient notes she went to the ER at [facility] where she had x-rays done and they gave her lidocaine patch and told her that she was doing okay.    Review of Systems  Constitutional: Negative.   HENT: Negative.    Eyes: Negative.   Respiratory: Negative.    Cardiovascular: Negative.   Endocrine: Negative.   Musculoskeletal: Negative.   Neurological: Negative.   Hematological: Negative.   Psychiatric/Behavioral: Negative.    All other systems reviewed and are negative.    Objective: Vital Signs: There were no vitals  taken for this visit.  Physical Exam Vitals and nursing note reviewed.  Constitutional:      Appearance: She is well-developed.  HENT:     Head: Atraumatic.     Nose: Nose normal.  Eyes:     Extraocular Movements: Extraocular movements intact.  Cardiovascular:     Pulses: Normal pulses.  Pulmonary:     Effort: Pulmonary effort is normal.  Abdominal:     Palpations: Abdomen is soft.  Musculoskeletal:     Cervical back: Neck supple.  Skin:    General: Skin is warm.     Capillary Refill: Capillary refill takes less than 2 seconds.  Neurological:     Mental Status: She is alert. Mental status is at baseline.  Psychiatric:        Behavior: Behavior normal.        Thought Content: Thought content normal.        Judgment: Judgment normal.     Ortho Exam Lumbar spine:  - Inspection: no gross deformity or scoliosis; no swelling  or ecchymosis. No skin changes - Palpation: TTP over the spinous processes, paraspinal muscles, and SI joints of the left side. - ROM: Decreased ROM d/t pain, unable to flex or extend hip.  - Strength: 5/5 strength of lower extremity in L4-S1 nerve root distributions b/l  *L1/L2: Hip Flexion & Abduction  *L3/L4: Knee Extension  *L4/L5: Ankle Dorsiflexion  *L5: Great Toe Extension  *S1: Ankle Plantar Flexion - Neuro: sensation intact in the L4-S1 nerve root distribution b/l, 2+ L4 and S1 reflexes - Provocative Testing: Positive straight leg raise, Modified Slump Test  Specialty Comments:  No specialty comments available.  Imaging: XR Pelvis 1-2 Views Result Date: 06/30/2023 X-rays of the pelvis show no acute or structural abnormalities  XR Lumbar Spine 2-3 Views Result Date: 06/30/2023 2 view x-ray AP and lateral of the lumbar spine.  No signs of joint space degeneration.  Minimal narrowing of the foramina at L4-L5.  Lateral view shows appropriate disc height with no signs of any sclerotic or osteophytic change. 1 view x-ray AP of the hip.  Bilateral hip joint space appears appropriate without any significant changes.  There is some mild sclerotic changes along the inferior aspect of the acetabulum.  This is noted bilaterally.    PMFS History: Patient Active Problem List   Diagnosis Date Noted   Adjustment disorder with mixed anxiety and depressed mood 01/20/2022   Urticaria 12/22/2019   Chronic migraine without aura 06/23/2019   Muscle spasm 02/18/2016   Allergic rhinitis 04/22/2007   Asthma 11/06/2006   Migraines 11/06/2006   Past Medical History:  Diagnosis Date   Acute upper GI bleed 08/02/2019   Allergy    Anxiety    Asthma    Cancer (HCC)    GERD (gastroesophageal reflux disease)    History of preterm delivery    Migraines    Neuromuscular disorder (HCC)    Seizures (HCC)    Last seizure 2014   Sleep apnea    Trigeminal neuralgia     Family History  Problem  Relation Age of Onset   Hyperlipidemia Mother    Arthritis Mother    Depression Mother    Arthritis Father    Depression Father    Alcohol abuse Father     Past Surgical History:  Procedure Laterality Date   BREAST ENHANCEMENT SURGERY  2008   BREAST SURGERY     CESAREAN SECTION N/A 06/13/2015   Procedure: CESAREAN SECTION;  Surgeon:  Willodean Rosenthal, MD;  Location: [facility];  Service: Obstetrics;  Laterality: N/A;   COSMETIC SURGERY     ESOPHAGOGASTRODUODENOSCOPY (EGD) WITH PROPOFOL N/A 08/30/2019   Procedure: ESOPHAGOGASTRODUODENOSCOPY (EGD) WITH PROPOFOL;  Surgeon: Pasty Spillers, MD;  Location: [facility];  Service: Endoscopy;  Laterality: N/A;   wisdom teeth     Social History   Occupational History   Not on file  Tobacco Use   Smoking status: Former   Smokeless tobacco: Former  Building services engineer status: Never Used  Substance and Sexual Activity   Alcohol use: Yes    Comment: socially   Drug use: No   Sexual activity: Yes    Partners: Male    Birth control/protection: Pill

## 2023-07-02 ENCOUNTER — Encounter: Payer: Self-pay | Admitting: Family Medicine

## 2023-07-02 DIAGNOSIS — M62838 Other muscle spasm: Secondary | ICD-10-CM

## 2023-07-05 MED ORDER — TIZANIDINE HCL 4 MG PO TABS: 8.0000 mg | ORAL_TABLET | Freq: Every evening | ORAL | 0 refills | Status: DC | PRN

## 2023-07-15 ENCOUNTER — Ambulatory Visit (HOSPITAL_BASED_OUTPATIENT_CLINIC_OR_DEPARTMENT_OTHER): Admitting: Orthopaedic Surgery

## 2023-07-15 ENCOUNTER — Telehealth (HOSPITAL_BASED_OUTPATIENT_CLINIC_OR_DEPARTMENT_OTHER): Payer: Self-pay | Admitting: Orthopaedic Surgery

## 2023-07-15 DIAGNOSIS — M5416 Radiculopathy, lumbar region: Secondary | ICD-10-CM | POA: Diagnosis not present

## 2023-07-15 NOTE — Telephone Encounter (Signed)
 Order changed to [facility]

## 2023-07-15 NOTE — Telephone Encounter (Signed)
 Patient would prefer to go to [facility] to get her MRI they have appointments tomorrow if possible

## 2023-07-15 NOTE — Progress Notes (Signed)
 Chief Complaint: Radiating bilateral lower leg pain     History of Present Illness:    Sharon King is a 38 y.o. female presents today for follow-up with bilateral radiating leg pain as well as numbness and paresthesias over the anterior lateral right worse than left thigh.  She has been experiencing pain with urination as well recently.  This is are all new symptoms.  She was placed on a Medrol Dosepak with Dr. Roda Shutters on any relief.  She is here today for further discussion    PMH/PSH/Family History/Social History/Meds/Allergies:    Past Medical History:  Diagnosis Date   Acute upper GI bleed 08/02/2019   Allergy    Anxiety    Asthma    Cancer (HCC)    GERD (gastroesophageal reflux disease)    History of preterm delivery    Migraines    Neuromuscular disorder (HCC)    Seizures (HCC)    Last seizure 2014   Sleep apnea    Trigeminal neuralgia    Past Surgical History:  Procedure Laterality Date   BREAST ENHANCEMENT SURGERY  2008   BREAST SURGERY     CESAREAN SECTION N/A 06/13/2015   Procedure: CESAREAN SECTION;  Surgeon: Willodean Rosenthal, MD;  Location: [facility];  Service: Obstetrics;  Laterality: N/A;   COSMETIC SURGERY     ESOPHAGOGASTRODUODENOSCOPY (EGD) WITH PROPOFOL N/A 08/30/2019   Procedure: ESOPHAGOGASTRODUODENOSCOPY (EGD) WITH PROPOFOL;  Surgeon: Pasty Spillers, MD;  Location: [facility];  Service: Endoscopy;  Laterality: N/A;   wisdom teeth     Social History   Socioeconomic History   Marital status: Divorced    Spouse name: Not on file   Number of children: Not on file   Years of education: Not on file   Highest education level: Not on file  Occupational History   Not on file  Tobacco Use   Smoking status: Former   Smokeless tobacco: Former  Building services engineer status: Never Used  Substance and Sexual Activity   Alcohol use: Yes    Comment: socially   Drug use: No   Sexual activity: Yes    Partners: Male    Birth  control/protection: Pill  Other Topics Concern   Not on file  Social History Narrative   Not on file   Social Drivers of Health   Financial Resource Strain: Not on file  Food Insecurity: Not on file  Transportation Needs: Not on file  Physical Activity: Not on file  Stress: Not on file  Social Connections: Not on file   Family History  Problem Relation Age of Onset   Hyperlipidemia Mother    Arthritis Mother    Depression Mother    Arthritis Father    Depression Father    Alcohol abuse Father    Allergies  Allergen Reactions   Egg-Derived Products Rash   Erythromycin Rash   Paroxetine Palpitations   Sulfonamide Derivatives Rash   Current Outpatient Medications  Medication Sig Dispense Refill   norethindrone (CAMILA) 0.35 MG tablet Take 1 tablet (0.35 mg total) by mouth daily. 90 tablet 3   cetirizine (ZYRTEC ALLERGY) 10 MG tablet Take 1 tablet (10 mg total) by mouth at bedtime. 90 tablet 1   EPINEPHrine 0.3 mg/0.3 mL IJ SOAJ injection Inject 0.3 mg into the muscle as needed for anaphylaxis. 1 each 1   famotidine (PEPCID) 20 MG tablet Take 1 tablet (20 mg total) by mouth 2 (two) times daily. 60 tablet 0  fluticasone (FLONASE) 50 MCG/ACT nasal spray Place 2 sprays into both nostrils daily. 16 g 0   HYDROcodone-acetaminophen (NORCO/VICODIN) 5-325 MG tablet Take 1-2 tablets by mouth daily as needed for moderate pain (pain score 4-6). 10 tablet 0   hydrOXYzine (ATARAX/VISTARIL) 25 MG tablet TAKE ONE TABLET BY MOUTH EVERY 6 HOURS AS NEEDED FOR ITCHING 30 tablet 1   ipratropium (ATROVENT) 0.06 % nasal spray Place 2 sprays into both nostrils 3 (three) times daily. As needed for nasal congestion, runny nose 15 mL 1   Lasmiditan Succinate (REYVOW) 100 MG TABS Take 1 tablet (100 mg total) by mouth as needed. 8 tablet 3   meloxicam (MOBIC) 15 MG tablet Take 1 tablet (15 mg total) by mouth daily. 30 tablet 0   methylPREDNISolone (MEDROL DOSEPAK) 4 MG TBPK tablet Follow instructions on  package 1 each 0   omalizumab (XOLAIR) 150 MG/ML prefilled syringe Inject into the skin every 14 (fourteen) days.     propranolol (INDERAL) 20 MG tablet Take 1 tablet (20 mg total) by mouth 2 (two) times daily. 180 tablet 1   scopolamine (TRANSDERM-SCOP) 1 MG/3DAYS Place 1 patch (1.5 mg total) onto the skin every 3 (three) days. 3 patch 0   tiZANidine (ZANAFLEX) 4 MG tablet Take 2 tablets (8 mg total) by mouth at bedtime as needed for muscle spasms. 60 tablet 0   topiramate (TOPAMAX) 200 MG tablet Take 1 tablet (200 mg total) by mouth daily. 90 tablet 1   Ubrogepant (UBRELVY) 100 MG TABS Take 1 tablet (100 mg total) by mouth as needed (migraine). 48 tablet 1   No current facility-administered medications for this visit.   No results found.  Review of Systems:   A ROS was performed including pertinent positives and negatives as documented in the HPI.  Physical Exam :   Constitutional: NAD and appears stated age Neurological: Alert and oriented Psych: Appropriate affect and cooperative There were no vitals taken for this visit.   Comprehensive Musculoskeletal Exam:    Positive straight leg raise bilaterally.  No changes in gait.  She does have full knee extension and flexion.  Distal neurosensory exam is intact from sensation perspective   Imaging:   Xray (lumbar spine 4 views): Normal   I personally reviewed and interpreted the radiographs.   Assessment and Plan:   38 y.o. female with concern for lumbar disc herniation and now changes in urinary status.  At this time I recommend a stat MRI of the lumbar spine.  Will plan for this and I will see her back ASAP.  I did counsel her on red flag symptoms such as perianal or vaginal numbness as well as incontinence I would recommend she proceed emergently to the emergency room should these happen  -For return to clinic following stat MRI of the lumbar spine   I personally saw and evaluated the patient, and participated in the  management and treatment plan.  Huel Cote, MD Attending Physician, Orthopedic Surgery  This document was dictated using Dragon voice recognition software. A reasonable attempt at proof reading has been made to minimize errors.

## 2023-07-16 ENCOUNTER — Ambulatory Visit (HOSPITAL_BASED_OUTPATIENT_CLINIC_OR_DEPARTMENT_OTHER)
Admission: RE | Admit: 2023-07-16 | Discharge: 2023-07-16 | Disposition: A | Discharge status: Home / Self Care | Source: Ambulatory Visit | Attending: Orthopaedic Surgery | Admitting: Orthopaedic Surgery

## 2023-07-16 ENCOUNTER — Telehealth (HOSPITAL_BASED_OUTPATIENT_CLINIC_OR_DEPARTMENT_OTHER): Payer: Self-pay | Admitting: Orthopaedic Surgery

## 2023-07-16 ENCOUNTER — Encounter (HOSPITAL_BASED_OUTPATIENT_CLINIC_OR_DEPARTMENT_OTHER): Payer: Self-pay

## 2023-07-16 DIAGNOSIS — M5416 Radiculopathy, lumbar region: Secondary | ICD-10-CM | POA: Diagnosis present

## 2023-07-16 NOTE — Telephone Encounter (Signed)
 Patient wants to know if her Mri has been approved

## 2023-07-22 ENCOUNTER — Ambulatory Visit (INDEPENDENT_AMBULATORY_CARE_PROVIDER_SITE_OTHER): Admitting: Orthopaedic Surgery

## 2023-07-22 DIAGNOSIS — M25552 Pain in left hip: Secondary | ICD-10-CM | POA: Diagnosis not present

## 2023-07-22 MED ORDER — TRIAMCINOLONE ACETONIDE 40 MG/ML IJ SUSP
80.0000 mg | INTRAMUSCULAR | Status: AC | PRN
  Administered 2023-07-22: 80 mg via INTRA_ARTICULAR

## 2023-07-22 MED ORDER — LIDOCAINE HCL 1 % IJ SOLN
4.0000 mL | INTRAMUSCULAR | Status: AC | PRN
  Administered 2023-07-22: 4 mL

## 2023-07-22 NOTE — Progress Notes (Signed)
 Chief Complaint: Radiating bilateral lower leg pain     History of Present Illness:   07/22/2023: Presents today for MRI follow-up.  She is experiencing pain around the lateral aspect of the thigh predominantly  Sharon King is a 38 y.o. female presents today for follow-up with bilateral radiating leg pain as well as numbness and paresthesias over the anterior lateral right worse than left thigh.  She has been experiencing pain with urination as well recently.  This is are all new symptoms.  She was placed on a Medrol Dosepak with Dr. Roda Shutters on any relief.  She is here today for further discussion    PMH/PSH/Family History/Social History/Meds/Allergies:    Past Medical History:  Diagnosis Date  . Acute upper GI bleed 08/02/2019  . Allergy   . Anxiety   . Asthma   . Cancer (HCC)   . GERD (gastroesophageal reflux disease)   . History of preterm delivery   . Migraines   . Neuromuscular disorder (HCC)   . Seizures (HCC)    Last seizure 2014  . Sleep apnea   . Trigeminal neuralgia    Past Surgical History:  Procedure Laterality Date  . BREAST ENHANCEMENT SURGERY  2008  . BREAST SURGERY    . CESAREAN SECTION N/A 06/13/2015   Procedure: CESAREAN SECTION;  Surgeon: Willodean Rosenthal, MD;  Location: [facility];  Service: Obstetrics;  Laterality: N/A;  . COSMETIC SURGERY    . ESOPHAGOGASTRODUODENOSCOPY (EGD) WITH PROPOFOL N/A 08/30/2019   Procedure: ESOPHAGOGASTRODUODENOSCOPY (EGD) WITH PROPOFOL;  Surgeon: Pasty Spillers, MD;  Location: [facility];  Service: Endoscopy;  Laterality: N/A;  . wisdom teeth     Social History   Socioeconomic History  . Marital status: Divorced    Spouse name: Not on file  . Number of children: Not on file  . Years of education: Not on file  . Highest education level: Not on file  Occupational History  . Not on file  Tobacco Use  . Smoking status: Former  . Smokeless tobacco: Former  Advertising account planner  . Vaping status: Never Used   Substance and Sexual Activity  . Alcohol use: Yes    Comment: socially  . Drug use: No  . Sexual activity: Yes    Partners: Male    Birth control/protection: Pill  Other Topics Concern  . Not on file  Social History Narrative  . Not on file   Social Drivers of Health   Financial Resource Strain: Not on file  Food Insecurity: Not on file  Transportation Needs: Not on file  Physical Activity: Not on file  Stress: Not on file  Social Connections: Not on file   Family History  Problem Relation Age of Onset  . Hyperlipidemia Mother   . Arthritis Mother   . Depression Mother   . Arthritis Father   . Depression Father   . Alcohol abuse Father    Allergies  Allergen Reactions  . Egg-Derived Products Rash  . Erythromycin Rash  . Paroxetine Palpitations  . Sulfonamide Derivatives Rash   Current Outpatient Medications  Medication Sig Dispense Refill  . norethindrone (CAMILA) 0.35 MG tablet Take 1 tablet (0.35 mg total) by mouth daily. 90 tablet 3  . cetirizine (ZYRTEC ALLERGY) 10 MG tablet Take 1 tablet (10 mg total) by mouth at bedtime. 90 tablet 1  . EPINEPHrine 0.3 mg/0.3 mL IJ SOAJ injection Inject 0.3 mg into the muscle as needed for anaphylaxis. 1 each 1  . famotidine (PEPCID) 20  MG tablet Take 1 tablet (20 mg total) by mouth 2 (two) times daily. 60 tablet 0  . fluticasone (FLONASE) 50 MCG/ACT nasal spray Place 2 sprays into both nostrils daily. 16 g 0  . HYDROcodone-acetaminophen (NORCO/VICODIN) 5-325 MG tablet Take 1-2 tablets by mouth daily as needed for moderate pain (pain score 4-6). 10 tablet 0  . hydrOXYzine (ATARAX/VISTARIL) 25 MG tablet TAKE ONE TABLET BY MOUTH EVERY 6 HOURS AS NEEDED FOR ITCHING 30 tablet 1  . ipratropium (ATROVENT) 0.06 % nasal spray Place 2 sprays into both nostrils 3 (three) times daily. As needed for nasal congestion, runny nose 15 mL 1  . Lasmiditan Succinate (REYVOW) 100 MG TABS Take 1 tablet (100 mg total) by mouth as needed. 8 tablet 3   . meloxicam (MOBIC) 15 MG tablet Take 1 tablet (15 mg total) by mouth daily. 30 tablet 0  . methylPREDNISolone (MEDROL DOSEPAK) 4 MG TBPK tablet Follow instructions on package 1 each 0  . omalizumab (XOLAIR) 150 MG/ML prefilled syringe Inject into the skin every 14 (fourteen) days.    . propranolol (INDERAL) 20 MG tablet Take 1 tablet (20 mg total) by mouth 2 (two) times daily. 180 tablet 1  . scopolamine (TRANSDERM-SCOP) 1 MG/3DAYS Place 1 patch (1.5 mg total) onto the skin every 3 (three) days. 3 patch 0  . tiZANidine (ZANAFLEX) 4 MG tablet Take 2 tablets (8 mg total) by mouth at bedtime as needed for muscle spasms. 60 tablet 0  . topiramate (TOPAMAX) 200 MG tablet Take 1 tablet (200 mg total) by mouth daily. 90 tablet 1  . Ubrogepant (UBRELVY) 100 MG TABS Take 1 tablet (100 mg total) by mouth as needed (migraine). 48 tablet 1   No current facility-administered medications for this visit.   No results found.  Review of Systems:   A ROS was performed including pertinent positives and negatives as documented in the HPI.  Physical Exam :   Constitutional: NAD and appears stated age Neurological: Alert and oriented Psych: Appropriate affect and cooperative There were no vitals taken for this visit.   Comprehensive Musculoskeletal Exam:    Tenderness over the greater trochanter with pain with palpation no changes in gait.  She does have full knee extension and flexion.  Distal neurosensory exam is intact from sensation perspective   Imaging:   Xray (lumbar spine 4 views): Normal   I personally reviewed and interpreted the radiographs.   Assessment and Plan:   38 y.o. female with an MRI which is essentially within normal limits.  Today she is experiencing pain that wraps around the lateral thigh consistent with gluteus tendinopathy.  I have recommended ultrasound-guided injection of the left hip.  Will plan to get her engaged with physical therapy as well for strengthening and range  of motion of the left hip as well as her core and pelvic floor   -Referral to physical therapy as well as left hip greater trochanteric ultrasound-guided injection provided after verbal consent obtained    Procedure Note  Patient: Sharon King             Date of Birth: 1985-08-22           MRN: [id number]             Visit Date: 07/22/2023  Procedures: Visit Diagnoses: No diagnosis found.  Large Joint Inj: L greater trochanter on 07/22/2023 11:01 AM Indications: pain Details: 22 G 3.5 in needle, ultrasound-guided anterolateral approach  Arthrogram: No  Medications: 4 mL  lidocaine 1 %; 80 mg triamcinolone acetonide 40 MG/ML Outcome: tolerated well, no immediate complications Procedure, treatment alternatives, risks and benefits explained, specific risks discussed. Consent was given by the patient. Immediately prior to procedure a time out was called to verify the correct patient, procedure, equipment, support staff and site/side marked as required. Patient was prepped and draped in the usual sterile fashion.        I personally saw and evaluated the patient, and participated in the management and treatment plan.  Huel Cote, MD Attending Physician, Orthopedic Surgery  This document was dictated using Dragon voice recognition software. A reasonable attempt at proof reading has been made to minimize errors.

## 2023-08-19 ENCOUNTER — Telehealth: Payer: Self-pay | Admitting: Radiology

## 2023-08-19 NOTE — Telephone Encounter (Signed)
 Error. Was working on referral.

## 2023-08-23 ENCOUNTER — Ambulatory Visit (HOSPITAL_BASED_OUTPATIENT_CLINIC_OR_DEPARTMENT_OTHER): Attending: Orthopaedic Surgery | Admitting: Physical Therapy

## 2023-08-23 ENCOUNTER — Encounter (HOSPITAL_BASED_OUTPATIENT_CLINIC_OR_DEPARTMENT_OTHER): Payer: Self-pay | Admitting: Physical Therapy

## 2023-08-23 ENCOUNTER — Other Ambulatory Visit: Payer: Self-pay

## 2023-08-23 DIAGNOSIS — M6281 Muscle weakness (generalized): Secondary | ICD-10-CM | POA: Insufficient documentation

## 2023-08-23 DIAGNOSIS — R208 Other disturbances of skin sensation: Secondary | ICD-10-CM | POA: Diagnosis present

## 2023-08-23 DIAGNOSIS — M5459 Other low back pain: Secondary | ICD-10-CM | POA: Diagnosis present

## 2023-08-23 DIAGNOSIS — R262 Difficulty in walking, not elsewhere classified: Secondary | ICD-10-CM | POA: Diagnosis present

## 2023-08-23 DIAGNOSIS — M25552 Pain in left hip: Secondary | ICD-10-CM | POA: Diagnosis present

## 2023-08-23 NOTE — Therapy (Signed)
 OUTPATIENT PHYSICAL THERAPY EVALUATION   Patient Name: Sharon King MRN: [id number] DOB:1986-03-14, 38 y.o., female Today's Date: 08/23/2023  END OF SESSION:  PT End of Session - 08/23/23 1020     Visit Number 1    Number of Visits 9    Date for PT Re-Evaluation 10/23/23    Authorization Type [employer]    PT Start Time 1019    PT Stop Time 1059    PT Time Calculation (min) 40 min    Activity Tolerance Patient tolerated treatment well    Behavior During Therapy [facility] for tasks assessed/performed             Past Medical History:  Diagnosis Date   Acute upper GI bleed 08/02/2019   Allergy    Anxiety    Asthma    Cancer (HCC)    GERD (gastroesophageal reflux disease)    History of preterm delivery    Migraines    Neuromuscular disorder (HCC)    Seizures (HCC)    Last seizure 2014   Sleep apnea    Trigeminal neuralgia    Past Surgical History:  Procedure Laterality Date   BREAST ENHANCEMENT SURGERY  2008   BREAST SURGERY     CESAREAN SECTION N/A 06/13/2015   Procedure: CESAREAN SECTION;  Surgeon: Lenord Radon, MD;  Location: [facility];  Service: Obstetrics;  Laterality: N/A;   COSMETIC SURGERY     ESOPHAGOGASTRODUODENOSCOPY (EGD) WITH PROPOFOL  N/A 08/30/2019   Procedure: ESOPHAGOGASTRODUODENOSCOPY (EGD) WITH PROPOFOL ;  Surgeon: Irby Mannan, MD;  Location: [facility];  Service: Endoscopy;  Laterality: N/A;   wisdom teeth     Patient Active Problem List   Diagnosis Date Noted   Adjustment disorder with mixed anxiety and depressed mood 01/20/2022   Urticaria 12/22/2019   Chronic migraine without aura 06/23/2019   Muscle spasm 02/18/2016   Allergic rhinitis 04/22/2007   Asthma 11/06/2006   Migraines 11/06/2006     REFERRING PROVIDER:  Wilhelmenia Harada, MD    REFERRING DIAG:  [phone] (ICD-10-CM) - Pain in left hip    DN, pelvic floor exercises  Rationale for Evaluation and Treatment: Rehabilitation  THERAPY DIAG:   Pain in left hip  Other disturbances of skin sensation  Other low back pain  Difficulty in walking, not elsewhere classified  Muscle weakness (generalized)  ONSET DATE: 04/14/23 (approx)  **INSURANCE DENIED AQUATICS  SUBJECTIVE:                                                                                                                                                                                           SUBJECTIVE STATEMENT: No  trauma. It has been dull for months and one day just woke up and felt liek I could not get out of bed. Went to [facility] who did xrays and gave lidocane patch. I couldn't walk, sleep or be touched on that side (Lt). It is bearable now but I just deal with it. Injections did not make a difference. Right now, Rt leg is numb and top half of left leg is numb. Still having pain with urination and frequency increase. Lt sided low back pain.  Hypermobility- noted with son and mom as well.  Noted massage therapist from the past said her hips were off.   PERTINENT HISTORY:  See pmh  PAIN:  Are you having pain? Yes: NPRS scale: moderate to severe Pain location: low back into bil LEs Pain description: sharp, stabbing, burning, tingling Aggravating factors: unsure Relieving factors: unsure  PRECAUTIONS:  None  RED FLAGS: None   WEIGHT BEARING RESTRICTIONS:  No  FALLS:  Has patient fallen in last 6 months? No  OCCUPATION:  Print production planner for [employer]  PLOF:  Independent  PATIENT GOALS:  Decrease pain   OBJECTIVE:  Note: Objective measures were completed at Evaluation unless otherwise noted.  DIAGNOSTIC FINDINGS:  Per MD note: "MRI which is essentially within normal limits"  Completed injection to Lt hip at MD appt on 07/22/23  PATIENT SURVEYS:  LEFS eval: 23/80  COGNITIVE STATUS: Within functional limits for tasks assessed   SENSATION: Impaired in bil LE   POSTURE:  Eval: standing Lt post innom rotation, functional  scoliosis (lumbar dextro, throacolevo) resting Lt cervical sidebend Bilateral pes planus Supine- Lt post innom rotation with functional LLD (Lt shorter)   GAIT: Eval: Lt trendelenburg                                                                                                                              TREATMENT DATE:   Treatment                            5/12: Blank lines following charge title = not provided on this treatment date.   Manual:  TPDN No  There-ex: HEsch self correction for Lt post innom There-Act:  Self Care: 2-layer heel lift in left shoe Neur-o-Re-ed:  Gait Training:     PATIENT EDUCATION:  Education details: Anatomy of condition, POC, HEP, exercise form/rationale Person educated: Patient Education method: Explanation, Demonstration, Tactile cues, Verbal cues, and HEP in pt instructions Education comprehension: verbalized understanding, returned demonstration, verbal cues required, tactile cues required, and needs further education  HOME EXERCISE PROGRAM: Hesch self correction for Lt post illium Standing weight in heels with glut set   ASSESSMENT:  CLINICAL IMPRESSION: Patient is a 38 y.o. F who was seen today for physical therapy evaluation and treatment for Lt hip pain. Pt has innominate rotation and structural LLD with mild scoliotic curve. Able to change the nature of  distal symptoms with mechanical chain alignment and insertion of heel lift. Pt denies wearing tennis shoes regularly but I asked that she wear them and the lift consistently for at least 2 days, removing only should concordant pain increase. Pt will benefit from skilled PT to decrease spasm and improve postural strength and coordination for support.     OBJECTIVE IMPAIRMENTS: Abnormal gait, decreased activity tolerance, decreased strength, increased muscle spasms, impaired sensation, improper body mechanics, and urinary pain and incr frequency.   ACTIVITY LIMITATIONS:  carrying, lifting, bending, sitting, standing, squatting, sleeping, stairs, continence, toileting, locomotion level, and caring for others  PARTICIPATION LIMITATIONS: cleaning, shopping, community activity, and occupation  PERSONAL FACTORS: 1-2 comorbidities: h/o c-section, chronic pain, migraines, anxiety, hypermobility are also affecting patient's functional outcome.   REHAB POTENTIAL: Good  CLINICAL DECISION MAKING: Evolving/moderate complexity  EVALUATION COMPLEXITY: Moderate   GOALS: Goals reviewed with patient? Yes  SHORT TERM GOALS: Target date: 5/31  Determine need for heel lift Baseline:began trial at eval Goal status: INITIAL  2.  Independent with basic core stability program Baseline: will progress as appropriate Goal status: INITIAL   LONG TERM GOALS: Target date: POC date  LEFS to improve by MDC Baseline: see obj Goal status: INITIAL  2.  Bilateral LE MMT within 10% Rt to Lt Baseline: not appropriate to test at eval due to innominate rotation Goal status: INITIAL  3.  Able to urinate without pain Baseline: pain at eval Goal status: INITIAL  4.  Centralization of distal symptoms Baseline: to Rt foot and Lt knee at eval Goal status: INITIAL    PLAN:  PT FREQUENCY: 1x/week  PT DURATION: 8 weeks  PLANNED INTERVENTIONS: 97164- PT Re-evaluation, 97750- Physical Performance Testing, 97110-Therapeutic exercises, 97530- Therapeutic activity, 97112- Neuromuscular re-education, 97535- Self Care, 40102- Manual therapy, [phone]- Gait training, [phone]- Aquatic Therapy, Patient/Family education, Balance training, Stair training, Taping, Dry Needling, Joint mobilization, Spinal mobilization, Scar mobilization, and Cryotherapy.  PLAN FOR NEXT SESSION: check csection scar mobility, core engagement, recheck pelvic alignment  Jakeia Carreras C. Kenlea Woodell PT, DPT 08/23/23 2:50 PM   For all possible CPT codes, reference the Planned Interventions line above.     Check all  conditions that are expected to impact treatment: {Conditions expected to impact treatment:Musculoskeletal disorders   If treatment provided at initial evaluation, no treatment charged due to lack of authorization.

## 2023-08-27 ENCOUNTER — Other Ambulatory Visit (HOSPITAL_BASED_OUTPATIENT_CLINIC_OR_DEPARTMENT_OTHER): Payer: Self-pay

## 2023-08-27 ENCOUNTER — Ambulatory Visit (HOSPITAL_BASED_OUTPATIENT_CLINIC_OR_DEPARTMENT_OTHER): Admitting: Orthopaedic Surgery

## 2023-08-27 DIAGNOSIS — M25552 Pain in left hip: Secondary | ICD-10-CM

## 2023-08-27 MED ORDER — LIDOCAINE HCL 1 % IJ SOLN
4.0000 mL | INTRAMUSCULAR | Status: AC | PRN
  Administered 2023-08-27: 4 mL

## 2023-08-27 MED ORDER — TRIAMCINOLONE ACETONIDE 40 MG/ML IJ SUSP
80.0000 mg | INTRAMUSCULAR | Status: AC | PRN
  Administered 2023-08-27: 80 mg via INTRA_ARTICULAR

## 2023-08-27 NOTE — Progress Notes (Signed)
 Chief Complaint: Radiating bilateral lower leg pain     History of Present Illness:   08/27/2023: Presents today for MRI follow-up.  At today's visit she is having more central based deep pain in the hip and groin which has been affecting the back  Sharon King is a 38 y.o. female presents today for follow-up with bilateral radiating leg pain as well as numbness and paresthesias over the anterior lateral right worse than left thigh.  She has been experiencing pain with urination as well recently.  This is are all new symptoms.  She was placed on a Medrol  Dosepak with Dr. Christiane Cowing on any relief.  She is here today for further discussion    PMH/PSH/Family History/Social History/Meds/Allergies:    Past Medical History:  Diagnosis Date  . Acute upper GI bleed 08/02/2019  . Allergy   . Anxiety   . Asthma   . Cancer (HCC)   . GERD (gastroesophageal reflux disease)   . History of preterm delivery   . Migraines   . Neuromuscular disorder (HCC)   . Seizures (HCC)    Last seizure 2014  . Sleep apnea   . Trigeminal neuralgia    Past Surgical History:  Procedure Laterality Date  . BREAST ENHANCEMENT SURGERY  2008  . BREAST SURGERY    . CESAREAN SECTION N/A 06/13/2015   Procedure: CESAREAN SECTION;  Surgeon: Lenord Radon, MD;  Location: [facility];  Service: Obstetrics;  Laterality: N/A;  . COSMETIC SURGERY    . ESOPHAGOGASTRODUODENOSCOPY (EGD) WITH PROPOFOL  N/A 08/30/2019   Procedure: ESOPHAGOGASTRODUODENOSCOPY (EGD) WITH PROPOFOL ;  Surgeon: Irby Mannan, MD;  Location: [facility];  Service: Endoscopy;  Laterality: N/A;  . wisdom teeth     Social History   Socioeconomic History  . Marital status: Divorced    Spouse name: Not on file  . Number of children: Not on file  . Years of education: Not on file  . Highest education level: Not on file  Occupational History  . Not on file  Tobacco Use  . Smoking status: Former  . Smokeless tobacco: Former  Theatre manager  . Vaping status: Never Used  Substance and Sexual Activity  . Alcohol use: Yes    Comment: socially  . Drug use: No  . Sexual activity: Yes    Partners: Male    Birth control/protection: Pill  Other Topics Concern  . Not on file  Social History Narrative  . Not on file   Social Drivers of Health   Financial Resource Strain: Not on file  Food Insecurity: Not on file  Transportation Needs: Not on file  Physical Activity: Not on file  Stress: Not on file  Social Connections: Not on file   Family History  Problem Relation Age of Onset  . Hyperlipidemia Mother   . Arthritis Mother   . Depression Mother   . Arthritis Father   . Depression Father   . Alcohol abuse Father    Allergies  Allergen Reactions  . Egg-Derived Products Rash  . Erythromycin Rash  . Paroxetine Palpitations  . Sulfonamide Derivatives Rash   Current Outpatient Medications  Medication Sig Dispense Refill  . norethindrone  (CAMILA ) 0.35 MG tablet Take 1 tablet (0.35 mg total) by mouth daily. 90 tablet 3  . cetirizine  (ZYRTEC  ALLERGY) 10 MG tablet Take 1 tablet (10 mg total) by mouth at bedtime. 90 tablet 1  . EPINEPHrine  0.3 mg/0.3 mL IJ SOAJ injection Inject 0.3 mg into the muscle as needed  for anaphylaxis. 1 each 1  . famotidine  (PEPCID ) 20 MG tablet Take 1 tablet (20 mg total) by mouth 2 (two) times daily. 60 tablet 0  . fluticasone  (FLONASE ) 50 MCG/ACT nasal spray Place 2 sprays into both nostrils daily. 16 g 0  . HYDROcodone -acetaminophen  (NORCO/VICODIN) 5-325 MG tablet Take 1-2 tablets by mouth daily as needed for moderate pain (pain score 4-6). 10 tablet 0  . hydrOXYzine  (ATARAX /VISTARIL ) 25 MG tablet TAKE ONE TABLET BY MOUTH EVERY 6 HOURS AS NEEDED FOR ITCHING 30 tablet 1  . ipratropium (ATROVENT ) 0.06 % nasal spray Place 2 sprays into both nostrils 3 (three) times daily. As needed for nasal congestion, runny nose 15 mL 1  . Lasmiditan  Succinate (REYVOW ) 100 MG TABS Take 1 tablet (100 mg total)  by mouth as needed. 8 tablet 3  . meloxicam  (MOBIC ) 15 MG tablet Take 1 tablet (15 mg total) by mouth daily. 30 tablet 0  . methylPREDNISolone  (MEDROL  DOSEPAK) 4 MG TBPK tablet Follow instructions on package 1 each 0  . omalizumab (XOLAIR) 150 MG/ML prefilled syringe Inject into the skin every 14 (fourteen) days.    . propranolol  (INDERAL ) 20 MG tablet Take 1 tablet (20 mg total) by mouth 2 (two) times daily. 180 tablet 1  . scopolamine  (TRANSDERM-SCOP) 1 MG/3DAYS Place 1 patch (1.5 mg total) onto the skin every 3 (three) days. 3 patch 0  . tiZANidine  (ZANAFLEX ) 4 MG tablet Take 2 tablets (8 mg total) by mouth at bedtime as needed for muscle spasms. 60 tablet 0  . topiramate  (TOPAMAX ) 200 MG tablet Take 1 tablet (200 mg total) by mouth daily. 90 tablet 1  . Ubrogepant  (UBRELVY ) 100 MG TABS Take 1 tablet (100 mg total) by mouth as needed (migraine). 48 tablet 1   No current facility-administered medications for this visit.   No results found.  Review of Systems:   A ROS was performed including pertinent positives and negatives as documented in the HPI.  Physical Exam :   Constitutional: NAD and appears stated age Neurological: Alert and oriented Psych: Appropriate affect and cooperative There were no vitals taken for this visit.   Comprehensive Musculoskeletal Exam:    Tenderness about femoral acetabular joint pain with 30 degrees internal rotation positive FADIR.  Negative FABER she does have full knee extension and flexion.  Distal neurosensory exam is intact from sensation perspective   Imaging:   Xray (lumbar spine 4 views): Normal  X-ray 3 views left hip: Normal  I personally reviewed and interpreted the radiographs.   Assessment and Plan:   38 y.o. female with today with evidence of left hip pain consistent with hip labral tearing and instability.  Given the fact that she has trialed 6 weeks of physical therapy I would recommend an MRI at this time so that we can further  discuss hip instability.  She would also like an ultrasound-guided injection of the left hip in order to get some relief on her upcoming trip to [place]    Procedure Note  Patient: Sharon King             Date of Birth: Aug 24, 1985           MRN: [id number]             Visit Date: 08/27/2023  Procedures: Visit Diagnoses:  1. Pain in left hip     Large Joint Inj: L hip joint on 08/27/2023 12:22 PM Indications: pain Details: 22 G 3.5 in needle, ultrasound-guided anterolateral approach  Arthrogram: No  Medications: 4 mL lidocaine  1 %; 80 mg triamcinolone  acetonide 40 MG/ML Outcome: tolerated well, no immediate complications Procedure, treatment alternatives, risks and benefits explained, specific risks discussed. Consent was given by the patient. Immediately prior to procedure a time out was called to verify the correct patient, procedure, equipment, support staff and site/side marked as required. Patient was prepped and draped in the usual sterile fashion.       I personally saw and evaluated the patient, and participated in the management and treatment plan.  Wilhelmenia Harada, MD Attending Physician, Orthopedic Surgery  This document was dictated using Dragon voice recognition software. A reasonable attempt at proof reading has been made to minimize errors.

## 2023-08-31 ENCOUNTER — Encounter (HOSPITAL_BASED_OUTPATIENT_CLINIC_OR_DEPARTMENT_OTHER): Admitting: Physical Therapy

## 2023-09-03 ENCOUNTER — Encounter: Admitting: Physician Assistant

## 2023-09-05 ENCOUNTER — Ambulatory Visit (HOSPITAL_COMMUNITY)
Admission: RE | Admit: 2023-09-05 | Discharge: 2023-09-05 | Discharge status: Home / Self Care | Source: Ambulatory Visit | Attending: Orthopaedic Surgery

## 2023-09-05 DIAGNOSIS — M25552 Pain in left hip: Secondary | ICD-10-CM | POA: Insufficient documentation

## 2023-09-07 ENCOUNTER — Encounter (HOSPITAL_BASED_OUTPATIENT_CLINIC_OR_DEPARTMENT_OTHER): Admitting: Physical Therapy

## 2023-09-14 ENCOUNTER — Encounter (HOSPITAL_BASED_OUTPATIENT_CLINIC_OR_DEPARTMENT_OTHER): Admitting: Physical Therapy

## 2023-09-15 ENCOUNTER — Ambulatory Visit (HOSPITAL_BASED_OUTPATIENT_CLINIC_OR_DEPARTMENT_OTHER): Admitting: Orthopaedic Surgery

## 2023-09-15 ENCOUNTER — Other Ambulatory Visit (HOSPITAL_BASED_OUTPATIENT_CLINIC_OR_DEPARTMENT_OTHER): Payer: Self-pay

## 2023-09-15 ENCOUNTER — Ambulatory Visit (HOSPITAL_BASED_OUTPATIENT_CLINIC_OR_DEPARTMENT_OTHER): Payer: Self-pay | Admitting: Orthopaedic Surgery

## 2023-09-15 ENCOUNTER — Encounter (HOSPITAL_BASED_OUTPATIENT_CLINIC_OR_DEPARTMENT_OTHER): Payer: Self-pay

## 2023-09-15 DIAGNOSIS — M25552 Pain in left hip: Secondary | ICD-10-CM

## 2023-09-15 MED ORDER — ASPIRIN 325 MG PO TBEC
325.0000 mg | DELAYED_RELEASE_TABLET | Freq: Every day | ORAL | 0 refills | Status: DC
  Filled 2023-09-15 – 2023-10-06 (×2): qty 14, 14d supply, fill #0

## 2023-09-15 MED ORDER — IBUPROFEN 800 MG PO TABS
800.0000 mg | ORAL_TABLET | Freq: Three times a day (TID) | ORAL | 0 refills | Status: AC
  Filled 2023-09-15 – 2023-10-06 (×2): qty 30, 10d supply, fill #0

## 2023-09-15 MED ORDER — OXYCODONE HCL 5 MG PO TABS
5.0000 mg | ORAL_TABLET | ORAL | 0 refills | Status: DC | PRN
  Filled 2023-09-15 – 2023-10-06 (×2): qty 10, 2d supply, fill #0

## 2023-09-15 MED ORDER — ACETAMINOPHEN 500 MG PO TABS
500.0000 mg | ORAL_TABLET | Freq: Three times a day (TID) | ORAL | 0 refills | Status: AC
  Filled 2023-09-15 – 2023-10-06 (×2): qty 30, 10d supply, fill #0

## 2023-09-15 NOTE — Progress Notes (Signed)
 Chief Complaint: Radiating bilateral lower leg pain     History of Present Illness:   09/15/2023: Presents today for MRI follow-up.  She did get extremely good relief from her injection in the left hip.  She is here today for further discussion and MRI follow-up  Sharon King is a 38 y.o. female presents today for follow-up with bilateral radiating leg pain as well as numbness and paresthesias over the anterior lateral right worse than left thigh.  She has been experiencing pain with urination as well recently.  This is are all new symptoms.  She was placed on a Medrol  Dosepak with Dr. Christiane Cowing on any relief.  She is here today for further discussion    PMH/PSH/Family History/Social History/Meds/Allergies:    Past Medical History:  Diagnosis Date   Acute upper GI bleed 08/02/2019   Allergy    Anxiety    Asthma    Cancer (HCC)    GERD (gastroesophageal reflux disease)    History of preterm delivery    Migraines    Neuromuscular disorder (HCC)    Seizures (HCC)    Last seizure 2014   Sleep apnea    Trigeminal neuralgia    Past Surgical History:  Procedure Laterality Date   BREAST ENHANCEMENT SURGERY  2008   BREAST SURGERY     CESAREAN SECTION N/A 06/13/2015   Procedure: CESAREAN SECTION;  Surgeon: Lenord Radon, MD;  Location: [facility];  Service: Obstetrics;  Laterality: N/A;   COSMETIC SURGERY     ESOPHAGOGASTRODUODENOSCOPY (EGD) WITH PROPOFOL  N/A 08/30/2019   Procedure: ESOPHAGOGASTRODUODENOSCOPY (EGD) WITH PROPOFOL ;  Surgeon: Irby Mannan, MD;  Location: [facility];  Service: Endoscopy;  Laterality: N/A;   wisdom teeth     Social History   Socioeconomic History   Marital status: Divorced    Spouse name: Not on file   Number of children: Not on file   Years of education: Not on file   Highest education level: Not on file  Occupational History   Not on file  Tobacco Use   Smoking status: Former   Smokeless tobacco: Former  Haematologist status: Never Used  Substance and Sexual Activity   Alcohol use: Yes    Comment: socially   Drug use: No   Sexual activity: Yes    Partners: Male    Birth control/protection: Pill  Other Topics Concern   Not on file  Social History Narrative   Not on file   Social Drivers of Health   Financial Resource Strain: Not on file  Food Insecurity: Not on file  Transportation Needs: Not on file  Physical Activity: Not on file  Stress: Not on file  Social Connections: Not on file   Family History  Problem Relation Age of Onset   Hyperlipidemia Mother    Arthritis Mother    Depression Mother    Arthritis Father    Depression Father    Alcohol abuse Father    Allergies  Allergen Reactions   Egg-Derived Products Rash   Erythromycin Rash   Paroxetine Palpitations   Sulfonamide Derivatives Rash   Current Outpatient Medications  Medication Sig Dispense Refill   acetaminophen  (TYLENOL ) 500 MG tablet Take 1 tablet (500 mg total) by mouth every 8 (eight) hours for 10 days. 30 tablet 0   aspirin EC 325 MG tablet Take 1 tablet (325 mg total) by mouth daily. 14 tablet 0   ibuprofen  (ADVIL ) 800 MG tablet Take 1 tablet (800  mg total) by mouth every 8 (eight) hours for 10 days. Please take with food, please alternate with acetaminophen  30 tablet 0   norethindrone  (CAMILA ) 0.35 MG tablet Take 1 tablet (0.35 mg total) by mouth daily. 90 tablet 3   oxyCODONE  (ROXICODONE ) 5 MG immediate release tablet Take 1 tablet (5 mg total) by mouth every 4 (four) hours as needed for severe pain (pain score 7-10) or breakthrough pain. 10 tablet 0   cetirizine  (ZYRTEC  ALLERGY) 10 MG tablet Take 1 tablet (10 mg total) by mouth at bedtime. 90 tablet 1   EPINEPHrine  0.3 mg/0.3 mL IJ SOAJ injection Inject 0.3 mg into the muscle as needed for anaphylaxis. 1 each 1   famotidine  (PEPCID ) 20 MG tablet Take 1 tablet (20 mg total) by mouth 2 (two) times daily. 60 tablet 0   fluticasone  (FLONASE ) 50 MCG/ACT nasal  spray Place 2 sprays into both nostrils daily. 16 g 0   HYDROcodone -acetaminophen  (NORCO/VICODIN) 5-325 MG tablet Take 1-2 tablets by mouth daily as needed for moderate pain (pain score 4-6). 10 tablet 0   hydrOXYzine  (ATARAX /VISTARIL ) 25 MG tablet TAKE ONE TABLET BY MOUTH EVERY 6 HOURS AS NEEDED FOR ITCHING 30 tablet 1   ipratropium (ATROVENT ) 0.06 % nasal spray Place 2 sprays into both nostrils 3 (three) times daily. As needed for nasal congestion, runny nose 15 mL 1   Lasmiditan  Succinate (REYVOW ) 100 MG TABS Take 1 tablet (100 mg total) by mouth as needed. 8 tablet 3   meloxicam  (MOBIC ) 15 MG tablet Take 1 tablet (15 mg total) by mouth daily. 30 tablet 0   methylPREDNISolone  (MEDROL  DOSEPAK) 4 MG TBPK tablet Follow instructions on package 1 each 0   omalizumab (XOLAIR) 150 MG/ML prefilled syringe Inject into the skin every 14 (fourteen) days.     propranolol  (INDERAL ) 20 MG tablet Take 1 tablet (20 mg total) by mouth 2 (two) times daily. 180 tablet 1   scopolamine  (TRANSDERM-SCOP) 1 MG/3DAYS Place 1 patch (1.5 mg total) onto the skin every 3 (three) days. 3 patch 0   tiZANidine  (ZANAFLEX ) 4 MG tablet Take 2 tablets (8 mg total) by mouth at bedtime as needed for muscle spasms. 60 tablet 0   topiramate  (TOPAMAX ) 200 MG tablet Take 1 tablet (200 mg total) by mouth daily. 90 tablet 1   Ubrogepant  (UBRELVY ) 100 MG TABS Take 1 tablet (100 mg total) by mouth as needed (migraine). 48 tablet 1   No current facility-administered medications for this visit.   No results found.  Review of Systems:   A ROS was performed including pertinent positives and negatives as documented in the HPI.  Physical Exam :   Constitutional: NAD and appears stated age Neurological: Alert and oriented Psych: Appropriate affect and cooperative There were no vitals taken for this visit.   Comprehensive Musculoskeletal Exam:    Tenderness about femoral acetabular joint pain with 30 degrees internal rotation positive  FADIR.  Negative FABER she does have full knee extension and flexion.  Distal neurosensory exam is intact from sensation perspective   Imaging:   Xray (lumbar spine 4 views): Normal  X-ray 3 views left hip: Normal  MRI left hip: Anterior superior labral tear  I personally reviewed and interpreted the radiographs.   Assessment and Plan:   38 y.o. female with today with evidence of left hip pain consistent with hip labral tearing and instability.  She did get 1 week of extremely good relief from her injection.  I did discuss that her  MRI does confirm an anterior superior labral tear.  Given this we did discuss the possibility of hip arthroscopy with root repair.  I discussed the risks and limitations.  I did discuss the associated rehab and recovery.  After discussion she would like to proceed with this  -Plan for left hip arthroscopy with labral repair   After a lengthy discussion of treatment options, including risks, benefits, alternatives, complications of surgical and nonsurgical conservative options, the patient elected surgical repair.   The patient  is aware of the material risks  and complications including, but not limited to injury to adjacent structures, neurovascular injury, infection, numbness, bleeding, implant failure, thermal burns, stiffness, persistent pain, failure to heal, disease transmission from allograft, need for further surgery, dislocation, anesthetic risks, blood clots, risks of death,and others. The probabilities of surgical success and failure discussed with patient given their particular co-morbidities.The time and nature of expected rehabilitation and recovery was discussed.The patient's questions were all answered preoperatively.  No barriers to understanding were noted. I explained the natural history of the disease process and Rx rationale.  I explained to the patient what I considered to be reasonable expectations given their personal situation.  The final  treatment plan was arrived at through a shared patient decision making process model.   I personally saw and evaluated the patient, and participated in the management and treatment plan.  Sharon Harada, MD Attending Physician, Orthopedic Surgery  This document was dictated using Dragon voice recognition software. A reasonable attempt at proof reading has been made to minimize errors.

## 2023-09-16 ENCOUNTER — Encounter (HOSPITAL_BASED_OUTPATIENT_CLINIC_OR_DEPARTMENT_OTHER): Payer: Self-pay | Admitting: Orthopaedic Surgery

## 2023-09-16 ENCOUNTER — Other Ambulatory Visit (HOSPITAL_BASED_OUTPATIENT_CLINIC_OR_DEPARTMENT_OTHER): Payer: Self-pay | Admitting: Orthopaedic Surgery

## 2023-09-16 MED ORDER — METHYLPREDNISOLONE 4 MG PO TBPK: ORAL_TABLET | ORAL | 0 refills | Status: DC

## 2023-09-21 ENCOUNTER — Other Ambulatory Visit (HOSPITAL_BASED_OUTPATIENT_CLINIC_OR_DEPARTMENT_OTHER): Payer: Self-pay

## 2023-09-27 ENCOUNTER — Encounter: Payer: Self-pay | Admitting: Obstetrics and Gynecology

## 2023-09-30 ENCOUNTER — Other Ambulatory Visit: Payer: Self-pay | Admitting: Obstetrics and Gynecology

## 2023-09-30 DIAGNOSIS — N83201 Unspecified ovarian cyst, right side: Secondary | ICD-10-CM | POA: Insufficient documentation

## 2023-10-01 ENCOUNTER — Encounter (HOSPITAL_BASED_OUTPATIENT_CLINIC_OR_DEPARTMENT_OTHER): Payer: Self-pay | Admitting: Orthopaedic Surgery

## 2023-10-01 ENCOUNTER — Telehealth: Payer: Self-pay

## 2023-10-01 NOTE — Telephone Encounter (Signed)
 Called pt to notify of US  appt and details. Left vm for pt to call office back

## 2023-10-06 ENCOUNTER — Other Ambulatory Visit (HOSPITAL_BASED_OUTPATIENT_CLINIC_OR_DEPARTMENT_OTHER): Payer: Self-pay

## 2023-10-18 ENCOUNTER — Encounter (HOSPITAL_BASED_OUTPATIENT_CLINIC_OR_DEPARTMENT_OTHER): Payer: Self-pay | Admitting: Orthopaedic Surgery

## 2023-10-18 ENCOUNTER — Other Ambulatory Visit: Payer: Self-pay

## 2023-10-20 ENCOUNTER — Ambulatory Visit
Admission: RE | Admit: 2023-10-20 | Discharge: 2023-10-20 | Disposition: A | Discharge status: Home / Self Care | Source: Ambulatory Visit | Attending: Obstetrics and Gynecology | Admitting: Obstetrics and Gynecology

## 2023-10-20 DIAGNOSIS — N83201 Unspecified ovarian cyst, right side: Secondary | ICD-10-CM | POA: Insufficient documentation

## 2023-10-23 ENCOUNTER — Encounter: Payer: Self-pay | Admitting: Family Medicine

## 2023-10-25 ENCOUNTER — Ambulatory Visit (HOSPITAL_BASED_OUTPATIENT_CLINIC_OR_DEPARTMENT_OTHER): Admitting: Anesthesiology

## 2023-10-25 ENCOUNTER — Encounter: Payer: Self-pay | Admitting: Obstetrics and Gynecology

## 2023-10-25 ENCOUNTER — Other Ambulatory Visit: Payer: Self-pay

## 2023-10-25 ENCOUNTER — Ambulatory Visit (HOSPITAL_COMMUNITY)

## 2023-10-25 ENCOUNTER — Encounter (HOSPITAL_BASED_OUTPATIENT_CLINIC_OR_DEPARTMENT_OTHER)
Admission: RE | Disposition: A | Discharge status: Home / Self Care | Payer: Self-pay | Source: Home / Self Care | Attending: Orthopaedic Surgery

## 2023-10-25 ENCOUNTER — Ambulatory Visit (HOSPITAL_BASED_OUTPATIENT_CLINIC_OR_DEPARTMENT_OTHER)
Admission: RE | Admit: 2023-10-25 | Discharge: 2023-10-25 | Disposition: A | Discharge status: Home / Self Care | Attending: Orthopaedic Surgery | Admitting: Orthopaedic Surgery

## 2023-10-25 ENCOUNTER — Ambulatory Visit: Payer: Self-pay | Admitting: Obstetrics and Gynecology

## 2023-10-25 DIAGNOSIS — R519 Headache, unspecified: Secondary | ICD-10-CM | POA: Insufficient documentation

## 2023-10-25 DIAGNOSIS — M62838 Other muscle spasm: Secondary | ICD-10-CM

## 2023-10-25 DIAGNOSIS — Z791 Long term (current) use of non-steroidal anti-inflammatories (NSAID): Secondary | ICD-10-CM | POA: Diagnosis not present

## 2023-10-25 DIAGNOSIS — Z8261 Family history of arthritis: Secondary | ICD-10-CM | POA: Insufficient documentation

## 2023-10-25 DIAGNOSIS — S73192A Other sprain of left hip, initial encounter: Secondary | ICD-10-CM | POA: Diagnosis not present

## 2023-10-25 DIAGNOSIS — M25552 Pain in left hip: Secondary | ICD-10-CM

## 2023-10-25 DIAGNOSIS — R569 Unspecified convulsions: Secondary | ICD-10-CM | POA: Diagnosis not present

## 2023-10-25 DIAGNOSIS — Z7982 Long term (current) use of aspirin: Secondary | ICD-10-CM | POA: Diagnosis not present

## 2023-10-25 DIAGNOSIS — J45909 Unspecified asthma, uncomplicated: Secondary | ICD-10-CM | POA: Diagnosis not present

## 2023-10-25 DIAGNOSIS — Z79899 Other long term (current) drug therapy: Secondary | ICD-10-CM | POA: Diagnosis not present

## 2023-10-25 DIAGNOSIS — G4730 Sleep apnea, unspecified: Secondary | ICD-10-CM | POA: Diagnosis not present

## 2023-10-25 DIAGNOSIS — X58XXXA Exposure to other specified factors, initial encounter: Secondary | ICD-10-CM | POA: Diagnosis not present

## 2023-10-25 DIAGNOSIS — K219 Gastro-esophageal reflux disease without esophagitis: Secondary | ICD-10-CM | POA: Insufficient documentation

## 2023-10-25 DIAGNOSIS — Z01818 Encounter for other preprocedural examination: Secondary | ICD-10-CM

## 2023-10-25 DIAGNOSIS — F419 Anxiety disorder, unspecified: Secondary | ICD-10-CM | POA: Diagnosis not present

## 2023-10-25 HISTORY — DX: Leiomyoma of uterus, unspecified: D25.9

## 2023-10-25 HISTORY — PX: HIP ARTHROSCOPY W/ LABRAL REPAIR: SHX1750

## 2023-10-25 LAB — POCT PREGNANCY, URINE: Preg Test, Ur: NEGATIVE

## 2023-10-25 SURGERY — ARTHROSCOPY, HIP, WITH LABRUM REPAIR
Anesthesia: General | Site: Hip | Laterality: Left

## 2023-10-25 MED ORDER — PROPOFOL 10 MG/ML IV BOLUS
INTRAVENOUS | Status: DC | PRN
  Administered 2023-10-25: 120 ug via INTRAVENOUS

## 2023-10-25 MED ORDER — KETOROLAC TROMETHAMINE 30 MG/ML IJ SOLN
INTRAMUSCULAR | Status: AC
  Filled 2023-10-25: qty 1

## 2023-10-25 MED ORDER — ROCURONIUM BROMIDE 10 MG/ML (PF) SYRINGE
PREFILLED_SYRINGE | INTRAVENOUS | Status: AC
  Filled 2023-10-25: qty 10

## 2023-10-25 MED ORDER — FENTANYL CITRATE (PF) 100 MCG/2ML IJ SOLN
INTRAMUSCULAR | Status: AC
Start: 2023-10-25 — End: 2023-10-25
  Filled 2023-10-25: qty 2

## 2023-10-25 MED ORDER — ONDANSETRON HCL 4 MG/2ML IJ SOLN: 4.0000 mg | Freq: Once | INTRAMUSCULAR | Status: DC | PRN

## 2023-10-25 MED ORDER — OXYCODONE HCL 5 MG PO TABS: 5.0000 mg | ORAL_TABLET | Freq: Once | ORAL | Status: DC | PRN

## 2023-10-25 MED ORDER — ACETAMINOPHEN 500 MG PO TABS
ORAL_TABLET | ORAL | Status: AC
  Filled 2023-10-25: qty 2

## 2023-10-25 MED ORDER — DEXAMETHASONE SODIUM PHOSPHATE 10 MG/ML IJ SOLN
INTRAMUSCULAR | Status: DC | PRN
  Administered 2023-10-25: 10 mg via INTRAVENOUS

## 2023-10-25 MED ORDER — OXYCODONE HCL 5 MG/5ML PO SOLN: 5.0000 mg | Freq: Once | ORAL | Status: DC | PRN

## 2023-10-25 MED ORDER — KETOROLAC TROMETHAMINE 30 MG/ML IJ SOLN
INTRAMUSCULAR | Status: DC | PRN
  Administered 2023-10-25: 30 mg via INTRAVENOUS

## 2023-10-25 MED ORDER — HYDROMORPHONE HCL 1 MG/ML IJ SOLN
INTRAMUSCULAR | Status: DC | PRN
  Administered 2023-10-25: .5 mg via INTRAVENOUS

## 2023-10-25 MED ORDER — PHENYLEPHRINE 80 MCG/ML (10ML) SYRINGE FOR IV PUSH (FOR BLOOD PRESSURE SUPPORT)
PREFILLED_SYRINGE | INTRAVENOUS | Status: DC | PRN
  Administered 2023-10-25: 80 ug via INTRAVENOUS

## 2023-10-25 MED ORDER — BUPIVACAINE HCL 0.25 % IJ SOLN
INTRAMUSCULAR | Status: DC | PRN
  Administered 2023-10-25: 20 mL

## 2023-10-25 MED ORDER — LACTATED RINGERS IV SOLN: INTRAVENOUS | Status: DC

## 2023-10-25 MED ORDER — GABAPENTIN 300 MG PO CAPS
ORAL_CAPSULE | ORAL | Status: AC
  Filled 2023-10-25: qty 1

## 2023-10-25 MED ORDER — TRANEXAMIC ACID-NACL 1000-0.7 MG/100ML-% IV SOLN
INTRAVENOUS | Status: AC
  Filled 2023-10-25: qty 100

## 2023-10-25 MED ORDER — CEFAZOLIN SODIUM-DEXTROSE 2-4 GM/100ML-% IV SOLN
INTRAVENOUS | Status: AC
  Filled 2023-10-25: qty 100

## 2023-10-25 MED ORDER — SCOPOLAMINE 1 MG/3DAYS TD PT72
MEDICATED_PATCH | TRANSDERMAL | Status: AC
  Filled 2023-10-25: qty 1

## 2023-10-25 MED ORDER — HYDROMORPHONE HCL 1 MG/ML IJ SOLN
INTRAMUSCULAR | Status: AC
  Filled 2023-10-25: qty 0.5

## 2023-10-25 MED ORDER — LIDOCAINE 2% (20 MG/ML) 5 ML SYRINGE
INTRAMUSCULAR | Status: AC
  Filled 2023-10-25: qty 5

## 2023-10-25 MED ORDER — ACETAMINOPHEN 500 MG PO TABS
1000.0000 mg | ORAL_TABLET | Freq: Once | ORAL | Status: AC
  Administered 2023-10-25: 1000 mg via ORAL

## 2023-10-25 MED ORDER — ONDANSETRON HCL 4 MG/2ML IJ SOLN
INTRAMUSCULAR | Status: DC | PRN
Start: 2023-10-25 — End: 2023-10-25
  Administered 2023-10-25: 4 mg via INTRAVENOUS

## 2023-10-25 MED ORDER — FENTANYL CITRATE (PF) 100 MCG/2ML IJ SOLN
INTRAMUSCULAR | Status: DC | PRN
  Administered 2023-10-25 (×2): 50 ug via INTRAVENOUS

## 2023-10-25 MED ORDER — TRANEXAMIC ACID-NACL 1000-0.7 MG/100ML-% IV SOLN
1000.0000 mg | INTRAVENOUS | Status: AC
  Administered 2023-10-25: 1000 mg via INTRAVENOUS

## 2023-10-25 MED ORDER — SODIUM CHLORIDE 0.9 % IR SOLN
Status: DC | PRN
  Administered 2023-10-25: 6000 mL

## 2023-10-25 MED ORDER — LIDOCAINE 2% (20 MG/ML) 5 ML SYRINGE
INTRAMUSCULAR | Status: DC | PRN
  Administered 2023-10-25: 100 mg via INTRAVENOUS

## 2023-10-25 MED ORDER — SUGAMMADEX SODIUM 200 MG/2ML IV SOLN
INTRAVENOUS | Status: DC | PRN
Start: 2023-10-25 — End: 2023-10-25
  Administered 2023-10-25: 200 mg via INTRAVENOUS

## 2023-10-25 MED ORDER — GABAPENTIN 300 MG PO CAPS
300.0000 mg | ORAL_CAPSULE | Freq: Once | ORAL | Status: AC
  Administered 2023-10-25: 300 mg via ORAL

## 2023-10-25 MED ORDER — BUPIVACAINE HCL (PF) 0.25 % IJ SOLN
INTRAMUSCULAR | Status: AC
  Filled 2023-10-25: qty 30

## 2023-10-25 MED ORDER — ONDANSETRON HCL 4 MG/2ML IJ SOLN
INTRAMUSCULAR | Status: AC
  Filled 2023-10-25: qty 2

## 2023-10-25 MED ORDER — DEXMEDETOMIDINE HCL IN NACL 80 MCG/20ML IV SOLN
INTRAVENOUS | Status: DC | PRN
  Administered 2023-10-25: 8 ug via INTRAVENOUS

## 2023-10-25 MED ORDER — ACETAMINOPHEN 10 MG/ML IV SOLN: 1000.0000 mg | Freq: Once | INTRAVENOUS | Status: DC | PRN

## 2023-10-25 MED ORDER — FENTANYL CITRATE (PF) 100 MCG/2ML IJ SOLN
25.0000 ug | INTRAMUSCULAR | Status: DC | PRN
  Administered 2023-10-25 (×3): 50 ug via INTRAVENOUS

## 2023-10-25 MED ORDER — CEFAZOLIN SODIUM-DEXTROSE 2-4 GM/100ML-% IV SOLN
2.0000 g | INTRAVENOUS | Status: AC
  Administered 2023-10-25: 2 g via INTRAVENOUS

## 2023-10-25 MED ORDER — MIDAZOLAM HCL 2 MG/2ML IJ SOLN
INTRAMUSCULAR | Status: AC
  Filled 2023-10-25: qty 2

## 2023-10-25 MED ORDER — ROCURONIUM 10MG/ML (10ML) SYRINGE FOR MEDFUSION PUMP - OPTIME
INTRAVENOUS | Status: DC | PRN
  Administered 2023-10-25: 50 mg via INTRAVENOUS

## 2023-10-25 MED ORDER — MIDAZOLAM HCL 5 MG/5ML IJ SOLN
INTRAMUSCULAR | Status: DC | PRN
  Administered 2023-10-25: 2 mg via INTRAVENOUS

## 2023-10-25 MED ORDER — SUGAMMADEX SODIUM 200 MG/2ML IV SOLN
INTRAVENOUS | Status: AC
  Filled 2023-10-25: qty 2

## 2023-10-25 MED ORDER — DEXAMETHASONE SODIUM PHOSPHATE 10 MG/ML IJ SOLN
INTRAMUSCULAR | Status: AC
  Filled 2023-10-25: qty 1

## 2023-10-25 SURGICAL SUPPLY — 57 items
ANCHOR SUT 1.4 FLEX (Anchor) IMPLANT
BIT DRILL FLEX NANOTACK (BIT) IMPLANT
BLADE SAMURAI STR FULL RADIUS (BLADE) IMPLANT
BLADE SURG 11 STRL SS (BLADE) ×1 IMPLANT
CANISTER SUCT 1200ML W/VALVE (MISCELLANEOUS) ×1 IMPLANT
CANNULA OBTURATOR FLOWPORT ST5 (CANNULA) IMPLANT
CHLORAPREP W/TINT 26 (MISCELLANEOUS) ×1 IMPLANT
COOLER ICEMAN CLASSIC (MISCELLANEOUS) ×1 IMPLANT
COVER BACK TABLE 60X90IN (DRAPES) ×1 IMPLANT
COVER MAYO STAND STRL (DRAPES) ×2 IMPLANT
DERMABOND ADVANCED .7 DNX12 (GAUZE/BANDAGES/DRESSINGS) IMPLANT
DISSECTOR 4.2MMX19CM HL (MISCELLANEOUS) ×1 IMPLANT
DRAPE C-ARM 42X72 X-RAY (DRAPES) ×1 IMPLANT
DRAPE STERI IOBAN 125X83 (DRAPES) IMPLANT
DRAPE U-SHAPE 47X51 STRL (DRAPES) ×2 IMPLANT
DRSG TEGADERM 4X4.75 (GAUZE/BANDAGES/DRESSINGS) ×3 IMPLANT
FEE RENTAL EQUIP HIP INSTR KIT (INSTRUMENTS) IMPLANT
GAUZE PAD ABD 8X10 STRL (GAUZE/BANDAGES/DRESSINGS) IMPLANT
GAUZE SPONGE 4X4 12PLY STRL (GAUZE/BANDAGES/DRESSINGS) ×1 IMPLANT
GAUZE XEROFORM 1X8 LF (GAUZE/BANDAGES/DRESSINGS) ×1 IMPLANT
GLOVE BIO SURGEON STRL SZ 6 (GLOVE) ×2 IMPLANT
GLOVE BIO SURGEON STRL SZ7.5 (GLOVE) ×2 IMPLANT
GLOVE BIOGEL PI IND STRL 6.5 (GLOVE) ×1 IMPLANT
GLOVE BIOGEL PI IND STRL 8 (GLOVE) ×1 IMPLANT
GOWN STRL REUS W/ TWL LRG LVL3 (GOWN DISPOSABLE) ×2 IMPLANT
GOWN STRL REUS W/TWL XL LVL3 (GOWN DISPOSABLE) ×1 IMPLANT
INSTRUMENT ORTHO TEXT HIP FEM (INSTRUMENTS) IMPLANT
KIT PATIENT POSITION MEDIUM (KITS) IMPLANT
KIT PORTAL ENTRY HIP ACCESS (KITS) IMPLANT
MANIFOLD NEPTUNE II (INSTRUMENTS) ×1 IMPLANT
NDL HYPO 22X1.5 SAFETY MO (MISCELLANEOUS) IMPLANT
NDL INJECTOR II CARTRIDGE (MISCELLANEOUS) IMPLANT
NDL SPNL 18GX3.5 QUINCKE PK (NEEDLE) ×1 IMPLANT
NDL SUT 6 .5 CRC .975X.05 MAYO (NEEDLE) IMPLANT
NEEDLE HYPO 22X1.5 SAFETY MO (MISCELLANEOUS) IMPLANT
NEEDLE INJECTOR II CARTRIDGE (MISCELLANEOUS) ×1 IMPLANT
NEEDLE SPNL 18GX3.5 QUINCKE PK (NEEDLE) ×1 IMPLANT
PACK BASIN DAY SURGERY FS (CUSTOM PROCEDURE TRAY) ×1 IMPLANT
PAD COLD SHLDR WRAP-ON (PAD) ×1 IMPLANT
PASSER SUT 1.5D CRESCENT (INSTRUMENTS) IMPLANT
SPIKE FLUID TRANSFER (MISCELLANEOUS) IMPLANT
SPONGE T-LAP 18X18 ~~LOC~~+RFID (SPONGE) IMPLANT
SUCTION TUBE FRAZIER 10FR DISP (SUCTIONS) IMPLANT
SUT ETHILON 3 0 PS 1 (SUTURE) ×1 IMPLANT
SUT VIC AB 0 CT1 27XBRD ANBCTR (SUTURE) IMPLANT
SUT VIC AB 2-0 CT1 TAPERPNT 27 (SUTURE) IMPLANT
SUT XBRAID 1.4 BLUE/BLACK (SUTURE) IMPLANT
SUTURE FIBERWR #2 38 T-5 BLUE (SUTURE) IMPLANT
SUTURE TAPE 1.3 FIBERLOP 20 ST (SUTURE) IMPLANT
SYR 20ML LL LF (SYRINGE) IMPLANT
SYR 50ML LL SCALE MARK (SYRINGE) ×1 IMPLANT
TOWEL GREEN STERILE FF (TOWEL DISPOSABLE) ×2 IMPLANT
TRAY ARTHROSCOPY HIP STRE FEE (INSTRUMENTS) ×1 IMPLANT
TRAY PIVOT PORT STRE FEE (INSTRUMENTS) ×1 IMPLANT
TUBE CONNECTING 20X1/4 (TUBING) ×3 IMPLANT
TUBING ARTHROSCOPY IRRIG 16FT (MISCELLANEOUS) ×1 IMPLANT
WAND APOLLO RF 50D ABLATOR (BUR) ×1 IMPLANT

## 2023-10-25 NOTE — Discharge Instructions (Addendum)
 Discharge Instructions    Attending Surgeon: Elspeth Parker, MD Office Phone Number: [phone]   Diagnosis and Procedures:    Surgeries Performed: Left hip labral tear  Discharge Plan:    Diet: Resume usual diet. Begin with light or bland foods.  Drink plenty of fluids.  Activity:  Weight bearing as tolerated left leg. You are advised to go home directly from the hospital or surgical center. Restrict your activities.  GENERAL INSTRUCTIONS: 1.  Please apply ice to your wound to help with swelling and inflammation. This will improve your comfort and your overall recovery following surgery.     2. Please call Dr. Danetta office at [phone] with questions Monday-Friday during business hours. If no one answers, please leave a message and someone should get back to the patient within 24 hours. For emergencies please call 911 or proceed to the emergency room.   3. Patient to notify surgical team if experiences any of the following: Bowel/Bladder dysfunction, uncontrolled pain, nerve/muscle weakness, incision with increased drainage or redness, nausea/vomiting and Fever greater than 101.0 F.  Be alert for signs of infection including redness, streaking, odor, fever or chills. Be alert for excessive pain or bleeding and notify your surgeon immediately.  WOUND INSTRUCTIONS:   Leave your dressing, cast, or splint in place until your post operative visit.  Keep it clean and dry.  Always keep the incision clean and dry until the staples/sutures are removed. If there is no drainage from the incision you should keep it open to air. If there is drainage from the incision you must keep it covered at all times until the drainage stops  Do not soak in a bath tub, hot tub, pool, lake or other body of water until 21 days after your surgery and your incision is completely dry and healed.  If you have removable sutures (or staples) they must be removed 10-14 days (unless otherwise  instructed) from the day of your surgery.     1)  Elevate the extremity as much as possible.  2)  Keep the dressing clean and dry.  3)  Please call us  if the dressing becomes wet or dirty.  4)  If you are experiencing worsening pain or worsening swelling, please call.     MEDICATIONS: Resume all previous home medications at the previous prescribed dose and frequency unless otherwise noted Start taking the  pain medications on an as-needed basis as prescribed  Please taper down pain medication over the next week following surgery.  Ideally you should not require a refill of any narcotic pain medication.  Take pain medication with food to minimize nausea. In addition to the prescribed pain medication, you may take over-the-counter pain relievers such as Tylenol .  Do NOT take additional tylenol  if your pain medication already has tylenol  in it.  Aspirin  325mg  daily per instructions on bottle. Narcotic policy: Per [facility] clinic policy, our goal is ensure optimal postoperative pain control with a multimodal pain management strategy. For all [facility] patients, our goal is to wean post-operative narcotic medications by 6 weeks post-operatively, and many times sooner. If this is not possible due to utilization of pain medication prior to surgery, your [facility] doctor will support your acute post-operative pain control for the first 6 weeks postoperatively, with a plan to transition you back to your primary pain team following that. Maralee will work to ensure a Therapist, occupational.       FOLLOWUP INSTRUCTIONS: 1. Follow up at the Physical Therapy  Clinic 3-4 days following surgery. This appointment should be scheduled unless other arrangements have been made.The Physical Therapy scheduling number is [phone] if an appointment has not already been arranged.  2. Contact Dr. Danetta office during office hours at [phone] or the practice after hours line at [phone] for non-emergencies.  For medical emergencies call 911.   Discharge Location: Home  Last Tylenol  given at 10:47am today Last NSAIDs given at 2:15pm today  Post Anesthesia Home Care Instructions  Activity: Get plenty of rest for the remainder of the day. A responsible individual must stay with you for 24 hours following the procedure.  For the next 24 hours, DO NOT: -Drive a car -Advertising copywriter -Drink alcoholic beverages -Take any medication unless instructed by your physician -Make any legal decisions or sign important papers.  Meals: Start with liquid foods such as gelatin or soup. Progress to regular foods as tolerated. Avoid greasy, spicy, heavy foods. If nausea and/or vomiting occur, drink only clear liquids until the nausea and/or vomiting subsides. Call your physician if vomiting continues.  Special Instructions/Symptoms: Your throat may feel dry or sore from the anesthesia or the breathing tube placed in your throat during surgery. If this causes discomfort, gargle with warm salt water. The discomfort should disappear within 24 hours.  If you had a scopolamine  patch placed behind your ear for the management of post- operative nausea and/or vomiting:  1. The medication in the patch is effective for 72 hours, after which it should be removed.  Wrap patch in a tissue and discard in the trash. Wash hands thoroughly with soap and water. 2. You may remove the patch earlier than 72 hours if you experience unpleasant side effects which may include dry mouth, dizziness or visual disturbances. 3. Avoid touching the patch. Wash your hands with soap and water after contact with the patch.    Regional Anesthesia Blocks  1. You may not be able to move or feel the blocked extremity after a regional anesthetic block. This may last may last from 3-48 hours after placement, but it will go away. The length of time depends on the medication injected and your individual response to the medication. As the nerves  start to wake up, you may experience tingling as the movement and feeling returns to your extremity. If the numbness and inability to move your extremity has not gone away after 48 hours, please call your surgeon.   2. The extremity that is blocked will need to be protected until the numbness is gone and the strength has returned. Because you cannot feel it, you will need to take extra care to avoid injury. Because it may be weak, you may have difficulty moving it or using it. You may not know what position it is in without looking at it while the block is in effect.  3. For blocks in the legs and feet, returning to weight bearing and walking needs to be done carefully. You will need to wait until the numbness is entirely gone and the strength has returned. You should be able to move your leg and foot normally before you try and bear weight or walk. You will need someone to be with you when you first try to ensure you do not fall and possibly risk injury.  4. Bruising and tenderness at the needle site are common side effects and will resolve in a few days.  5. Persistent numbness or new problems with movement should be communicated to the surgeon or  the [facility] [phone] [facility] [phone]).

## 2023-10-25 NOTE — Transfer of Care (Signed)
 Immediate Anesthesia Transfer of Care Note  Patient: Sharon King  Procedure(s) Performed: ARTHROSCOPY, HIP, WITH LABRUM REPAIR (Left: Hip)  Patient Location: PACU  Anesthesia Type:General  Level of Consciousness: alert , oriented, and patient cooperative  Airway & Oxygen Therapy: Patient Spontanous Breathing and Patient connected to face mask oxygen  Post-op Assessment: Report given to RN and Post -op Vital signs reviewed and stable  Post vital signs: Reviewed and stable  Last Vitals:  Vitals Value Taken Time  BP 111/98 10/25/23 14:32  Temp 36.7 C 10/25/23 14:32  Pulse 82 10/25/23 14:40  Resp 16 10/25/23 14:40  SpO2 100 % 10/25/23 14:40  Vitals shown include unfiled device data.  Last Pain:  Vitals:   10/25/23 1432  TempSrc:   PainSc: 5       Patients Stated Pain Goal: 4 (10/25/23 1040)  Complications: No notable events documented.

## 2023-10-25 NOTE — Brief Op Note (Signed)
   Brief Op Note  Date of Surgery: 10/25/2023  Preoperative Diagnosis: LEFT HIP LABRAL TEAR  Postoperative Diagnosis: same  Procedure: Procedure(s): ARTHROSCOPY, HIP, WITH LABRUM REPAIR  Implants: Implant Name Type Inv. Item Serial No. Manufacturer Lot No. LRB No. Used Action  ANCHOR SUT 1.4 FLEX - [id number] Anchor ANCHOR SUT 1.4 FLEX  [employer] [id number] Left 2 Implanted    Surgeons: Surgeon(s): Genelle Standing, MD  Anesthesia: General    Estimated Blood Loss: See anesthesia record  Complications: None  Condition to PACU: Stable  Standing LITTIE Genelle, MD 10/25/2023 2:34 PM

## 2023-10-25 NOTE — H&P (Signed)
 Expand All Collapse All       Chief Complaint: Radiating bilateral lower leg pain        History of Present Illness:    09/15/2023: Presents today for MRI follow-up.  She did get extremely good relief from her injection in the left hip.  She is here today for further discussion and MRI follow-up   Sharon King is a 38 y.o. female presents today for follow-up with bilateral radiating leg pain as well as numbness and paresthesias over the anterior lateral right worse than left thigh.  She has been experiencing pain with urination as well recently.  This is are all new symptoms.  She was placed on a Medrol  Dosepak with Dr. Jerri on any relief.  She is here today for further discussion       PMH/PSH/Family History/Social History/Meds/Allergies:         Past Medical History:  Diagnosis Date   Acute upper GI bleed 08/02/2019   Allergy     Anxiety     Asthma     Cancer (HCC)     GERD (gastroesophageal reflux disease)     History of preterm delivery     Migraines     Neuromuscular disorder (HCC)     Seizures (HCC)      Last seizure 2014   Sleep apnea     Trigeminal neuralgia               Past Surgical History:  Procedure Laterality Date   BREAST ENHANCEMENT SURGERY   2008   BREAST SURGERY       CESAREAN SECTION N/A 06/13/2015    Procedure: CESAREAN SECTION;  Surgeon: Elveria Mungo, MD;  Location: [facility];  Service: Obstetrics;  Laterality: N/A;   COSMETIC SURGERY       ESOPHAGOGASTRODUODENOSCOPY (EGD) WITH PROPOFOL  N/A 08/30/2019    Procedure: ESOPHAGOGASTRODUODENOSCOPY (EGD) WITH PROPOFOL ;  Surgeon: Janalyn Keene NOVAK, MD;  Location: [facility];  Service: Endoscopy;  Laterality: N/A;   wisdom teeth            Social History         Socioeconomic History   Marital status: Divorced      Spouse name: Not on file   Number of children: Not on file   Years of education: Not on file   Highest education level: Not on file  Occupational History   Not on  file  Tobacco Use   Smoking status: Former   Smokeless tobacco: Former  Building services engineer status: Never Used  Substance and Sexual Activity   Alcohol use: Yes      Comment: socially   Drug use: No   Sexual activity: Yes      Partners: Male      Birth control/protection: Pill  Other Topics Concern   Not on file  Social History Narrative   Not on file    Social Drivers of Health    Financial Resource Strain: Not on file  Food Insecurity: Not on file  Transportation Needs: Not on file  Physical Activity: Not on file  Stress: Not on file  Social Connections: Not on file         Family History  Problem Relation Age of Onset   Hyperlipidemia Mother     Arthritis Mother     Depression Mother     Arthritis Father     Depression Father     Alcohol abuse Father  Allergies      Allergies  Allergen Reactions   Egg-Derived Products Rash   Erythromycin Rash   Paroxetine Palpitations   Sulfonamide Derivatives Rash            Current Outpatient Medications  Medication Sig Dispense Refill   acetaminophen  (TYLENOL ) 500 MG tablet Take 1 tablet (500 mg total) by mouth every 8 (eight) hours for 10 days. 30 tablet 0   aspirin  EC 325 MG tablet Take 1 tablet (325 mg total) by mouth daily. 14 tablet 0   ibuprofen  (ADVIL ) 800 MG tablet Take 1 tablet (800 mg total) by mouth every 8 (eight) hours for 10 days. Please take with food, please alternate with acetaminophen  30 tablet 0   norethindrone  (CAMILA ) 0.35 MG tablet Take 1 tablet (0.35 mg total) by mouth daily. 90 tablet 3   oxyCODONE  (ROXICODONE ) 5 MG immediate release tablet Take 1 tablet (5 mg total) by mouth every 4 (four) hours as needed for severe pain (pain score 7-10) or breakthrough pain. 10 tablet 0   cetirizine  (ZYRTEC  ALLERGY) 10 MG tablet Take 1 tablet (10 mg total) by mouth at bedtime. 90 tablet 1   EPINEPHrine  0.3 mg/0.3 mL IJ SOAJ injection Inject 0.3 mg into the muscle as needed for anaphylaxis. 1 each 1    famotidine  (PEPCID ) 20 MG tablet Take 1 tablet (20 mg total) by mouth 2 (two) times daily. 60 tablet 0   fluticasone  (FLONASE ) 50 MCG/ACT nasal spray Place 2 sprays into both nostrils daily. 16 g 0   HYDROcodone -acetaminophen  (NORCO/VICODIN) 5-325 MG tablet Take 1-2 tablets by mouth daily as needed for moderate pain (pain score 4-6). 10 tablet 0   hydrOXYzine  (ATARAX /VISTARIL ) 25 MG tablet TAKE ONE TABLET BY MOUTH EVERY 6 HOURS AS NEEDED FOR ITCHING 30 tablet 1   ipratropium (ATROVENT ) 0.06 % nasal spray Place 2 sprays into both nostrils 3 (three) times daily. As needed for nasal congestion, runny nose 15 mL 1   Lasmiditan  Succinate (REYVOW ) 100 MG TABS Take 1 tablet (100 mg total) by mouth as needed. 8 tablet 3   meloxicam  (MOBIC ) 15 MG tablet Take 1 tablet (15 mg total) by mouth daily. 30 tablet 0   methylPREDNISolone  (MEDROL  DOSEPAK) 4 MG TBPK tablet Follow instructions on package 1 each 0   omalizumab (XOLAIR) 150 MG/ML prefilled syringe Inject into the skin every 14 (fourteen) days.       propranolol  (INDERAL ) 20 MG tablet Take 1 tablet (20 mg total) by mouth 2 (two) times daily. 180 tablet 1   scopolamine  (TRANSDERM-SCOP) 1 MG/3DAYS Place 1 patch (1.5 mg total) onto the skin every 3 (three) days. 3 patch 0   tiZANidine  (ZANAFLEX ) 4 MG tablet Take 2 tablets (8 mg total) by mouth at bedtime as needed for muscle spasms. 60 tablet 0   topiramate  (TOPAMAX ) 200 MG tablet Take 1 tablet (200 mg total) by mouth daily. 90 tablet 1   Ubrogepant  (UBRELVY ) 100 MG TABS Take 1 tablet (100 mg total) by mouth as needed (migraine). 48 tablet 1      No current facility-administered medications for this visit.      Imaging Results (Last 48 hours)  No results found.     Review of Systems:   A ROS was performed including pertinent positives and negatives as documented in the HPI.   Physical Exam :   Constitutional: NAD and appears stated age Neurological: Alert and oriented Psych: Appropriate affect and  cooperative There were no vitals taken for this  visit.    Comprehensive Musculoskeletal Exam:     Tenderness about femoral acetabular joint pain with 30 degrees internal rotation positive FADIR.  Negative FABER she does have full knee extension and flexion.  Distal neurosensory exam is intact from sensation perspective     Imaging:   Xray (lumbar spine 4 views): Normal   X-ray 3 views left hip: Normal   MRI left hip: Anterior superior labral tear   I personally reviewed and interpreted the radiographs.     Assessment and Plan:   38 y.o. female with today with evidence of left hip pain consistent with hip labral tearing and instability.  She did get 1 week of extremely good relief from her injection.  I did discuss that her MRI does confirm an anterior superior labral tear.  Given this we did discuss the possibility of hip arthroscopy with root repair.  I discussed the risks and limitations.  I did discuss the associated rehab and recovery.  After discussion she would like to proceed with this   -Plan for left hip arthroscopy with labral repair     After a lengthy discussion of treatment options, including risks, benefits, alternatives, complications of surgical and nonsurgical conservative options, the patient elected surgical repair.    The patient  is aware of the material risks  and complications including, but not limited to injury to adjacent structures, neurovascular injury, infection, numbness, bleeding, implant failure, thermal burns, stiffness, persistent pain, failure to heal, disease transmission from allograft, need for further surgery, dislocation, anesthetic risks, blood clots, risks of death,and others. The probabilities of surgical success and failure discussed with patient given their particular co-morbidities.The time and nature of expected rehabilitation and recovery was discussed.The patient's questions were all answered preoperatively.  No barriers to understanding  were noted. I explained the natural history of the disease process and Rx rationale.  I explained to the patient what I considered to be reasonable expectations given their personal situation.  The final treatment plan was arrived at through a shared patient decision making process model.     I personally saw and evaluated the patient, and participated in the management and treatment plan.   Elspeth Parker, MD Attending Physician, Orthopedic Surgery   This document was dictated using Dragon voice recognition software. A reasonable attempt at proof reading has been made to minimize errors.

## 2023-10-25 NOTE — Anesthesia Preprocedure Evaluation (Signed)
 Anesthesia Evaluation  Patient identified by MRN, date of birth, ID band Patient awake    Reviewed: Allergy & Precautions, NPO status , Patient's Chart, lab work & pertinent test results, reviewed documented beta blocker date and time   History of Anesthesia Complications Negative for: history of anesthetic complications  Airway Mallampati: II  TM Distance: >3 FB     Dental  (+) Poor Dentition   Pulmonary asthma , sleep apnea and Continuous Positive Airway Pressure Ventilation    breath sounds clear to auscultation       Cardiovascular  Rhythm:Regular Rate:Normal     Neuro/Psych  Headaches, Seizures -, Well Controlled,  PSYCHIATRIC DISORDERS Anxiety      Neuromuscular disease    GI/Hepatic ,GERD  ,,(+) neg Cirrhosis        Endo/Other    Renal/GU Renal disease     Musculoskeletal   Abdominal   Peds  Hematology   Anesthesia Other Findings   Reproductive/Obstetrics                              Anesthesia Physical Anesthesia Plan  ASA: 2  Anesthesia Plan: General   Post-op Pain Management:    Induction: Intravenous  PONV Risk Score and Plan: 2 and Ondansetron  and Dexamethasone   Airway Management Planned: Oral ETT  Additional Equipment:   Intra-op Plan:   Post-operative Plan:   Informed Consent: I have reviewed the patients History and Physical, chart, labs and discussed the procedure including the risks, benefits and alternatives for the proposed anesthesia with the patient or authorized representative who has indicated his/her understanding and acceptance.     Dental advisory given  Plan Discussed with: CRNA  Anesthesia Plan Comments:          Anesthesia Quick Evaluation

## 2023-10-25 NOTE — Anesthesia Procedure Notes (Signed)
 Procedure Name: Intubation Date/Time: 10/25/2023 1:22 PM  Performed by: Denton Niels CROME, CRNAPre-anesthesia Checklist: Patient identified, Emergency Drugs available, Suction available and Patient being monitored Patient Re-evaluated:Patient Re-evaluated prior to induction Oxygen Delivery Method: Circle system utilized Preoxygenation: Pre-oxygenation with 100% oxygen Induction Type: IV induction Ventilation: Mask ventilation without difficulty Laryngoscope Size: Mac and 3 Grade View: Grade II Tube type: Oral Tube size: 7.0 mm Number of attempts: 1 Airway Equipment and Method: Stylet Placement Confirmation: ETT inserted through vocal cords under direct vision, positive ETCO2 and breath sounds checked- equal and bilateral Tube secured with: Tape Dental Injury: Teeth and Oropharynx as per pre-operative assessment

## 2023-10-25 NOTE — Telephone Encounter (Signed)
 LOV 12/01/22 NOV none LRF 07/05/23 60 x 0

## 2023-10-25 NOTE — Op Note (Signed)
 Date of Surgery: 10/25/2023  INDICATIONS: Sharon King is a 38 y.o.-year-old female with left hip labral tear.  The risk and benefits of the procedure were discussed in detail and documented in the pre-operative evaluation.   PREOPERATIVE DIAGNOSIS: 1. Left hip labral tear  POSTOPERATIVE DIAGNOSIS: Same.  PROCEDURE: 1. Left hip labral repair   SURGEON: Elspeth LITTIE Parker MD  ASSISTANT: Conley Dawson, ATC  ANESTHESIA:  general  IV FLUIDS AND URINE: See anesthesia record.  ANTIBIOTICS: Ancef   ESTIMATED BLOOD LOSS: 5 mL.  IMPLANTS:  Implant Name Type Inv. Item Serial No. Manufacturer Lot No. LRB No. Used Action  ANCHOR SUT 1.4 FLEX - [id number] Anchor ANCHOR SUT 1.4 FLEX  [employer] [id number] Left 2 Implanted    DRAINS: None  CULTURES: None  COMPLICATIONS: none  DESCRIPTION OF PROCEDURE:  Cartilage Intact femoral and acetabular cartilage   Labrum Torn/frayed/normoplastic appearing   Boundaries of labral tear Convention (3 o'clock anterior, 9 o'clock posterior) Anterior boundary: 3 o'clock Posterior boundary: 1 o'clock   OPERATIVE REPORT:  The patient was brought to the operating room, placed supine on the operating table, and bony prominences were padded.  The traction boots were applied with padding to ensure that safe traction could be applied through the feet.  The contralateral limb was abducted maximally and light traction was applied.  The operative leg was brought into neutral position.  The flouroscopic c-arm was brought between the legs for an AP image.  The patient was prepped and draped in a sterile fashion.  Time-out was performed and landmarks were identified. Traction was obtained and care was taken to ensure the least amount of force necessary to allow safe access to the joint of 8-27mm.  This was checked with fluoroscopy.    Next we placed an anterolateral portal under the assistance of fluoroscopy.  First, fluoroscopy was used to estimate the trajectory  and starting point.  A 5mm incision with a #11 blade was made and a straight hemostat was used to dilate the portal through the appropriate tract.  We then placed a 14-gauge hypodermic needle with careful technique to be as close to the femoral head as possible and parallel to the sorcele to ensure no iatrogenic damage to the labrum.  This released the negative pressure environment and the amount of traction was adjusted to maintain the 8-50mm of distraction.  A nitinol wire was placed through the needle and flouroscopy was used to ensure it extended to the medial wall of the acetabulum.  The Flowport from [employer] was placed over the wire and the nitinol wire was retracted to just inside the capsule during insertion of the dilator and cannula to minimize the risk of breakage. The arthroscope was placed next and we visualized the anterior triangle.     We then placed the anterior portal under direct visualization using the technique described above.  This was safely placed as well without damage to the labrum or femoral head.  We then switched our arthroscope to the anterior portal to ensure we were not through the labrum - we were safely through the capsule only.  We then proceeded with periportal capsulotomies utilizing the Samurai blade in each portal without connecting the two.  We identified the anterior inferior iliac spine proximally, the psoas tendon medially and the rectus tendon laterally as landmarks.  We then proceeded with a diagnostic arthroscopy - the results can be found in the findings section above.    We then used the radiofrequency  device to clear the superior acetabulum and expose the subspinous region.  Next we exposed the acetabular rim leaving the chondral labral junction intact.  When adequate reshaping was obtained we then proceeded with the labral repair. We placed 2 anchors at the 1:00 and 2:30 positions. The sutures were passed using the crescent Nanopass from  [employer].  This resulted in anatomic labral repair.  We debrided the loose cartilage at the rim and residual degenerative labral tissue.  Traction was let down with total traction time of 30 minutes.     Finally, we performed a complete capsular closure with tape suture.  She was replaced in the anterior and posterior limb of the reported capsulotomy with excellent apposition. We then removed the arthroscope and closed the incisions with 3-0 nylon simple stitches.  A sterile dressing was applied..  The patient was awakened from anesthesia and transferred to PACU in stable condition. Postoperative care includes:       POSTOPERATIVE PLAN:    Weight bearing as tolerated operative extremity Formal physical therapy will begin immediately within the first weeks of surgery ASA 325 Daily for DVT prophylaxis      Elspeth LITTIE Parker, MD 2:34 PM

## 2023-10-25 NOTE — Anesthesia Postprocedure Evaluation (Signed)
 Anesthesia Post Note  Patient: Sharon King  Procedure(s) Performed: ARTHROSCOPY, HIP, WITH LABRUM REPAIR (Left: Hip)     Patient location during evaluation: PACU Anesthesia Type: General Level of consciousness: awake and alert Pain management: pain level controlled Vital Signs Assessment: post-procedure vital signs reviewed and stable Respiratory status: spontaneous breathing, nonlabored ventilation, respiratory function stable and patient connected to nasal cannula oxygen Cardiovascular status: blood pressure returned to baseline and stable Postop Assessment: no apparent nausea or vomiting Anesthetic complications: no   No notable events documented.  Last Vitals:  Vitals:   10/25/23 1445 10/25/23 1500  BP: 118/75 118/62  Pulse: 71 74  Resp: 12 19  Temp:    SpO2: 100% 100%    Last Pain:  Vitals:   10/25/23 1500  TempSrc:   PainSc: 3     LLE Motor Response: Purposeful movement (10/25/23 1500) LLE Sensation: Full sensation;Pain (10/25/23 1500)          Lynwood MARLA Cornea

## 2023-10-27 MED ORDER — TIZANIDINE HCL 4 MG PO TABS: 8.0000 mg | ORAL_TABLET | Freq: Every evening | ORAL | 0 refills | Status: DC | PRN

## 2023-10-29 ENCOUNTER — Ambulatory Visit (HOSPITAL_BASED_OUTPATIENT_CLINIC_OR_DEPARTMENT_OTHER): Attending: Orthopaedic Surgery | Admitting: Physical Therapy

## 2023-10-29 ENCOUNTER — Other Ambulatory Visit: Payer: Self-pay

## 2023-10-29 ENCOUNTER — Encounter (HOSPITAL_BASED_OUTPATIENT_CLINIC_OR_DEPARTMENT_OTHER): Payer: Self-pay | Admitting: Physical Therapy

## 2023-10-29 DIAGNOSIS — M25552 Pain in left hip: Secondary | ICD-10-CM | POA: Insufficient documentation

## 2023-10-29 DIAGNOSIS — R262 Difficulty in walking, not elsewhere classified: Secondary | ICD-10-CM | POA: Diagnosis present

## 2023-10-29 DIAGNOSIS — M6281 Muscle weakness (generalized): Secondary | ICD-10-CM | POA: Diagnosis present

## 2023-10-29 NOTE — Therapy (Addendum)
 OUTPATIENT PHYSICAL THERAPY EVALUATION   Patient Name: Sharon King MRN: [id number] DOB:1985-04-26, 38 y.o., female Today's Date: 10/29/2023  END OF SESSION:  PT End of Session - 10/29/23 1114     Visit Number 1    Number of Visits 18    Date for PT Re-Evaluation 01/27/24    Authorization Type [employer]    PT Start Time 0930    PT Stop Time 1010    PT Time Calculation (min) 40 min    Activity Tolerance Patient tolerated treatment well    Behavior During Therapy [facility] for tasks assessed/performed           Past Medical History:  Diagnosis Date   Acute upper GI bleed 08/02/2019   Allergy    Anxiety    Asthma    Cancer (HCC)    GERD (gastroesophageal reflux disease)    Migraines    Neuromuscular disorder (HCC)    Seizures (HCC)    Last seizure 2014   Sleep apnea    pt denies   Trigeminal neuralgia    Uterine fibroid    Past Surgical History:  Procedure Laterality Date   BREAST ENHANCEMENT SURGERY  2008   BREAST SURGERY     CESAREAN SECTION N/A 06/13/2015   Procedure: CESAREAN SECTION;  Surgeon: Elveria Mungo, MD;  Location: [facility];  Service: Obstetrics;  Laterality: N/A;   COSMETIC SURGERY     ESOPHAGOGASTRODUODENOSCOPY (EGD) WITH PROPOFOL  N/A 08/30/2019   Procedure: ESOPHAGOGASTRODUODENOSCOPY (EGD) WITH PROPOFOL ;  Surgeon: Janalyn Keene NOVAK, MD;  Location: [facility];  Service: Endoscopy;  Laterality: N/A;   wisdom teeth     Patient Active Problem List   Diagnosis Date Noted   Tear of left acetabular labrum 10/25/2023   Right ovarian cyst 09/30/2023   Adjustment disorder with mixed anxiety and depressed mood 01/20/2022   Urticaria 12/22/2019   Chronic migraine without aura 06/23/2019   Muscle spasm 02/18/2016   Allergic rhinitis 04/22/2007   Asthma 11/06/2006   Migraines 11/06/2006     REFERRING PROVIDER:  Genelle Standing, MD    REFERRING DIAG:  [id number] (ICD-10-CM) - Pain in left hip  Will be post op labral  repair hip      Rationale for Evaluation and Treatment: Rehabilitation  THERAPY DIAG:  Pain in left hip - Plan: PT plan of care cert/re-cert  Difficulty in walking, not elsewhere classified - Plan: PT plan of care cert/re-cert  Muscle weakness (generalized) - Plan: PT plan of care cert/re-cert  ONSET DATE: Days since surgery: 4   **INSURANCE DENIED AQUATICS**  SUBJECTIVE:  SUBJECTIVE STATEMENT: Pt is post op L hip labral repair. Pt still has pain at 5-6/10 from being up and moving quite a bit. Transferring very difficult with getting in and out of bed. Pt has 37 year old at home. Has in laws help. Fiance is EMT. Pt has not been able to shower. Pt has not changed surgical bandages. Taking aspirin  as prescribed. No signs of DVT or infection  PERTINENT HISTORY:  See pmh  PAIN:  Are you having pain? Yes: NPRS scale: 6/10 Pain location: L hip Pain description: aching surgical pain   PRECAUTIONS:  None  RED FLAGS: None   WEIGHT BEARING RESTRICTIONS:  WBAT  FALLS:  Has patient fallen in last 6 months? No  OCCUPATION:  Not currently working   PLOF:  Independent  PATIENT GOALS:  Decrease pain, return to normal walking and activity   Home: 3 steps to enter with a rail Equipment: crutches, no shower chair at home    OBJECTIVE:  Note: Objective measures were completed at Evaluation unless otherwise noted.  DIAGNOSTIC FINDINGS:  FINDINGS:   Bones and labrum: There is no fracture or accelerated arthrosis. Full-thickness cartilage defect, subchondral reactive edema or joint effusion is present. No displaced labral tear is identified. Pelvic ring, sacrum and SI joints are unremarkable.   Musculotendinous structures: Gluteus medius and minimus tendons are unremarkable. Hamstring  tendon is unremarkable. There is no greater trochanteric bursal collection. No significant tendinosis or myositis is present.   IMPRESSION: No accelerated arthrosis or displaced labral tear is identified.   No significant tendinosis or myositis is present.   There is a likely functional ovarian cyst right adnexa measuring approximately 4.4 cm in size. Follow-up ultrasound suggested and 6-12 weeks to ensure resolution.   Electronically signed by: Norleen Satchel MD 09/27/2023 11:09 AM EDT RP Workstation: [id number]   Extreme difficulty/unable (0), Quite a bit of difficulty (1), Moderate difficulty (2), Little difficulty (3), No difficulty (4) Survey date:  eval  Any of your usual work, housework or school activities 2  2. Usual hobbies, recreational or sporting activities 2  3. Getting into/out of the bath 3  4. Walking between rooms 2  5. Putting on socks/shoes 1  6. Squatting  1  7. Lifting an object, like a bag of groceries from the floor 1  8. Performing light activities around your home 2  9. Performing heavy activities around your home 0  10. Getting into/out of a car 1  11. Walking 2 blocks 1  12. Walking 1 mile 1  13. Going up/down 10 stairs (1 flight) 1  14. Standing for 1 hour 1  15.  sitting for 1 hour 1  16. Running on even ground 0  17. Running on uneven ground 0  18. Making sharp turns while running fast 0  19. Hopping  0  20. Rolling over in bed 1  Score total:  21/80     COGNITION:           Overall cognitive status: Within functional limits for tasks assessed                          SENSATION: Light touch: Impaired      POSTURE: weight shift right in seated, R trunk lean   PALPATION:  TTP along L greater trochanter;  mild edema noted into greater trochanteric region; surgical bandage in place no signs of drainage or erythema, bruising around surgical sites   LOWER EXTREMITY  ROM:  PROM ROM Right eval Left eval  Hip flexion 120 90 p!  Hip  extension 10 0  Hip abduction 30 10   Hip adduction      Hip internal rotation 35 5 p!  Hip external rotation 40 5 p!   (Blank rows = not tested)   LOWER EXTREMITY MMT: not indicated at this time     GAIT: Distance walked: 67ft Assistive device utilized: None Level of assistance: modified Independence Comments: WBAT, bilat crutches      TODAY'S TREATMENT:    Bandage change and wound inspection  Exercises - Supine Posterior Pelvic Tilt  - 2 x daily - 7 x weekly - 2 sets - 10 reps - 2 hold - Hooklying Gluteal Sets  - 2 x daily - 7 x weekly - 2 sets - 10 reps - Lying Prone  - 2-3 x daily - 7 x weekly - 1 sets - 1 reps - 5-10 min hold - Ankle Pumps in Elevation  - 6-8 x daily - 7 x weekly - 1 sets - 20 reps   PATIENT EDUCATION:  Education details: surgical precautions, thermotherapy, diagnosis, prognosis, anatomy, exercise progression, DOMS expectations, muscle firing,  envelope of function, HEP, POC   Person educated: Patient Education method: Explanation, Demonstration, Tactile cues, Verbal cues, and Handouts Education comprehension: verbalized understanding, returned demonstration, verbal cues required, and tactile cues required     HOME EXERCISE PROGRAM:  Access Code: [email or web address] URL: [email or web address] Date: 10/29/2023 Prepared by: Dale Call   ASSESSMENT:   CLINICAL IMPRESSION: Patient is a 38 y.o. female who was seen today for physical therapy evaluation and treatment for s/p L hip labral repair. Pt with expected ROM, strength, and gait deficits at this time. Pt's pain is moderately controlled at this time. Pt with pain with A/PROM. Precautions and protocol provided to both pt and significant other. Plan to continue with PROM and protocol progression at next. Pt would benefit from continued skilled therapy in order to reach goals and maximize functional L LE strength and ROM for prevention of further functional decline.       OBJECTIVE IMPAIRMENTS  Abnormal gait, decreased activity tolerance, decreased balance, decreased endurance, decreased mobility, difficulty walking, decreased ROM, decreased strength, hypomobility, increased fascial restrictions, increased muscle spasms, impaired flexibility, improper body mechanics, postural dysfunction, and pain.    ACTIVITY LIMITATIONS carrying, lifting, bending, sitting, standing, squatting, stairs, transfers, and locomotion level   PARTICIPATION LIMITATIONS: cleaning, laundry, interpersonal relationship, driving, shopping, community activity, occupation, and exercise   PERSONAL FACTORS Age, Behavior pattern, Fitness, Past/current experiences, Time since onset of injury/illness/exacerbation, and 1-2 comorbidities:   are also affecting patient's functional outcome.    REHAB POTENTIAL: good      CLINICAL DECISION MAKING: Stable/uncomplicated   EVALUATION COMPLEXITY: Low     GOALS:     SHORT TERM GOALS: Target date: 12/10/2023     Pt will become independent with HEP in order to demonstrate synthesis of PT education.    Goal status: INITIAL   2.  Pt will report at least 2 pt reduction on NPRS scale for pain in order to demonstrate functional improvement with household activity, self care, and ADL.    Goal status: INITIAL   3.  Pt will be able to demonstrate ability to descend stairs with reciprocal pattern and single UE in order to demonstrate functional improvement in LE function for self-care and house hold duties.    Goal status: INITIAL   4.  Pt will have an  at least 9 pt improvement in LEFS measure in order to demonstrate MCID improvement in daily function.  Baseline:  Goal status: INITIAL       LONG TERM GOALS:  01/27/2024    Pt  will become independent with final HEP in order to demonstrate synthesis of PT education. Goal status: INITIAL   2. Pt will have an at least 27 pt improvement in LEFS measure in order to demonstrate MCID improvement in daily function.     Goal  status: INITIAL   3.  Pt will be able to demonstrate  full depth DL squat in order to demonstrate functional improvement in L LE strength for return to PLOF and exercise.     Goal status: INITIAL   4.  Pt will be able to demonstrate/report ability to walk >30 mins without pain or need for assistance in order to demonstrate functional improvement and tolerance to exercise and community mobility.   Goal status: INITIAL     PLAN: PT FREQUENCY: 1-2x/week   PT DURATION: 12 wks   PLANNED INTERVENTIONS: Therapeutic exercises, Therapeutic activity, Neuromuscular re-education, Balance training, Gait training, Patient/Family education, Self Care, Joint mobilization, Joint manipulation, Stair training, Orthotic/Fit training, DME instructions, Aquatic Therapy, Dry Needling, Electrical stimulation, Spinal manipulation, Spinal mobilization, Cryotherapy, Moist heat, scar mobilization, Splintting, Taping, Vasopneumatic device, Traction, Ultrasound, Ionotophoresis 4mg /ml Dexamethasone , Manual therapy, and Re-evaluation   PLAN FOR NEXT SESSION: PROM per protocol, gait training    Dale Call PT, DPT 10/29/23 11:32 AM    For all possible CPT codes, reference the Planned Interventions line above.     Check all conditions that are expected to impact treatment: {Conditions expected to impact treatment:Musculoskeletal disorders   If treatment provided at initial evaluation, no treatment charged due to lack of authorization.

## 2023-11-02 ENCOUNTER — Encounter (HOSPITAL_BASED_OUTPATIENT_CLINIC_OR_DEPARTMENT_OTHER): Payer: Self-pay | Admitting: Orthopaedic Surgery

## 2023-11-04 ENCOUNTER — Ambulatory Visit (HOSPITAL_BASED_OUTPATIENT_CLINIC_OR_DEPARTMENT_OTHER): Admitting: Physical Therapy

## 2023-11-04 ENCOUNTER — Encounter (HOSPITAL_BASED_OUTPATIENT_CLINIC_OR_DEPARTMENT_OTHER): Payer: Self-pay | Admitting: Physical Therapy

## 2023-11-04 DIAGNOSIS — R262 Difficulty in walking, not elsewhere classified: Secondary | ICD-10-CM

## 2023-11-04 DIAGNOSIS — M25552 Pain in left hip: Secondary | ICD-10-CM | POA: Diagnosis not present

## 2023-11-04 DIAGNOSIS — M6281 Muscle weakness (generalized): Secondary | ICD-10-CM

## 2023-11-04 NOTE — Therapy (Signed)
 OUTPATIENT PHYSICAL THERAPY EVALUATION   Patient Name: Sharon King MRN: [id number] DOB:18-Oct-1985, 38 y.o., female Today's Date: 11/04/2023  END OF SESSION:  PT End of Session - 11/04/23 1304     Visit Number 2    Number of Visits 18    Date for PT Re-Evaluation 01/27/24    Authorization Type [employer]    Authorization Time Period 7.18-8.30.25    Authorization - Visit Number 1    Authorization - Number of Visits 15    PT Start Time 1304    PT Stop Time 1332    PT Time Calculation (min) 28 min    Activity Tolerance Patient tolerated treatment well    Behavior During Therapy [facility] for tasks assessed/performed           Past Medical History:  Diagnosis Date   Acute upper GI bleed 08/02/2019   Allergy    Anxiety    Asthma    Cancer (HCC)    GERD (gastroesophageal reflux disease)    Migraines    Neuromuscular disorder (HCC)    Seizures (HCC)    Last seizure 2014   Sleep apnea    pt denies   Trigeminal neuralgia    Uterine fibroid    Past Surgical History:  Procedure Laterality Date   BREAST ENHANCEMENT SURGERY  2008   BREAST SURGERY     CESAREAN SECTION N/A 06/13/2015   Procedure: CESAREAN SECTION;  Surgeon: Elveria Mungo, MD;  Location: [facility];  Service: Obstetrics;  Laterality: N/A;   COSMETIC SURGERY     ESOPHAGOGASTRODUODENOSCOPY (EGD) WITH PROPOFOL  N/A 08/30/2019   Procedure: ESOPHAGOGASTRODUODENOSCOPY (EGD) WITH PROPOFOL ;  Surgeon: Janalyn Keene NOVAK, MD;  Location: [facility];  Service: Endoscopy;  Laterality: N/A;   wisdom teeth     Patient Active Problem List   Diagnosis Date Noted   Tear of left acetabular labrum 10/25/2023   Right ovarian cyst 09/30/2023   Adjustment disorder with mixed anxiety and depressed mood 01/20/2022   Urticaria 12/22/2019   Chronic migraine without aura 06/23/2019   Muscle spasm 02/18/2016   Allergic rhinitis 04/22/2007   Asthma 11/06/2006   Migraines 11/06/2006     REFERRING  PROVIDER:  Genelle Standing, MD    REFERRING DIAG:  [phone] (ICD-10-CM) - Pain in left hip  Will be post op labral repair hip      Rationale for Evaluation and Treatment: Rehabilitation  THERAPY DIAG:  Pain in left hip  Difficulty in walking, not elsewhere classified  Muscle weakness (generalized)  ONSET DATE: Days since surgery: 10   **INSURANCE DENIED AQUATICS**  SUBJECTIVE:  SUBJECTIVE STATEMENT: Has had a couple of twinges. Sleeping is the most difficult. Has been compliant with aspirin   PERTINENT HISTORY:  See pmh  PAIN:  Are you having pain? Yes: NPRS scale: 6/10 Pain location: L hip Pain description: aching surgical pain   PRECAUTIONS:  None  RED FLAGS: None   WEIGHT BEARING RESTRICTIONS:  WBAT  FALLS:  Has patient fallen in last 6 months? No  OCCUPATION:  Not currently working   PLOF:  Independent  PATIENT GOALS:  Decrease pain, return to normal walking and activity   Home: 3 steps to enter with a rail Equipment: crutches, no shower chair at home    OBJECTIVE:  Note: Objective measures were completed at Evaluation unless otherwise noted.  DIAGNOSTIC FINDINGS:  FINDINGS:   Bones and labrum: There is no fracture or accelerated arthrosis. Full-thickness cartilage defect, subchondral reactive edema or joint effusion is present. No displaced labral tear is identified. Pelvic ring, sacrum and SI joints are unremarkable.   Musculotendinous structures: Gluteus medius and minimus tendons are unremarkable. Hamstring tendon is unremarkable. There is no greater trochanteric bursal collection. No significant tendinosis or myositis is present.   IMPRESSION: No accelerated arthrosis or displaced labral tear is identified.   No significant tendinosis or myositis  is present.   There is a likely functional ovarian cyst right adnexa measuring approximately 4.4 cm in size. Follow-up ultrasound suggested and 6-12 weeks to ensure resolution.   Electronically signed by: Norleen Satchel MD 09/27/2023 11:09 AM EDT RP Workstation: [id number]   Extreme difficulty/unable (0), Quite a bit of difficulty (1), Moderate difficulty (2), Little difficulty (3), No difficulty (4) Survey date:  eval  Any of your usual work, housework or school activities 2  2. Usual hobbies, recreational or sporting activities 2  3. Getting into/out of the bath 3  4. Walking between rooms 2  5. Putting on socks/shoes 1  6. Squatting  1  7. Lifting an object, like a bag of groceries from the floor 1  8. Performing light activities around your home 2  9. Performing heavy activities around your home 0  10. Getting into/out of a car 1  11. Walking 2 blocks 1  12. Walking 1 mile 1  13. Going up/down 10 stairs (1 flight) 1  14. Standing for 1 hour 1  15.  sitting for 1 hour 1  16. Running on even ground 0  17. Running on uneven ground 0  18. Making sharp turns while running fast 0  19. Hopping  0  20. Rolling over in bed 1  Score total:  21/80     COGNITION:           Overall cognitive status: Within functional limits for tasks assessed                          SENSATION: Light touch: Impaired      POSTURE: weight shift right in seated, R trunk lean   PALPATION:  TTP along L greater trochanter;  mild edema noted into greater trochanteric region; surgical bandage in place no signs of drainage or erythema, bruising around surgical sites   LOWER EXTREMITY ROM:  PROM ROM Right eval Left eval  Hip flexion 120 90 p!  Hip extension 10 0  Hip abduction 30 10   Hip adduction      Hip internal rotation 35 5 p!  Hip external rotation 40 5 p!   (Blank rows = not tested)  LOWER EXTREMITY MMT: not indicated at this time     GAIT: Distance walked: 88ft Assistive device  utilized: None Level of assistance: modified Independence Comments: WBAT, bilat crutches      TODAY'S TREATMENT:   Treatment                            7/24: Blank lines following charge title = not provided on this treatment date.   Manual:  TPDN No STM hip flexors Hip PROM, gentle distraction There-ex: Isometric HS curl over swiss ball + ab set HS curl + mini bridge, ball bw knees Swiss ball roll in/out with small ball bw knees There-Act:  Self Care:  Nuro-Re-ed:  Gait Training: Without crutch    PATIENT EDUCATION:  Education details: Teacher, music of condition, POC, HEP, exercise form/rationale    Person educated: Patient Education method: Explanation, Demonstration, Tactile cues, Verbal cues, and Handouts Education comprehension: verbalized understanding, returned demonstration, verbal cues required, and tactile cues required     HOME EXERCISE PROGRAM:  Access Code: [email or web address] URL: [email or web address] Date: 10/29/2023 Prepared by: Dale Call   ASSESSMENT:   CLINICAL IMPRESSION: Pt is progressing very well with minimal discomfort. Advised to walk slowly to avoid limping due to lack of hip ext that is expected at this time. Bilateral pes planus noted as she was in sandals today. Reports her foot got caught on a kitchen mat at home but denies any fall/stumble that could disrupt her hip.      OBJECTIVE IMPAIRMENTS Abnormal gait, decreased activity tolerance, decreased balance, decreased endurance, decreased mobility, difficulty walking, decreased ROM, decreased strength, hypomobility, increased fascial restrictions, increased muscle spasms, impaired flexibility, improper body mechanics, postural dysfunction, and pain.    ACTIVITY LIMITATIONS carrying, lifting, bending, sitting, standing, squatting, stairs, transfers, and locomotion level   PARTICIPATION LIMITATIONS: cleaning, laundry, interpersonal relationship, driving, shopping, community activity,  occupation, and exercise   PERSONAL FACTORS Age, Behavior pattern, Fitness, Past/current experiences, Time since onset of injury/illness/exacerbation, and 1-2 comorbidities:   are also affecting patient's functional outcome.    REHAB POTENTIAL: good      CLINICAL DECISION MAKING: Stable/uncomplicated   EVALUATION COMPLEXITY: Low     GOALS:     SHORT TERM GOALS: Target date: 12/10/2023     Pt will become independent with HEP in order to demonstrate synthesis of PT education.    Goal status: INITIAL   2.  Pt will report at least 2 pt reduction on NPRS scale for pain in order to demonstrate functional improvement with household activity, self care, and ADL.    Goal status: INITIAL   3.  Pt will be able to demonstrate ability to descend stairs with reciprocal pattern and single UE in order to demonstrate functional improvement in LE function for self-care and house hold duties.    Goal status: INITIAL   4.  Pt will have an at least 9 pt improvement in LEFS measure in order to demonstrate MCID improvement in daily function.  Baseline:  Goal status: INITIAL       LONG TERM GOALS:  01/27/2024    Pt  will become independent with final HEP in order to demonstrate synthesis of PT education. Goal status: INITIAL   2. Pt will have an at least 27 pt improvement in LEFS measure in order to demonstrate MCID improvement in daily function.     Goal status: INITIAL   3.  Pt will be able to demonstrate  full depth  DL squat in order to demonstrate functional improvement in L LE strength for return to PLOF and exercise.     Goal status: INITIAL   4.  Pt will be able to demonstrate/report ability to walk >30 mins without pain or need for assistance in order to demonstrate functional improvement and tolerance to exercise and community mobility.   Goal status: INITIAL     PLAN: PT FREQUENCY: 1-2x/week   PT DURATION: 12 wks   PLANNED INTERVENTIONS: Therapeutic exercises,  Therapeutic activity, Neuromuscular re-education, Balance training, Gait training, Patient/Family education, Self Care, Joint mobilization, Joint manipulation, Stair training, Orthotic/Fit training, DME instructions, Aquatic Therapy, Dry Needling, Electrical stimulation, Spinal manipulation, Spinal mobilization, Cryotherapy, Moist heat, scar mobilization, Splintting, Taping, Vasopneumatic device, Traction, Ultrasound, Ionotophoresis 4mg /ml Dexamethasone , Manual therapy, and Re-evaluation   PLAN FOR NEXT SESSION: PROM per protocol, gait training   Kahleb Mcclane C. Antanette Richwine PT, DPT 11/04/23 1:37 PM     For all possible CPT codes, reference the Planned Interventions line above.     Check all conditions that are expected to impact treatment: {Conditions expected to impact treatment:Musculoskeletal disorders   If treatment provided at initial evaluation, no treatment charged due to lack of authorization.

## 2023-11-08 ENCOUNTER — Ambulatory Visit (HOSPITAL_BASED_OUTPATIENT_CLINIC_OR_DEPARTMENT_OTHER): Admitting: Orthopaedic Surgery

## 2023-11-08 DIAGNOSIS — M25552 Pain in left hip: Secondary | ICD-10-CM

## 2023-11-08 NOTE — Progress Notes (Signed)
 Post Operative Evaluation    Procedure/Date of Surgery: Left hip arthroscopy labral repair 7/14  Interval History:   Presents 2-week status post above procedure.  Overall she is doing extremely well.  She has been progressing with range of motion.  She is working with physical therapy   PMH/PSH/Family History/Social History/Meds/Allergies:    Past Medical History:  Diagnosis Date   Acute upper GI bleed 08/02/2019   Allergy    Anxiety    Asthma    Cancer (HCC)    GERD (gastroesophageal reflux disease)    Migraines    Neuromuscular disorder (HCC)    Seizures (HCC)    Last seizure 2014   Sleep apnea    pt denies   Trigeminal neuralgia    Uterine fibroid    Past Surgical History:  Procedure Laterality Date   BREAST ENHANCEMENT SURGERY  2008   BREAST SURGERY     CESAREAN SECTION N/A 06/13/2015   Procedure: CESAREAN SECTION;  Surgeon: Elveria Mungo, MD;  Location: [facility];  Service: Obstetrics;  Laterality: N/A;   COSMETIC SURGERY     ESOPHAGOGASTRODUODENOSCOPY (EGD) WITH PROPOFOL  N/A 08/30/2019   Procedure: ESOPHAGOGASTRODUODENOSCOPY (EGD) WITH PROPOFOL ;  Surgeon: Janalyn Keene NOVAK, MD;  Location: [facility];  Service: Endoscopy;  Laterality: N/A;   wisdom teeth     Social History   Socioeconomic History   Marital status: Significant Other    Spouse name: Not on file   Number of children: Not on file   Years of education: Not on file   Highest education level: Not on file  Occupational History   Not on file  Tobacco Use   Smoking status: Never   Smokeless tobacco: Former  Advertising account planner   Vaping status: Never Used  Substance and Sexual Activity   Alcohol use: Yes    Comment: socially   Drug use: No   Sexual activity: Yes    Partners: Male    Birth control/protection: Pill  Other Topics Concern   Not on file  Social History Narrative   Not on file   Social Drivers of Health   Financial Resource Strain: Not on  file  Food Insecurity: Not on file  Transportation Needs: Not on file  Physical Activity: Not on file  Stress: Not on file  Social Connections: Not on file   Family History  Problem Relation Age of Onset   Hyperlipidemia Mother    Arthritis Mother    Depression Mother    Arthritis Father    Depression Father    Alcohol abuse Father    Allergies  Allergen Reactions   Egg-Derived Products Rash   Erythromycin Rash   Paroxetine Palpitations   Sulfonamide Derivatives Rash   Current Outpatient Medications  Medication Sig Dispense Refill   aspirin  EC 325 MG tablet Take 1 tablet (325 mg total) by mouth daily. 14 tablet 0   cetirizine  (ZYRTEC  ALLERGY) 10 MG tablet Take 1 tablet (10 mg total) by mouth at bedtime. 90 tablet 1   EPINEPHrine  0.3 mg/0.3 mL IJ SOAJ injection Inject 0.3 mg into the muscle as needed for anaphylaxis. 1 each 1   famotidine  (PEPCID ) 20 MG tablet Take 1 tablet (20 mg total) by mouth 2 (two) times daily. 60 tablet 0   fluticasone  (FLONASE ) 50 MCG/ACT nasal spray Place 2 sprays into  both nostrils daily. 16 g 0   HYDROcodone -acetaminophen  (NORCO/VICODIN) 5-325 MG tablet Take 1-2 tablets by mouth daily as needed for moderate pain (pain score 4-6). 10 tablet 0   hydrOXYzine  (ATARAX /VISTARIL ) 25 MG tablet TAKE ONE TABLET BY MOUTH EVERY 6 HOURS AS NEEDED FOR ITCHING 30 tablet 1   Lasmiditan  Succinate (REYVOW ) 100 MG TABS Take 1 tablet (100 mg total) by mouth as needed. (Patient taking differently: Take 100 mg by mouth as needed.) 8 tablet 3   meloxicam  (MOBIC ) 15 MG tablet Take 1 tablet (15 mg total) by mouth daily. 30 tablet 0   norethindrone  (CAMILA ) 0.35 MG tablet Take 1 tablet (0.35 mg total) by mouth daily. 90 tablet 3   omalizumab (XOLAIR) 150 MG/ML prefilled syringe Inject into the skin every 14 (fourteen) days.     oxyCODONE  (ROXICODONE ) 5 MG immediate release tablet Take 1 tablet (5 mg total) by mouth every 4 (four) hours as needed for severe pain (pain score 7-10)  or breakthrough pain. 10 tablet 0   tiZANidine  (ZANAFLEX ) 4 MG tablet Take 2 tablets (8 mg total) by mouth at bedtime as needed for muscle spasms. 60 tablet 0   topiramate  (TOPAMAX ) 200 MG tablet Take 1 tablet (200 mg total) by mouth daily. 90 tablet 1   Ubrogepant  (UBRELVY ) 100 MG TABS Take 1 tablet (100 mg total) by mouth as needed (migraine). 48 tablet 1   No current facility-administered medications for this visit.   No results found.  Review of Systems:   A ROS was performed including pertinent positives and negatives as documented in the HPI.   Musculoskeletal Exam:     Hip incisions are well-appearing without erythema or drainage.  Internal/external rotation to 30 degrees.  Good abduction strength.  Distal neurosensory exam is intact  Imaging:      I personally reviewed and interpreted the radiographs.   Assessment:   2 Weeks status post left hip arthroscopic labral pair doing extremely well.  At this time she will continue to progress through strengthening and range of motion I will see her back in 4 weeks for reassessment  Plan :    - Clinic 4 weeks for reassessment      I personally saw and evaluated the patient, and participated in the management and treatment plan.  Elspeth Parker, MD Attending Physician, Orthopedic Surgery  This document was dictated using Dragon voice recognition software. A reasonable attempt at proof reading has been made to minimize errors.

## 2023-11-11 ENCOUNTER — Ambulatory Visit (HOSPITAL_BASED_OUTPATIENT_CLINIC_OR_DEPARTMENT_OTHER): Admitting: Physical Therapy

## 2023-11-20 ENCOUNTER — Encounter (HOSPITAL_BASED_OUTPATIENT_CLINIC_OR_DEPARTMENT_OTHER): Payer: Self-pay | Admitting: Physical Therapy

## 2023-11-20 ENCOUNTER — Ambulatory Visit (HOSPITAL_BASED_OUTPATIENT_CLINIC_OR_DEPARTMENT_OTHER): Attending: Orthopaedic Surgery | Admitting: Physical Therapy

## 2023-11-20 DIAGNOSIS — M6281 Muscle weakness (generalized): Secondary | ICD-10-CM | POA: Insufficient documentation

## 2023-11-20 DIAGNOSIS — M25552 Pain in left hip: Secondary | ICD-10-CM | POA: Insufficient documentation

## 2023-11-20 DIAGNOSIS — R262 Difficulty in walking, not elsewhere classified: Secondary | ICD-10-CM | POA: Insufficient documentation

## 2023-11-20 NOTE — Therapy (Signed)
 OUTPATIENT PHYSICAL THERAPY EVALUATION   Patient Name: Sharon King MRN: [id number] DOB:1985-06-29, 38 y.o., female Today's Date: 11/20/2023  END OF SESSION:  PT End of Session - 11/20/23 1003     Visit Number 3    Number of Visits 18    Date for PT Re-Evaluation 01/27/24    Authorization Type [employer]    Authorization Time Period 7.18-8.30.25    Authorization - Number of Visits 15    PT Start Time 1002    PT Stop Time 1047    PT Time Calculation (min) 45 min    Activity Tolerance Patient tolerated treatment well    Behavior During Therapy [facility] for tasks assessed/performed           Past Medical History:  Diagnosis Date   Acute upper GI bleed 08/02/2019   Allergy    Anxiety    Asthma    Cancer (HCC)    GERD (gastroesophageal reflux disease)    Migraines    Neuromuscular disorder (HCC)    Seizures (HCC)    Last seizure 2014   Sleep apnea    pt denies   Trigeminal neuralgia    Uterine fibroid    Past Surgical History:  Procedure Laterality Date   BREAST ENHANCEMENT SURGERY  2008   BREAST SURGERY     CESAREAN SECTION N/A 06/13/2015   Procedure: CESAREAN SECTION;  Surgeon: Elveria Mungo, MD;  Location: [facility];  Service: Obstetrics;  Laterality: N/A;   COSMETIC SURGERY     ESOPHAGOGASTRODUODENOSCOPY (EGD) WITH PROPOFOL  N/A 08/30/2019   Procedure: ESOPHAGOGASTRODUODENOSCOPY (EGD) WITH PROPOFOL ;  Surgeon: Janalyn Keene NOVAK, MD;  Location: [facility];  Service: Endoscopy;  Laterality: N/A;   wisdom teeth     Patient Active Problem List   Diagnosis Date Noted   Tear of left acetabular labrum 10/25/2023   Right ovarian cyst 09/30/2023   Adjustment disorder with mixed anxiety and depressed mood 01/20/2022   Urticaria 12/22/2019   Chronic migraine without aura 06/23/2019   Muscle spasm 02/18/2016   Allergic rhinitis 04/22/2007   Asthma 11/06/2006   Migraines 11/06/2006     REFERRING PROVIDER:  Genelle Standing, MD     REFERRING DIAG:  [phone] (ICD-10-CM) - Pain in left hip  Will be post op labral repair hip      Rationale for Evaluation and Treatment: Rehabilitation  THERAPY DIAG:  Pain in left hip  Difficulty in walking, not elsewhere classified  Muscle weakness (generalized)  ONSET DATE: Days since surgery: 26   **INSURANCE DENIED AQUATICS**  SUBJECTIVE:  SUBJECTIVE STATEMENT: I was up and moving aroudn yesterday but I was sitting and felt a sharp/stabbing pain that started in my buttock. Lateral quads feel really tight.   PERTINENT HISTORY:  See pmh  PAIN:  Are you having pain? Yes: NPRS scale: 6/10 Pain location: L hip Pain description: aching surgical pain   PRECAUTIONS:  None  RED FLAGS: None   WEIGHT BEARING RESTRICTIONS:  WBAT  FALLS:  Has patient fallen in last 6 months? No  OCCUPATION:  Not currently working   PLOF:  Independent  PATIENT GOALS:  Decrease pain, return to normal walking and activity   Home: 3 steps to enter with a rail Equipment: crutches, no shower chair at home    OBJECTIVE:  Note: Objective measures were completed at Evaluation unless otherwise noted.  DIAGNOSTIC FINDINGS:  FINDINGS:   Bones and labrum: There is no fracture or accelerated arthrosis. Full-thickness cartilage defect, subchondral reactive edema or joint effusion is present. No displaced labral tear is identified. Pelvic ring, sacrum and SI joints are unremarkable.   Musculotendinous structures: Gluteus medius and minimus tendons are unremarkable. Hamstring tendon is unremarkable. There is no greater trochanteric bursal collection. No significant tendinosis or myositis is present.   IMPRESSION: No accelerated arthrosis or displaced labral tear is identified.   No significant  tendinosis or myositis is present.   There is a likely functional ovarian cyst right adnexa measuring approximately 4.4 cm in size. Follow-up ultrasound suggested and 6-12 weeks to ensure resolution.   Electronically signed by: Norleen Satchel MD 09/27/2023 11:09 AM EDT RP Workstation: [id number]   Extreme difficulty/unable (0), Quite a bit of difficulty (1), Moderate difficulty (2), Little difficulty (3), No difficulty (4) Survey date:  eval  Any of your usual work, housework or school activities 2  2. Usual hobbies, recreational or sporting activities 2  3. Getting into/out of the bath 3  4. Walking between rooms 2  5. Putting on socks/shoes 1  6. Squatting  1  7. Lifting an object, like a bag of groceries from the floor 1  8. Performing light activities around your home 2  9. Performing heavy activities around your home 0  10. Getting into/out of a car 1  11. Walking 2 blocks 1  12. Walking 1 mile 1  13. Going up/down 10 stairs (1 flight) 1  14. Standing for 1 hour 1  15.  sitting for 1 hour 1  16. Running on even ground 0  17. Running on uneven ground 0  18. Making sharp turns while running fast 0  19. Hopping  0  20. Rolling over in bed 1  Score total:  21/80     COGNITION:           Overall cognitive status: Within functional limits for tasks assessed                          SENSATION: Light touch: Impaired      POSTURE: weight shift right in seated, R trunk lean   PALPATION:  TTP along L greater trochanter;  mild edema noted into greater trochanteric region; surgical bandage in place no signs of drainage or erythema, bruising around surgical sites   LOWER EXTREMITY ROM:  PROM ROM Right eval Left eval  Hip flexion 120 90 p!  Hip extension 10 0  Hip abduction 30 10   Hip adduction      Hip internal rotation 35 5 p!  Hip external  rotation 40 5 p!   (Blank rows = not tested)   LOWER EXTREMITY MMT: not indicated at this time     GAIT: Distance walked:  36ft Assistive device utilized: None Level of assistance: modified Independence Comments: WBAT, bilat crutches      TODAY'S TREATMENT:   Treatment                            8/9: Blank lines following charge title = not provided on this treatment date.   Manual:  TPDN No IASTM VL, piriformis, glut med There-ex:  There-Act:  Self Care:  Nuro-Re-ed: Adduction + ab set, added bridge with hamstring focus Standing hip hinge- bar along spine for form Dead lift- double and single leg with toe prop, bar to knees Gait Training:    Treatment                            7/24: Blank lines following charge title = not provided on this treatment date.   Manual:  TPDN No STM hip flexors Hip PROM, gentle distraction There-ex: Isometric HS curl over swiss ball + ab set HS curl + mini bridge, ball bw knees Swiss ball roll in/out with small ball bw knees There-Act:  Self Care:  Nuro-Re-ed:  Gait Training: Without crutch    PATIENT EDUCATION:  Education details: Teacher, music of condition, POC, HEP, exercise form/rationale    Person educated: Patient Education method: Explanation, Demonstration, Tactile cues, Verbal cues, and Handouts Education comprehension: verbalized understanding, returned demonstration, verbal cues required, and tactile cues required     HOME EXERCISE PROGRAM:  Access Code: [email or web address] URL: [email or web address] Date: 10/29/2023 Prepared by: Dale Call   ASSESSMENT:   CLINICAL IMPRESSION: Concordant pain decreased with IASTM to VL and hip. Pt was sick last week and had significant spasm in hip and leg. Cuing required for proper form in hip hinges and dead lifts but denied pain.      OBJECTIVE IMPAIRMENTS Abnormal gait, decreased activity tolerance, decreased balance, decreased endurance, decreased mobility, difficulty walking, decreased ROM, decreased strength, hypomobility, increased fascial restrictions, increased muscle spasms, impaired  flexibility, improper body mechanics, postural dysfunction, and pain.    ACTIVITY LIMITATIONS carrying, lifting, bending, sitting, standing, squatting, stairs, transfers, and locomotion level   PARTICIPATION LIMITATIONS: cleaning, laundry, interpersonal relationship, driving, shopping, community activity, occupation, and exercise   PERSONAL FACTORS Age, Behavior pattern, Fitness, Past/current experiences, Time since onset of injury/illness/exacerbation, and 1-2 comorbidities:   are also affecting patient's functional outcome.    REHAB POTENTIAL: good      CLINICAL DECISION MAKING: Stable/uncomplicated   EVALUATION COMPLEXITY: Low     GOALS:     SHORT TERM GOALS: Target date: 12/10/2023     Pt will become independent with HEP in order to demonstrate synthesis of PT education.    Goal status: INITIAL   2.  Pt will report at least 2 pt reduction on NPRS scale for pain in order to demonstrate functional improvement with household activity, self care, and ADL.    Goal status: INITIAL   3.  Pt will be able to demonstrate ability to descend stairs with reciprocal pattern and single UE in order to demonstrate functional improvement in LE function for self-care and house hold duties.    Goal status: INITIAL   4.  Pt will have an at least 9 pt improvement in LEFS measure in order to demonstrate MCID improvement  in daily function.  Baseline:  Goal status: INITIAL       LONG TERM GOALS:  01/27/2024    Pt  will become independent with final HEP in order to demonstrate synthesis of PT education. Goal status: INITIAL   2. Pt will have an at least 27 pt improvement in LEFS measure in order to demonstrate MCID improvement in daily function.     Goal status: INITIAL   3.  Pt will be able to demonstrate  full depth DL squat in order to demonstrate functional improvement in L LE strength for return to PLOF and exercise.     Goal status: INITIAL   4.  Pt will be able to  demonstrate/report ability to walk >30 mins without pain or need for assistance in order to demonstrate functional improvement and tolerance to exercise and community mobility.   Goal status: INITIAL     PLAN: PT FREQUENCY: 1-2x/week   PT DURATION: 12 wks   PLANNED INTERVENTIONS: Therapeutic exercises, Therapeutic activity, Neuromuscular re-education, Balance training, Gait training, Patient/Family education, Self Care, Joint mobilization, Joint manipulation, Stair training, Orthotic/Fit training, DME instructions, Aquatic Therapy, Dry Needling, Electrical stimulation, Spinal manipulation, Spinal mobilization, Cryotherapy, Moist heat, scar mobilization, Splintting, Taping, Vasopneumatic device, Traction, Ultrasound, Ionotophoresis 4mg /ml Dexamethasone , Manual therapy, and Re-evaluation   PLAN FOR NEXT SESSION: PROM per protocol, gait training   Jacqualin Shirkey C. Noah Pelaez PT, DPT 11/20/23 12:32 PM     For all possible CPT codes, reference the Planned Interventions line above.     Check all conditions that are expected to impact treatment: {Conditions expected to impact treatment:Musculoskeletal disorders   If treatment provided at initial evaluation, no treatment charged due to lack of authorization.

## 2023-11-23 ENCOUNTER — Other Ambulatory Visit: Payer: Self-pay | Admitting: *Deleted

## 2023-11-23 ENCOUNTER — Ambulatory Visit (HOSPITAL_BASED_OUTPATIENT_CLINIC_OR_DEPARTMENT_OTHER): Admitting: Physical Therapy

## 2023-11-23 ENCOUNTER — Encounter (HOSPITAL_BASED_OUTPATIENT_CLINIC_OR_DEPARTMENT_OTHER): Payer: Self-pay | Admitting: Physical Therapy

## 2023-11-23 DIAGNOSIS — M25552 Pain in left hip: Secondary | ICD-10-CM | POA: Diagnosis not present

## 2023-11-23 DIAGNOSIS — R262 Difficulty in walking, not elsewhere classified: Secondary | ICD-10-CM

## 2023-11-23 DIAGNOSIS — M6281 Muscle weakness (generalized): Secondary | ICD-10-CM

## 2023-11-23 MED ORDER — TOPIRAMATE 200 MG PO TABS: 200.0000 mg | ORAL_TABLET | Freq: Every day | ORAL | 0 refills | Status: DC

## 2023-11-23 NOTE — Therapy (Signed)
 OUTPATIENT PHYSICAL THERAPY EVALUATION   Patient Name: Sharon King MRN: [id number] DOB:31-Oct-1985, 38 y.o., female Today's Date: 11/23/2023  END OF SESSION:  PT End of Session - 11/23/23 1030     Visit Number 4    Number of Visits 18    Date for PT Re-Evaluation 01/27/24    Authorization Type [employer]    Authorization Time Period 7.18-8.30.25    Authorization - Number of Visits 15    PT Start Time 1016    PT Stop Time 1056    PT Time Calculation (min) 40 min    Activity Tolerance Patient tolerated treatment well    Behavior During Therapy [facility] for tasks assessed/performed            Past Medical History:  Diagnosis Date   Acute upper GI bleed 08/02/2019   Allergy    Anxiety    Asthma    Cancer (HCC)    GERD (gastroesophageal reflux disease)    Migraines    Neuromuscular disorder (HCC)    Seizures (HCC)    Last seizure 2014   Sleep apnea    pt denies   Trigeminal neuralgia    Uterine fibroid    Past Surgical History:  Procedure Laterality Date   BREAST ENHANCEMENT SURGERY  2008   BREAST SURGERY     CESAREAN SECTION N/A 06/13/2015   Procedure: CESAREAN SECTION;  Surgeon: Elveria Mungo, MD;  Location: [facility];  Service: Obstetrics;  Laterality: N/A;   COSMETIC SURGERY     ESOPHAGOGASTRODUODENOSCOPY (EGD) WITH PROPOFOL  N/A 08/30/2019   Procedure: ESOPHAGOGASTRODUODENOSCOPY (EGD) WITH PROPOFOL ;  Surgeon: Janalyn Keene NOVAK, MD;  Location: [facility];  Service: Endoscopy;  Laterality: N/A;   wisdom teeth     Patient Active Problem List   Diagnosis Date Noted   Tear of left acetabular labrum 10/25/2023   Right ovarian cyst 09/30/2023   Adjustment disorder with mixed anxiety and depressed mood 01/20/2022   Urticaria 12/22/2019   Chronic migraine without aura 06/23/2019   Muscle spasm 02/18/2016   Allergic rhinitis 04/22/2007   Asthma 11/06/2006   Migraines 11/06/2006     REFERRING PROVIDER:  Genelle Standing, MD     REFERRING DIAG:  [phone] (ICD-10-CM) - Pain in left hip  Will be post op labral repair hip      Rationale for Evaluation and Treatment: Rehabilitation  THERAPY DIAG:  Pain in left hip  Difficulty in walking, not elsewhere classified  Muscle weakness (generalized)  ONSET DATE: Days since surgery: 29 PROCEDURE: 1. 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 She reports no pain with exercise. She reports taking 2 days to recover. Pt  still has difficulty with bending fwd to tie shoes. Sleeping is uncomfortable and hard to get into a good position.   PERTINENT HISTORY:  See pmh  PAIN:  Are you having pain? Yes: NPRS scale: 2-3/10 Pain location: L hip Pain description: aching surgical pain   PRECAUTIONS:  None  RED FLAGS: None   WEIGHT BEARING RESTRICTIONS:  WBAT  FALLS:  Has patient fallen in last 6 months? No  OCCUPATION:  Not currently working   PLOF:  Independent  PATIENT GOALS:  Decrease pain, return to normal walking and activity   Home: 3 steps to enter with a rail Equipment: crutches, no shower chair at home    OBJECTIVE:  Note: Objective measures were completed at Evaluation unless otherwise noted.  DIAGNOSTIC FINDINGS:  FINDINGS:   Bones and labrum: There is no fracture or accelerated arthrosis. Full-thickness cartilage defect, subchondral reactive edema or joint effusion is present. No displaced labral tear is identified. Pelvic ring, sacrum and SI joints are unremarkable.   Musculotendinous structures: Gluteus medius and minimus tendons are unremarkable. Hamstring tendon is unremarkable. There is no greater trochanteric bursal  collection. No significant tendinosis or myositis is present.   IMPRESSION: No accelerated arthrosis or displaced labral tear is identified.   No significant tendinosis or myositis is present.   There is a likely functional ovarian cyst right adnexa measuring approximately 4.4 cm in size. Follow-up ultrasound suggested and 6-12 weeks to ensure resolution.   Electronically signed by: Norleen Satchel MD 09/27/2023 11:09 AM EDT RP Workstation: [id number]   Extreme difficulty/unable (0), Quite a bit of difficulty (1), Moderate difficulty (2), Little difficulty (3), No difficulty (4) Survey date:  eval  Any of your usual work, housework or school activities 2  2. Usual hobbies, recreational or sporting activities 2  3. Getting into/out of the bath 3  4. Walking between rooms 2  5. Putting on socks/shoes 1  6. Squatting  1  7. Lifting an object, like a bag of groceries from the floor 1  8. Performing light activities around your home 2  9. Performing heavy activities around your home 0  10. Getting into/out of a car 1  11. Walking 2 blocks 1  12. Walking 1 mile 1  13. Going up/down 10 stairs (1 flight) 1  14. Standing for 1 hour 1  15.  sitting for 1 hour 1  16. Running on even ground 0  17. Running on uneven ground 0  18. Making sharp turns while running fast 0  19. Hopping  0  20. Rolling over in bed 1  Score total:  21/80     COGNITION:           Overall cognitive status: Within functional limits for tasks assessed                          SENSATION: Light touch: Impaired      POSTURE: weight shift right in seated, R trunk lean   PALPATION:  TTP along L greater trochanter;  mild edema noted into greater trochanteric region; surgical bandage in place no signs of drainage or erythema, bruising around surgical sites   LOWER EXTREMITY ROM:  PROM ROM Right eval Left eval  Hip flexion 120 90 p!  Hip extension 10 0  Hip abduction 30 10   Hip adduction      Hip  internal rotation 35 5 p!  Hip external rotation 40 5 p!   (Blank rows = not tested)   LOWER EXTREMITY MMT: not indicated at this time     GAIT: Distance walked: 67ft Assistive device utilized: None Level of assistance: modified Independence Comments: WBAT, bilat crutches      TODAY'S TREATMENT:  8/12  Recumbent bike 5 min  Supine SKTC 10s 10x LTR 2x10 Modified thomas stretch EOB 30s 3x Bridge 3x8 Prone quad stretch 30s 3x Seated lumbar flexion reaching stretch 10s 10x Seated piriformis stretch 30s 3x  Walking volume management, spikes in activity, rebound pain/effusion    Treatment                            8/9: Blank lines following charge title = not provided on this treatment date.   Manual:  TPDN No IASTM VL, piriformis, glut med There-ex:  There-Act:  Self Care:  Nuro-Re-ed: Adduction + ab set, added bridge with hamstring focus Standing hip hinge- bar along spine for form Dead lift- double and single leg with toe prop, bar to knees Gait Training:    Treatment                            7/24: Blank lines following charge title = not provided on this treatment date.   Manual:  TPDN No STM hip flexors Hip PROM, gentle distraction There-ex: Isometric HS curl over swiss ball + ab set HS curl + mini bridge, ball bw knees Swiss ball roll in/out with small ball bw knees There-Act:  Self Care:  Nuro-Re-ed:  Gait Training: Without crutch    PATIENT EDUCATION:  Education details: Teacher, music of condition, POC, HEP, exercise form/rationale    Person educated: Patient Education method: Explanation, Demonstration, Tactile cues, Verbal cues, and Handouts Education comprehension: verbalized understanding, returned demonstration, verbal cues required, and tactile cues required     HOME EXERCISE PROGRAM:  Access Code: [email or web address] URL: [email or web address] Date: 10/29/2023 Prepared by: Dale Call   ASSESSMENT:   CLINICAL  IMPRESSION: Pt session focused primarily on ROM as pt's L hip is limited in all planes due to joint tightness and soft tissue restriction. Pt was able to improve gait quality by end of session but has apprehension and guarding tendency of L hip. HEP updated today accordingly to progress ROM in all planes as pt is 4 wks post op and beyond ROM restrictions. Pt would benefit from continued skilled therapy in order to reach goals and maximize functional L LE strength and ROM for return to ADL, normalized gait, and household duties.       OBJECTIVE IMPAIRMENTS Abnormal gait, decreased activity tolerance, decreased balance, decreased endurance, decreased mobility, difficulty walking, decreased ROM, decreased strength, hypomobility, increased fascial restrictions, increased muscle spasms, impaired flexibility, improper body mechanics, postural dysfunction, and pain.    ACTIVITY LIMITATIONS carrying, lifting, bending, sitting, standing, squatting, stairs, transfers, and locomotion level   PARTICIPATION LIMITATIONS: cleaning, laundry, interpersonal relationship, driving, shopping, community activity, occupation, and exercise   PERSONAL FACTORS Age, Behavior pattern, Fitness, Past/current experiences, Time since onset of injury/illness/exacerbation, and 1-2 comorbidities:   are also affecting patient's functional outcome.    REHAB POTENTIAL: good      CLINICAL DECISION MAKING: Stable/uncomplicated   EVALUATION COMPLEXITY: Low     GOALS:  SHORT TERM GOALS: Target date: 12/10/2023     Pt will become independent with HEP in order to demonstrate synthesis of PT education.    Goal status: INITIAL   2.  Pt will report at least 2 pt reduction on NPRS scale for pain in order to demonstrate functional improvement with household activity, self care, and ADL.    Goal status: INITIAL   3.  Pt will be able to demonstrate ability to descend stairs with reciprocal pattern and single UE in order to  demonstrate functional improvement in LE function for self-care and house hold duties.    Goal status: INITIAL   4.  Pt will have an at least 9 pt improvement in LEFS measure in order to demonstrate MCID improvement in daily function.  Baseline:  Goal status: INITIAL       LONG TERM GOALS:  01/27/2024    Pt  will become independent with final HEP in order to demonstrate synthesis of PT education. Goal status: INITIAL   2. Pt will have an at least 27 pt improvement in LEFS measure in order to demonstrate MCID improvement in daily function.     Goal status: INITIAL   3.  Pt will be able to demonstrate  full depth DL squat in order to demonstrate functional improvement in L LE strength for return to PLOF and exercise.     Goal status: INITIAL   4.  Pt will be able to demonstrate/report ability to walk >30 mins without pain or need for assistance in order to demonstrate functional improvement and tolerance to exercise and community mobility.   Goal status: INITIAL     PLAN: PT FREQUENCY: 1-2x/week   PT DURATION: 12 wks   PLANNED INTERVENTIONS: Therapeutic exercises, Therapeutic activity, Neuromuscular re-education, Balance training, Gait training, Patient/Family education, Self Care, Joint mobilization, Joint manipulation, Stair training, Orthotic/Fit training, DME instructions, Aquatic Therapy, Dry Needling, Electrical stimulation, Spinal manipulation, Spinal mobilization, Cryotherapy, Moist heat, scar mobilization, Splintting, Taping, Vasopneumatic device, Traction, Ultrasound, Ionotophoresis 4mg /ml Dexamethasone , Manual therapy, and Re-evaluation   PLAN FOR NEXT SESSION: PROM per protocol, gait training   Dale Call PT, DPT 11/23/23 11:03 AM      For all possible CPT codes, reference the Planned Interventions line above.     Check all conditions that are expected to impact treatment: {Conditions expected to impact treatment:Musculoskeletal disorders   If treatment  provided at initial evaluation, no treatment charged due to lack of authorization.

## 2023-11-25 ENCOUNTER — Ambulatory Visit (HOSPITAL_BASED_OUTPATIENT_CLINIC_OR_DEPARTMENT_OTHER): Admitting: Physical Therapy

## 2023-11-26 ENCOUNTER — Encounter: Payer: Self-pay | Admitting: Physician Assistant

## 2023-11-26 ENCOUNTER — Telehealth (INDEPENDENT_AMBULATORY_CARE_PROVIDER_SITE_OTHER): Admitting: Physician Assistant

## 2023-11-26 DIAGNOSIS — G43009 Migraine without aura, not intractable, without status migrainosus: Secondary | ICD-10-CM | POA: Diagnosis not present

## 2023-11-26 DIAGNOSIS — M62838 Other muscle spasm: Secondary | ICD-10-CM

## 2023-11-26 MED ORDER — TIZANIDINE HCL 4 MG PO TABS: 4.0000 mg | ORAL_TABLET | Freq: Every evening | ORAL | 5 refills | Status: AC | PRN

## 2023-11-26 MED ORDER — TOPIRAMATE 200 MG PO TABS: 200.0000 mg | ORAL_TABLET | Freq: Every day | ORAL | 3 refills | Status: AC

## 2023-11-26 MED ORDER — REYVOW 100 MG PO TABS: 100.0000 mg | ORAL_TABLET | ORAL | 3 refills | Status: AC | PRN

## 2023-11-26 NOTE — Progress Notes (Signed)
 TELEHEALTH HEADACHE VISIT ENCOUNTER NOTE  Provider location: [facility]   Patient location: Home  I connected with Sharon King on 11/26/23 at 11:00 AM EDT by MyChart and verified that I am speaking with the correct person using two identifiers.    I discussed the limitations, risks, security and privacy concerns of performing an evaluation and management service virtually and the availability of in person appointments. I also discussed with the patient that there may be a patient responsible charge related to this service. The patient expressed understanding and agreed to proceed.   History:  Sharon King is a 38 y.o. [id number] female being evaluated today for headache.  She states they are well controlled and she needed appt to continue meds.  Pt using tizanidine  to help with muscle spasm, sleep and headache.  It works better than Flexeril .  Quality of headaches is reportedly unchanged. Although she reports times with daily mild HA.  Ubrelvy  is good but hasn't needed often.  Continuing Topamax  at 200mg  daily.  Hip surgery earlier this summer and doing much better now.   Has changed from Sprintec OCP to norethinedrone.      Past Medical History:  Diagnosis Date   Acute upper GI bleed 08/02/2019   Allergy    Anxiety    Asthma    Cancer (HCC)    GERD (gastroesophageal reflux disease)    Migraines    Neuromuscular disorder (HCC)    Seizures (HCC)    Last seizure 2014   Sleep apnea    pt denies   Trigeminal neuralgia    Uterine fibroid    Past Surgical History:  Procedure Laterality Date   BREAST ENHANCEMENT SURGERY  2008   BREAST SURGERY     CESAREAN SECTION N/A 06/13/2015   Procedure: CESAREAN SECTION;  Surgeon: Elveria Mungo, MD;  Location: [facility];  Service: Obstetrics;  Laterality: N/A;   COSMETIC SURGERY     ESOPHAGOGASTRODUODENOSCOPY (EGD) WITH PROPOFOL  N/A 08/30/2019   Procedure: ESOPHAGOGASTRODUODENOSCOPY  (EGD) WITH PROPOFOL ;  Surgeon: Janalyn Keene NOVAK, MD;  Location: [facility];  Service: Endoscopy;  Laterality: N/A;   wisdom teeth     The following portions of the patient's history were reviewed and updated as appropriate: allergies, current medications, past family history, past medical history, past social history, past surgical history and problem list.    Review of Systems:  Pertinent items noted in HPI and remainder of comprehensive ROS otherwise negative.  Physical Exam:   General:  Alert, oriented and cooperative.   Mental Status: Normal mood and affect perceived. Normal judgment and thought content.  Physical exam deferred due to nature of the encounter  Labs and Imaging No results found for this or any previous visit (from the past 2 weeks). No results found.    Assessment and Plan:     1. Migraine without aura and without status migrainosus, not intractable   2. Muscle spasm         Refill Reyvow ,Tizanidine . Will put Nurtec samples out for patient to try.  If they work well, she can get rx.  If not, we can refill ubrelvy .    I discussed the assessment and treatment plan with the patient. The patient was provided an opportunity to ask questions and all were answered. The patient agreed with the plan and demonstrated an understanding of the instructions.   The patient was advised to call back or seek an in-person evaluation/go to the ED if the  symptoms worsen or if the condition fails to improve as anticipated.  I provided 22 minutes of virtual face-to-face time during this encounter.   Darice FORBES Nance Gretta, PA-C [facility] for [employer], [facility]

## 2023-11-27 ENCOUNTER — Ambulatory Visit (HOSPITAL_BASED_OUTPATIENT_CLINIC_OR_DEPARTMENT_OTHER)

## 2023-11-27 ENCOUNTER — Encounter (HOSPITAL_BASED_OUTPATIENT_CLINIC_OR_DEPARTMENT_OTHER): Payer: Self-pay

## 2023-11-27 DIAGNOSIS — R262 Difficulty in walking, not elsewhere classified: Secondary | ICD-10-CM

## 2023-11-27 DIAGNOSIS — M25552 Pain in left hip: Secondary | ICD-10-CM | POA: Diagnosis not present

## 2023-11-27 DIAGNOSIS — M6281 Muscle weakness (generalized): Secondary | ICD-10-CM

## 2023-11-27 NOTE — Therapy (Signed)
 OUTPATIENT PHYSICAL THERAPY TREATMENT   Patient Name: Sharon King MRN: [id number] DOB:1985/05/06, 38 y.o., female Today's Date: 11/27/2023  END OF SESSION:  PT End of Session - 11/27/23 1018     Visit Number 5    Number of Visits 18    Date for PT Re-Evaluation 01/27/24    Authorization Type [employer]    Authorization Time Period 7.18-8.30.25    Authorization - Number of Visits 15    PT Start Time 1001    PT Stop Time 1045    PT Time Calculation (min) 44 min    Activity Tolerance Patient tolerated treatment well    Behavior During Therapy [facility] for tasks assessed/performed             Past Medical History:  Diagnosis Date   Acute upper GI bleed 08/02/2019   Allergy    Anxiety    Asthma    Cancer (HCC)    GERD (gastroesophageal reflux disease)    Migraines    Neuromuscular disorder (HCC)    Seizures (HCC)    Last seizure 2014   Sleep apnea    pt denies   Trigeminal neuralgia    Uterine fibroid    Past Surgical History:  Procedure Laterality Date   BREAST ENHANCEMENT SURGERY  2008   BREAST SURGERY     CESAREAN SECTION N/A 06/13/2015   Procedure: CESAREAN SECTION;  Surgeon: Elveria Mungo, MD;  Location: [facility];  Service: Obstetrics;  Laterality: N/A;   COSMETIC SURGERY     ESOPHAGOGASTRODUODENOSCOPY (EGD) WITH PROPOFOL  N/A 08/30/2019   Procedure: ESOPHAGOGASTRODUODENOSCOPY (EGD) WITH PROPOFOL ;  Surgeon: Janalyn Keene NOVAK, MD;  Location: [facility] ENDOS[facility];  Service: Endoscopy;  Laterality: N/A;   wisdom teeth     Patient Active Problem List   Diagnosis Date Noted   Tear of left acetabular labrum 10/25/2023   Right ovarian cyst 09/30/2023   Adjustment disorder with mixed anxiety and depressed mood 01/20/2022   Urticaria 12/22/2019   Chronic migraine without aura 06/23/2019   Muscle spasm 02/18/2016   Allergic rhinitis 04/22/2007   Asthma 11/06/2006   Migraines 11/06/2006     REFERRING PROVIDER:  Genelle Standing, MD     REFERRING DIAG:  [phone] (ICD-10-CM) - Pain in left hip  Will be post op labral repair hip      Rationale for Evaluation and Treatment: Rehabilitation  THERAPY DIAG:  Pain in left hip  Muscle weakness (generalized)  Difficulty in walking, not elsewhere classified  ONSET DATE: Days since surgery: 33 PROCEDURE: 1. Left hip labral repair    **INSURANCE DENIED AQUATICS**  SUBJECTIVE:  SUBJECTIVE STATEMENT: Pt reports 2-3/10 pain level at entry. Has been sitting more due to a family emergency. Feels tightness/discomfort in posterior L hip and SIJ area.   PERTINENT HISTORY:  See pmh  PAIN:  Are you having pain? Yes: NPRS scale: 2-3/10 Pain location: L hip Pain description: aching surgical pain   PRECAUTIONS:  None  RED FLAGS: None   WEIGHT BEARING RESTRICTIONS:  WBAT  FALLS:  Has patient fallen in last 6 months? No  OCCUPATION:  Not currently working   PLOF:  Independent  PATIENT GOALS:  Decrease pain, return to normal walking and activity   Home: 3 steps to enter with a rail Equipment: crutches, no shower chair at home    OBJECTIVE:  Note: Objective measures were completed at Evaluation unless otherwise noted.  DIAGNOSTIC FINDINGS:  FINDINGS:   Bones and labrum: There is no fracture or accelerated arthrosis. Full-thickness cartilage defect, subchondral reactive edema or joint effusion is present. No displaced labral tear is identified. Pelvic ring, sacrum and SI joints are unremarkable.   Musculotendinous structures: Gluteus medius and minimus tendons are unremarkable. Hamstring tendon is unremarkable. There is no greater trochanteric bursal collection. No significant tendinosis or myositis is present.   IMPRESSION: No accelerated arthrosis or displaced  labral tear is identified.   No significant tendinosis or myositis is present.   There is a likely functional ovarian cyst right adnexa measuring approximately 4.4 cm in size. Follow-up ultrasound suggested and 6-12 weeks to ensure resolution.   Electronically signed by: Norleen Satchel MD 09/27/2023 11:09 AM EDT RP Workstation: [id number]   Extreme difficulty/unable (0), Quite a bit of difficulty (1), Moderate difficulty (2), Little difficulty (3), No difficulty (4) Survey date:  eval  Any of your usual work, housework or school activities 2  2. Usual hobbies, recreational or sporting activities 2  3. Getting into/out of the bath 3  4. Walking between rooms 2  5. Putting on socks/shoes 1  6. Squatting  1  7. Lifting an object, like a bag of groceries from the floor 1  8. Performing light activities around your home 2  9. Performing heavy activities around your home 0  10. Getting into/out of a car 1  11. Walking 2 blocks 1  12. Walking 1 mile 1  13. Going up/down 10 stairs (1 flight) 1  14. Standing for 1 hour 1  15.  sitting for 1 hour 1  16. Running on even ground 0  17. Running on uneven ground 0  18. Making sharp turns while running fast 0  19. Hopping  0  20. Rolling over in bed 1  Score total:  21/80     COGNITION:           Overall cognitive status: Within functional limits for tasks assessed                          SENSATION: Light touch: Impaired      POSTURE: weight shift right in seated, R trunk lean   PALPATION:  TTP along L greater trochanter;  mild edema noted into greater trochanteric region; surgical bandage in place no signs of drainage or erythema, bruising around surgical sites   LOWER EXTREMITY ROM:  PROM ROM Right eval Left eval  Hip flexion 120 90 p!  Hip extension 10 0  Hip abduction 30 10   Hip adduction      Hip internal rotation 35 5 p!  Hip external rotation  40 5 p!   (Blank rows = not tested)   LOWER EXTREMITY MMT: not indicated  at this time     GAIT: Distance walked: 38ft Assistive device utilized: None Level of assistance: modified [place] Comments: WBAT, bilat crutches      TODAY'S TREATMENT:   8/16  Recumbent bike 5 min L2  PROM L hip STM to L gluteal mm, QL Modified thomas stretch EOB 30s 3x Bridge 2x10 S/l Clams 2x10 S/l Reverse clams 2x10 Prone hip extension x10 low range with tactile cues (unable to complete full range without significant lumbar engagement) LAQ 4# 5 2x15 Partial squats x10      8/12  Recumbent bike 5 min  Supine SKTC 10s 10x LTR 2x10 Modified thomas stretch EOB 30s 3x Bridge 3x8 Prone quad stretch 30s 3x Seated lumbar flexion reaching stretch 10s 10x Seated piriformis stretch 30s 3x  Walking volume management, spikes in activity, rebound pain/effusion    Treatment                            8/9: Blank lines following charge title = not provided on this treatment date.   Manual:  TPDN No IASTM VL, piriformis, glut med There-ex:  There-Act:  Self Care:  Nuro-Re-ed: Adduction + ab set, added bridge with hamstring focus Standing hip hinge- bar along spine for form Dead lift- double and single leg with toe prop, bar to knees Gait Training:    Treatment                            7/24: Blank lines following charge title = not provided on this treatment date.   Manual:  TPDN No STM hip flexors Hip PROM, gentle distraction There-ex: Isometric HS curl over swiss ball + ab set HS curl + mini bridge, ball bw knees Swiss ball roll in/out with small ball bw knees There-Act:  Self Care:  Nuro-Re-ed:  Gait Training: Without crutch    PATIENT EDUCATION:  Education details: Teacher, music of condition, POC, HEP, exercise form/rationale    Person educated: Patient Education method: Explanation, Demonstration, Tactile cues, Verbal cues, and Handouts Education comprehension: verbalized understanding, returned demonstration, verbal cues required,  and tactile cues required     HOME EXERCISE PROGRAM:  Access Code: [email or web address] URL: [email or web address] Date: 10/29/2023 Prepared by: Dale Call   ASSESSMENT:   CLINICAL IMPRESSION: Good tolerance for ROM in all planes, with mild restrictions in IR/ER. Ongoing tightness within L glutes and QL mm so STM and TPR was utilized to decrease this. Verbally reviewed tennis ball mobilization for her to address this outside of clinic. No c/o pain in L hip with exercises. Good tolerance for clamshell exercises, though muscular fatigue reported. Pt progressing well.       OBJECTIVE IMPAIRMENTS Abnormal gait, decreased activity tolerance, decreased balance, decreased endurance, decreased mobility, difficulty walking, decreased ROM, decreased strength, hypomobility, increased fascial restrictions, increased muscle spasms, impaired flexibility, improper body mechanics, postural dysfunction, and pain.    ACTIVITY LIMITATIONS carrying, lifting, bending, sitting, standing, squatting, stairs, transfers, and locomotion level   PARTICIPATION LIMITATIONS: cleaning, laundry, interpersonal relationship, driving, shopping, community activity, occupation, and exercise   PERSONAL FACTORS Age, Behavior pattern, Fitness, Past/current experiences, Time since onset of injury/illness/exacerbation, and 1-2 comorbidities:   are also affecting patient's functional outcome.    REHAB POTENTIAL: good      CLINICAL DECISION MAKING: Stable/uncomplicated   EVALUATION COMPLEXITY: Low  GOALS:     SHORT TERM GOALS: Target date: 12/10/2023     Pt will become independent with HEP in order to demonstrate synthesis of PT education.    Goal status: INITIAL   2.  Pt will report at least 2 pt reduction on NPRS scale for pain in order to demonstrate functional improvement with household activity, self care, and ADL.    Goal status: INITIAL   3.  Pt will be able to demonstrate ability to descend stairs with  reciprocal pattern and single UE in order to demonstrate functional improvement in LE function for self-care and house hold duties.    Goal status: INITIAL   4.  Pt will have an at least 9 pt improvement in LEFS measure in order to demonstrate MCID improvement in daily function.  Baseline:  Goal status: INITIAL       LONG TERM GOALS:  01/27/2024    Pt  will become independent with final HEP in order to demonstrate synthesis of PT education. Goal status: INITIAL   2. Pt will have an at least 27 pt improvement in LEFS measure in order to demonstrate MCID improvement in daily function.     Goal status: INITIAL   3.  Pt will be able to demonstrate  full depth DL squat in order to demonstrate functional improvement in L LE strength for return to PLOF and exercise.     Goal status: INITIAL   4.  Pt will be able to demonstrate/report ability to walk >30 mins without pain or need for assistance in order to demonstrate functional improvement and tolerance to exercise and community mobility.   Goal status: INITIAL     PLAN: PT FREQUENCY: 1-2x/week   PT DURATION: 12 wks   PLANNED INTERVENTIONS: Therapeutic exercises, Therapeutic activity, Neuromuscular re-education, Balance training, Gait training, Patient/Family education, Self Care, Joint mobilization, Joint manipulation, Stair training, Orthotic/Fit training, DME instructions, Aquatic Therapy, Dry Needling, Electrical stimulation, Spinal manipulation, Spinal mobilization, Cryotherapy, Moist heat, scar mobilization, Splintting, Taping, Vasopneumatic device, Traction, Ultrasound, Ionotophoresis 4mg /ml Dexamethasone , Manual therapy, and Re-evaluation   PLAN FOR NEXT SESSION: PROM per protocol, gait training   Asberry Rodes, PTA   11/27/23 11:01 AM      For all possible CPT codes, reference the Planned Interventions line above.     Check all conditions that are expected to impact treatment: {Conditions expected to impact  treatment:Musculoskeletal disorders   If treatment provided at initial evaluation, no treatment charged due to lack of authorization.

## 2023-11-29 ENCOUNTER — Ambulatory Visit (HOSPITAL_BASED_OUTPATIENT_CLINIC_OR_DEPARTMENT_OTHER): Admitting: Physical Therapy

## 2023-11-29 ENCOUNTER — Encounter (HOSPITAL_BASED_OUTPATIENT_CLINIC_OR_DEPARTMENT_OTHER): Payer: Self-pay | Admitting: Physical Therapy

## 2023-11-29 DIAGNOSIS — M6281 Muscle weakness (generalized): Secondary | ICD-10-CM

## 2023-11-29 DIAGNOSIS — R262 Difficulty in walking, not elsewhere classified: Secondary | ICD-10-CM

## 2023-11-29 DIAGNOSIS — M25552 Pain in left hip: Secondary | ICD-10-CM | POA: Diagnosis not present

## 2023-11-29 NOTE — Therapy (Signed)
 OUTPATIENT PHYSICAL THERAPY TREATMENT   Patient Name: Sharon King MRN: [id number] DOB:1986-01-10, 38 y.o., female Today's Date: 11/29/2023  END OF SESSION:  PT End of Session - 11/29/23 1150     Visit Number 6    Number of Visits 18    Date for PT Re-Evaluation 01/27/24    Authorization Type [employer]    Authorization Time Period 7.18-8.30.25    Authorization - Number of Visits 15    PT Start Time 1149    PT Stop Time 1230    PT Time Calculation (min) 41 min    Activity Tolerance Patient tolerated treatment well    Behavior During Therapy [facility] for tasks assessed/performed             Past Medical History:  Diagnosis Date   Acute upper GI bleed 08/02/2019   Allergy    Anxiety    Asthma    Cancer (HCC)    GERD (gastroesophageal reflux disease)    Migraines    Neuromuscular disorder (HCC)    Seizures (HCC)    Last seizure 2014   Sleep apnea    pt denies   Trigeminal neuralgia    Uterine fibroid    Past Surgical History:  Procedure Laterality Date   BREAST ENHANCEMENT SURGERY  2008   BREAST SURGERY     CESAREAN SECTION N/A 06/13/2015   Procedure: CESAREAN SECTION;  Surgeon: Elveria Mungo, MD;  Location: [facility];  Service: Obstetrics;  Laterality: N/A;   COSMETIC SURGERY     ESOPHAGOGASTRODUODENOSCOPY (EGD) WITH PROPOFOL  N/A 08/30/2019   Procedure: ESOPHAGOGASTRODUODENOSCOPY (EGD) WITH PROPOFOL ;  Surgeon: Janalyn Keene NOVAK, MD;  Location: [facility];  Service: Endoscopy;  Laterality: N/A;   wisdom teeth     Patient Active Problem List   Diagnosis Date Noted   Tear of left acetabular labrum 10/25/2023   Right ovarian cyst 09/30/2023   Adjustment disorder with mixed anxiety and depressed mood 01/20/2022   Urticaria 12/22/2019   Chronic migraine without aura 06/23/2019   Muscle spasm 02/18/2016   Allergic rhinitis 04/22/2007   Asthma 11/06/2006   Migraines 11/06/2006     REFERRING PROVIDER:  Genelle Standing, MD     REFERRING DIAG:  [id number] (ICD-10-CM) - Pain in left hip  Will be post op labral repair hip      Rationale for Evaluation and Treatment: Rehabilitation  THERAPY DIAG:  Pain in left hip  Muscle weakness (generalized)  Difficulty in walking, not elsewhere classified  ONSET DATE: Days since surgery: 35 PROCEDURE: 1. Left hip labral repair    **INSURANCE DENIED AQUATICS**  SUBJECTIVE:  SUBJECTIVE STATEMENT: Father in law moving in with them after cardiac surgery. Hip is the least of my worries right now. I was able to tie on tennis shoes on this Saturday.   PERTINENT HISTORY:  See pmh  PAIN:  Are you having pain? Yes: NPRS scale: 2-3/10 Pain location: L hip Pain description: aching surgical pain   PRECAUTIONS:  None  RED FLAGS: None   WEIGHT BEARING RESTRICTIONS:  WBAT  FALLS:  Has patient fallen in last 6 months? No  OCCUPATION:  Not currently working   PLOF:  Independent  PATIENT GOALS:  Decrease pain, return to normal walking and activity   Home: 3 steps to enter with a rail Equipment: crutches, no shower chair at home    OBJECTIVE:  Note: Objective measures were completed at Evaluation unless otherwise noted.  DIAGNOSTIC FINDINGS:  FINDINGS:   Bones and labrum: There is no fracture or accelerated arthrosis. Full-thickness cartilage defect, subchondral reactive edema or joint effusion is present. No displaced labral tear is identified. Pelvic ring, sacrum and SI joints are unremarkable.   Musculotendinous structures: Gluteus medius and minimus tendons are unremarkable. Hamstring tendon is unremarkable. There is no greater trochanteric bursal collection. No significant tendinosis or myositis is present.   IMPRESSION: No accelerated arthrosis or displaced  labral tear is identified.   No significant tendinosis or myositis is present.   There is a likely functional ovarian cyst right adnexa measuring approximately 4.4 cm in size. Follow-up ultrasound suggested and 6-12 weeks to ensure resolution.   Electronically signed by: Norleen Satchel MD 09/27/2023 11:09 AM EDT RP Workstation: [id number]   Extreme difficulty/unable (0), Quite a bit of difficulty (1), Moderate difficulty (2), Little difficulty (3), No difficulty (4) Survey date:  eval 8/18  Any of your usual work, housework or school activities 2 3  2. Usual hobbies, recreational or sporting activities 2 2  3. Getting into/out of the bath 3 4  4. Walking between rooms 2 4  5. Putting on socks/shoes 1 3  6. Squatting  1 3  7. Lifting an object, like a bag of groceries from the floor 1 3  8. Performing light activities around your home 2 4  9. Performing heavy activities around your home 0 4  10. Getting into/out of a car 1 3  11. Walking 2 blocks 1 4  12. Walking 1 mile 1 3  13. Going up/down 10 stairs (1 flight) 1 3  14. Standing for 1 hour 1 3  15.  sitting for 1 hour 1 3  16. Running on even ground 0 2  17. Running on uneven ground 0 2  18. Making sharp turns while running fast 0 2  19. Hopping  0 3  20. Rolling over in bed 1 3  Score total:  21/80 51/80     COGNITION:           Overall cognitive status: Within functional limits for tasks assessed                          SENSATION: Light touch: Impaired      POSTURE: weight shift right in seated, R trunk lean   PALPATION:  TTP along L greater trochanter;  mild edema noted into greater trochanteric region; surgical bandage in place no signs of drainage or erythema, bruising around surgical sites   LOWER EXTREMITY ROM:  PROM ROM Right eval Left eval  Hip flexion 120 90 p!  Hip  extension 10 0  Hip abduction 30 10   Hip adduction      Hip internal rotation 35 5 p!  Hip external rotation 40 5 p!   (Blank rows =  not tested)   LOWER EXTREMITY MMT: not indicated at this time     GAIT: 8/18- pattern WFL.       TODAY'S TREATMENT:  8/18 STM & roller to Lt hip Supine piriformis stretch Seated hip hinge Dead lift 10lb kettle bell Sit<>stand 10lb kettle bell at chest Side squat steps green tband Qped hip ext-blocked by PT Half kneel diagonal chops   8/16  Recumbent bike 5 min L2  PROM L hip STM to L gluteal mm, QL Modified thomas stretch EOB 30s 3x Bridge 2x10 S/l Clams 2x10 S/l Reverse clams 2x10 Prone hip extension x10 low range with tactile cues (unable to complete full range without significant lumbar engagement) LAQ 4# 5 2x15 Partial squats x10      8/12  Recumbent bike 5 min  Supine SKTC 10s 10x LTR 2x10 Modified thomas stretch EOB 30s 3x Bridge 3x8 Prone quad stretch 30s 3x Seated lumbar flexion reaching stretch 10s 10x Seated piriformis stretch 30s 3x  Walking volume management, spikes in activity, rebound pain/effusion    Treatment                            8/9: Blank lines following charge title = not provided on this treatment date.   Manual:  TPDN No IASTM VL, piriformis, glut med There-ex:  There-Act:  Self Care:  Nuro-Re-ed: Adduction + ab set, added bridge with hamstring focus Standing hip hinge- bar along spine for form Dead lift- double and single leg with toe prop, bar to knees Gait Training:    Treatment                            7/24: Blank lines following charge title = not provided on this treatment date.   Manual:  TPDN No STM hip flexors Hip PROM, gentle distraction There-ex: Isometric HS curl over swiss ball + ab set HS curl + mini bridge, ball bw knees Swiss ball roll in/out with small ball bw knees There-Act:  Self Care:  Nuro-Re-ed:  Gait Training: Without crutch    PATIENT EDUCATION:  Education details: Teacher, music of condition, POC, HEP, exercise form/rationale    Person educated: Patient Education  method: Explanation, Demonstration, Tactile cues, Verbal cues, and Handouts Education comprehension: verbalized understanding, returned demonstration, verbal cues required, and tactile cues required     HOME EXERCISE PROGRAM:  Access Code: [email or web address] URL: [email or web address] Date: 10/29/2023 Prepared by: Dale Call   ASSESSMENT:   CLINICAL IMPRESSION: Core weakness and pes planus resulting in difficulty recruiting hip abd and external rotators. Pt is able to perform exercises without pain. Encouraged her to be careful with caregiving tasks when her father in law moves in with them.       OBJECTIVE IMPAIRMENTS Abnormal gait, decreased activity tolerance, decreased balance, decreased endurance, decreased mobility, difficulty walking, decreased ROM, decreased strength, hypomobility, increased fascial restrictions, increased muscle spasms, impaired flexibility, improper body mechanics, postural dysfunction, and pain.    ACTIVITY LIMITATIONS carrying, lifting, bending, sitting, standing, squatting, stairs, transfers, and locomotion level   PARTICIPATION LIMITATIONS: cleaning, laundry, interpersonal relationship, driving, shopping, community activity, occupation, and exercise   PERSONAL FACTORS Age, Behavior pattern, Fitness, Past/current experiences, Time since onset of injury/illness/exacerbation,  and 1-2 comorbidities:   are also affecting patient's functional outcome.    REHAB POTENTIAL: good      CLINICAL DECISION MAKING: Stable/uncomplicated   EVALUATION COMPLEXITY: Low     GOALS:     SHORT TERM GOALS: Target date: 12/10/2023     Pt will become independent with HEP in order to demonstrate synthesis of PT education.    Goal status: INITIAL   2.  Pt will report at least 2 pt reduction on NPRS scale for pain in order to demonstrate functional improvement with household activity, self care, and ADL.    Goal status: INITIAL   3.  Pt will be able to demonstrate  ability to descend stairs with reciprocal pattern and single UE in order to demonstrate functional improvement in LE function for self-care and house hold duties.    Goal status: INITIAL   4.  Pt will have an at least 9 pt improvement in LEFS measure in order to demonstrate MCID improvement in daily function.  Baseline:  Goal status: INITIAL       LONG TERM GOALS:  01/27/2024    Pt  will become independent with final HEP in order to demonstrate synthesis of PT education. Goal status: INITIAL   2. Pt will have an at least 27 pt improvement in LEFS measure in order to demonstrate MCID improvement in daily function.     Goal status: INITIAL   3.  Pt will be able to demonstrate  full depth DL squat in order to demonstrate functional improvement in L LE strength for return to PLOF and exercise.     Goal status: INITIAL   4.  Pt will be able to demonstrate/report ability to walk >30 mins without pain or need for assistance in order to demonstrate functional improvement and tolerance to exercise and community mobility.   Goal status: INITIAL     PLAN: PT FREQUENCY: 1-2x/week   PT DURATION: 12 wks   PLANNED INTERVENTIONS: Therapeutic exercises, Therapeutic activity, Neuromuscular re-education, Balance training, Gait training, Patient/Family education, Self Care, Joint mobilization, Joint manipulation, Stair training, Orthotic/Fit training, DME instructions, Aquatic Therapy, Dry Needling, Electrical stimulation, Spinal manipulation, Spinal mobilization, Cryotherapy, Moist heat, scar mobilization, Splintting, Taping, Vasopneumatic device, Traction, Ultrasound, Ionotophoresis 4mg /ml Dexamethasone , Manual therapy, and Re-evaluation   PLAN FOR NEXT SESSION: PROM per protocol, gait training   Shaylen Nephew C. Shrita Thien PT, DPT 11/29/23 1:00 PM       For all possible CPT codes, reference the Planned Interventions line above.     Check all conditions that are expected to impact treatment:  {Conditions expected to impact treatment:Musculoskeletal disorders   If treatment provided at initial evaluation, no treatment charged due to lack of authorization.

## 2023-12-02 ENCOUNTER — Ambulatory Visit (HOSPITAL_BASED_OUTPATIENT_CLINIC_OR_DEPARTMENT_OTHER): Admitting: Physical Therapy

## 2023-12-02 ENCOUNTER — Encounter (HOSPITAL_BASED_OUTPATIENT_CLINIC_OR_DEPARTMENT_OTHER): Payer: Self-pay | Admitting: Physical Therapy

## 2023-12-02 DIAGNOSIS — M6281 Muscle weakness (generalized): Secondary | ICD-10-CM

## 2023-12-02 DIAGNOSIS — M25552 Pain in left hip: Secondary | ICD-10-CM

## 2023-12-02 NOTE — Therapy (Signed)
 OUTPATIENT PHYSICAL THERAPY TREATMENT   Patient Name: Sharon King MRN: [id number] DOB:01-Jan-1986, 38 y.o., female Today's Date: 12/02/2023  END OF SESSION:  PT End of Session - 12/02/23 1151     Visit Number 7    Number of Visits 18    Date for PT Re-Evaluation 01/27/24    Authorization Type [employer]    Authorization Time Period 7.18-8.30.25    Authorization - Number of Visits 15    PT Start Time 1150    PT Stop Time 1220    PT Time Calculation (min) 30 min    Activity Tolerance Patient tolerated treatment well    Behavior During Therapy [facility] for tasks assessed/performed             Past Medical History:  Diagnosis Date   Acute upper GI bleed 08/02/2019   Allergy    Anxiety    Asthma    Cancer (HCC)    GERD (gastroesophageal reflux disease)    Migraines    Neuromuscular disorder (HCC)    Seizures (HCC)    Last seizure 2014   Sleep apnea    pt denies   Trigeminal neuralgia    Uterine fibroid    Past Surgical History:  Procedure Laterality Date   BREAST ENHANCEMENT SURGERY  2008   BREAST SURGERY     CESAREAN SECTION N/A 06/13/2015   Procedure: CESAREAN SECTION;  Surgeon: Elveria Mungo, MD;  Location: [facility];  Service: Obstetrics;  Laterality: N/A;   COSMETIC SURGERY     ESOPHAGOGASTRODUODENOSCOPY (EGD) WITH PROPOFOL  N/A 08/30/2019   Procedure: ESOPHAGOGASTRODUODENOSCOPY (EGD) WITH PROPOFOL ;  Surgeon: Janalyn Keene NOVAK, MD;  Location: [facility];  Service: Endoscopy;  Laterality: N/A;   wisdom teeth     Patient Active Problem List   Diagnosis Date Noted   Tear of left acetabular labrum 10/25/2023   Right ovarian cyst 09/30/2023   Adjustment disorder with mixed anxiety and depressed mood 01/20/2022   Urticaria 12/22/2019   Chronic migraine without aura 06/23/2019   Muscle spasm 02/18/2016   Allergic rhinitis 04/22/2007   Asthma 11/06/2006   Migraines 11/06/2006     REFERRING PROVIDER:  Genelle Standing, MD     REFERRING DIAG:  [phone] (ICD-10-CM) - Pain in left hip  Will be post op labral repair hip      Rationale for Evaluation and Treatment: Rehabilitation  THERAPY DIAG:  Pain in left hip  Muscle weakness (generalized)  ONSET DATE: Days since surgery: 38 PROCEDURE: 1. Left hip labral repair    **INSURANCE DENIED AQUATICS**  SUBJECTIVE:  SUBJECTIVE STATEMENT: Father in law is home with us . Knees were sore after last session.   PERTINENT HISTORY:  See pmh  PAIN:  Are you having pain? Yes: NPRS scale: 2-3/10 Pain location: L hip Pain description: aching surgical pain   PRECAUTIONS:  None  RED FLAGS: None   WEIGHT BEARING RESTRICTIONS:  WBAT  FALLS:  Has patient fallen in last 6 months? No  OCCUPATION:  Not currently working   PLOF:  Independent  PATIENT GOALS:  Decrease pain, return to normal walking and activity   Home: 3 steps to enter with a rail Equipment: crutches, no shower chair at home    OBJECTIVE:  Note: Objective measures were completed at Evaluation unless otherwise noted.  DIAGNOSTIC FINDINGS:  FINDINGS:   Bones and labrum: There is no fracture or accelerated arthrosis. Full-thickness cartilage defect, subchondral reactive edema or joint effusion is present. No displaced labral tear is identified. Pelvic ring, sacrum and SI joints are unremarkable.   Musculotendinous structures: Gluteus medius and minimus tendons are unremarkable. Hamstring tendon is unremarkable. There is no greater trochanteric bursal collection. No significant tendinosis or myositis is present.   IMPRESSION: No accelerated arthrosis or displaced labral tear is identified.   No significant tendinosis or myositis is present.   There is a likely functional ovarian cyst right  adnexa measuring approximately 4.4 cm in size. Follow-up ultrasound suggested and 6-12 weeks to ensure resolution.   Electronically signed by: Norleen Satchel MD 09/27/2023 11:09 AM EDT RP Workstation: [id number]   Extreme difficulty/unable (0), Quite a bit of difficulty (1), Moderate difficulty (2), Little difficulty (3), No difficulty (4) Survey date:  eval 8/18 8/21  Any of your usual work, housework or school activities 2 3 3   2. Usual hobbies, recreational or sporting activities 2 2 3   3. Getting into/out of the bath 3 4 4   4. Walking between rooms 2 4 4   5. Putting on socks/shoes 1 3 4   6. Squatting  1 3 4   7. Lifting an object, like a bag of groceries from the floor 1 3 4   8. Performing light activities around your home 2 4 4   9. Performing heavy activities around your home 0 4 3  10. Getting into/out of a car 1 3 3   11. Walking 2 blocks 1 4 3   12. Walking 1 mile 1 3 3   13. Going up/down 10 stairs (1 flight) 1 3 3   14. Standing for 1 hour 1 3 3   15.  sitting for 1 hour 1 3 3   16. Running on even ground 0 2 2  17. Running on uneven ground 0 2 2  18. Making sharp turns while running fast 0 2 2  19. Hopping  0 3 2  20. Rolling over in bed 1 3 3   Score total:  21/80 51/80 62/80      COGNITION:           Overall cognitive status: Within functional limits for tasks assessed                          SENSATION: Light touch: Impaired      POSTURE: weight shift right in seated, R trunk lean   PALPATION:  TTP along L greater trochanter;  mild edema noted into greater trochanteric region; surgical bandage in place no signs of drainage or erythema, bruising around surgical sites   LOWER EXTREMITY ROM:  PROM ROM Right eval Left eval  Hip flexion  120 90 p!  Hip extension 10 0  Hip abduction 30 10   Hip adduction      Hip internal rotation 35 5 p!  Hip external rotation 40 5 p!   (Blank rows = not tested)   LOWER EXTREMITY MMT:    MMT Right 8/21 Left 8/21  Hip  flexion 36.3 35.4  Hip extension    Hip abduction 45.2 45.8  Knee flexion 39.7 35.3  Knee extension 50.1 53.9   (Blank rows = not tested)    GAIT: 8/18- pattern WFL.       TODAY'S TREATMENT:  8/21 Objective testing Discussed progression of HEP & endurance challenges over time  8/18 STM & roller to Lt hip Supine piriformis stretch Seated hip hinge Dead lift 10lb kettle bell Sit<>stand 10lb kettle bell at chest Side squat steps green tband Qped hip ext-blocked by PT Half kneel diagonal chops   8/16  Recumbent bike 5 min L2  PROM L hip STM to L gluteal mm, QL Modified thomas stretch EOB 30s 3x Bridge 2x10 S/l Clams 2x10 S/l Reverse clams 2x10 Prone hip extension x10 low range with tactile cues (unable to complete full range without significant lumbar engagement) LAQ 4# 5 2x15 Partial squats x10      8/12  Recumbent bike 5 min  Supine SKTC 10s 10x LTR 2x10 Modified thomas stretch EOB 30s 3x Bridge 3x8 Prone quad stretch 30s 3x Seated lumbar flexion reaching stretch 10s 10x Seated piriformis stretch 30s 3x  Walking volume management, spikes in activity, rebound pain/effusion    Treatment                            8/9: Blank lines following charge title = not provided on this treatment date.   Manual:  TPDN No IASTM VL, piriformis, glut med There-ex:  There-Act:  Self Care:  Nuro-Re-ed: Adduction + ab set, added bridge with hamstring focus Standing hip hinge- bar along spine for form Dead lift- double and single leg with toe prop, bar to knees Gait Training:    Treatment                            7/24: Blank lines following charge title = not provided on this treatment date.   Manual:  TPDN No STM hip flexors Hip PROM, gentle distraction There-ex: Isometric HS curl over swiss ball + ab set HS curl + mini bridge, ball bw knees Swiss ball roll in/out with small ball bw knees There-Act:  Self Care:  Nuro-Re-ed:  Gait  Training: Without crutch    PATIENT EDUCATION:  Education details: Teacher, music of condition, POC, HEP, exercise form/rationale    Person educated: Patient Education method: Explanation, Demonstration, Tactile cues, Verbal cues, and Handouts Education comprehension: verbalized understanding, returned demonstration, verbal cues required, and tactile cues required     HOME EXERCISE PROGRAM:  Access Code: [email or web address] URL: [email or web address] Date: 10/29/2023 Prepared by: Dale Call   ASSESSMENT:   CLINICAL IMPRESSION: Pt has met her goals at this time and is doing very well. She will be d/c to independent program with understanding that she can reach out at any time with questions.       OBJECTIVE IMPAIRMENTS Abnormal gait, decreased activity tolerance, decreased balance, decreased endurance, decreased mobility, difficulty walking, decreased ROM, decreased strength, hypomobility, increased fascial restrictions, increased muscle spasms, impaired flexibility, improper body mechanics, postural dysfunction, and pain.  ACTIVITY LIMITATIONS carrying, lifting, bending, sitting, standing, squatting, stairs, transfers, and locomotion level   PARTICIPATION LIMITATIONS: cleaning, laundry, interpersonal relationship, driving, shopping, community activity, occupation, and exercise   PERSONAL FACTORS Age, Behavior pattern, Fitness, Past/current experiences, Time since onset of injury/illness/exacerbation, and 1-2 comorbidities:   are also affecting patient's functional outcome.    REHAB POTENTIAL: good      CLINICAL DECISION MAKING: Stable/uncomplicated   EVALUATION COMPLEXITY: Low     GOALS:     SHORT TERM GOALS: Target date: 12/10/2023     Pt will become independent with HEP in order to demonstrate synthesis of PT education.    Goal status: MET   2.  Pt will report at least 2 pt reduction on NPRS scale for pain in order to demonstrate functional improvement with household  activity, self care, and ADL.    Goal status: MET   3.  Pt will be able to demonstrate ability to descend stairs with reciprocal pattern and single UE in order to demonstrate functional improvement in LE function for self-care and house hold duties.    Goal status: MET   4.  Pt will have an at least 9 pt improvement in LEFS measure in order to demonstrate MCID improvement in daily function.  Baseline:  Goal status: MET       LONG TERM GOALS:  01/27/2024    Pt  will become independent with final HEP in order to demonstrate synthesis of PT education. Goal status: INITIAL   2. Pt will have an at least 27 pt improvement in LEFS measure in order to demonstrate MCID improvement in daily function.     Goal status: MET   3.  Pt will be able to demonstrate  full depth DL squat in order to demonstrate functional improvement in L LE strength for return to PLOF and exercise.     Goal status: INITIAL   4.  Pt will be able to demonstrate/report ability to walk >30 mins without pain or need for assistance in order to demonstrate functional improvement and tolerance to exercise and community mobility.   Goal status: MET     PLAN: PT FREQUENCY: 1-2x/week   PT DURATION: 12 wks   PLANNED INTERVENTIONS: Therapeutic exercises, Therapeutic activity, Neuromuscular re-education, Balance training, Gait training, Patient/Family education, Self Care, Joint mobilization, Joint manipulation, Stair training, Orthotic/Fit training, DME instructions, Aquatic Therapy, Dry Needling, Electrical stimulation, Spinal manipulation, Spinal mobilization, Cryotherapy, Moist heat, scar mobilization, Splintting, Taping, Vasopneumatic device, Traction, Ultrasound, Ionotophoresis 4mg /ml Dexamethasone , Manual therapy, and Re-evaluation   PLAN FOR NEXT SESSION: PROM per protocol, gait training   Marvis Saefong C. Dionne Rossa PT, DPT 12/02/23 12:55 PM

## 2023-12-06 ENCOUNTER — Encounter (HOSPITAL_BASED_OUTPATIENT_CLINIC_OR_DEPARTMENT_OTHER): Admitting: Physical Therapy

## 2023-12-06 ENCOUNTER — Ambulatory Visit (HOSPITAL_BASED_OUTPATIENT_CLINIC_OR_DEPARTMENT_OTHER): Admitting: Orthopaedic Surgery

## 2023-12-06 ENCOUNTER — Encounter (HOSPITAL_BASED_OUTPATIENT_CLINIC_OR_DEPARTMENT_OTHER): Payer: Self-pay | Admitting: Orthopaedic Surgery

## 2023-12-06 DIAGNOSIS — M25552 Pain in left hip: Secondary | ICD-10-CM

## 2023-12-06 NOTE — Progress Notes (Signed)
 Post Operative Evaluation    Procedure/Date of Surgery: Left hip arthroscopy labral repair 7/14  Interval History:   Presents 6 weeks status post above procedure.  She is having some difficulty laying directly on the side although overall is doing quite well and making significant improvement   PMH/PSH/Family History/Social History/Meds/Allergies:    Past Medical History:  Diagnosis Date   Acute upper GI bleed 08/02/2019   Allergy    Anxiety    Asthma    Cancer (HCC)    GERD (gastroesophageal reflux disease)    Migraines    Neuromuscular disorder (HCC)    Seizures (HCC)    Last seizure 2014   Sleep apnea    pt denies   Trigeminal neuralgia    Uterine fibroid    Past Surgical History:  Procedure Laterality Date   BREAST ENHANCEMENT SURGERY  2008   BREAST SURGERY     CESAREAN SECTION N/A 06/13/2015   Procedure: CESAREAN SECTION;  Surgeon: Elveria Mungo, MD;  Location: [facility];  Service: Obstetrics;  Laterality: N/A;   COSMETIC SURGERY     ESOPHAGOGASTRODUODENOSCOPY (EGD) WITH PROPOFOL  N/A 08/30/2019   Procedure: ESOPHAGOGASTRODUODENOSCOPY (EGD) WITH PROPOFOL ;  Surgeon: Janalyn Keene NOVAK, MD;  Location: [facility];  Service: Endoscopy;  Laterality: N/A;   wisdom teeth     Social History   Socioeconomic History   Marital status: Significant Other    Spouse name: Not on file   Number of children: Not on file   Years of education: Not on file   Highest education level: Not on file  Occupational History   Not on file  Tobacco Use   Smoking status: Never   Smokeless tobacco: Former  Advertising account planner   Vaping status: Never Used  Substance and Sexual Activity   Alcohol use: Yes    Comment: socially   Drug use: No   Sexual activity: Yes    Partners: Male    Birth control/protection: Pill  Other Topics Concern   Not on file  Social History Narrative   Not on file   Social Drivers of Health   Financial Resource Strain:  Not on file  Food Insecurity: Not on file  Transportation Needs: Not on file  Physical Activity: Not on file  Stress: Not on file  Social Connections: Not on file   Family History  Problem Relation Age of Onset   Hyperlipidemia Mother    Arthritis Mother    Depression Mother    Arthritis Father    Depression Father    Alcohol abuse Father    Allergies  Allergen Reactions   Egg-Derived Products Rash   Erythromycin Rash   Paroxetine Palpitations   Sulfonamide Derivatives Rash   Current Outpatient Medications  Medication Sig Dispense Refill   cetirizine  (ZYRTEC  ALLERGY) 10 MG tablet Take 1 tablet (10 mg total) by mouth at bedtime. 90 tablet 1   EPINEPHrine  0.3 mg/0.3 mL IJ SOAJ injection Inject 0.3 mg into the muscle as needed for anaphylaxis. 1 each 1   famotidine  (PEPCID ) 20 MG tablet Take 1 tablet (20 mg total) by mouth 2 (two) times daily. 60 tablet 0   fluticasone  (FLONASE ) 50 MCG/ACT nasal spray Place 2 sprays into both nostrils daily. 16 g 0   hydrOXYzine  (ATARAX /VISTARIL ) 25 MG tablet TAKE ONE TABLET BY MOUTH EVERY 6  HOURS AS NEEDED FOR ITCHING 30 tablet 1   Lasmiditan  Succinate (REYVOW ) 100 MG TABS Take 1 tablet (100 mg total) by mouth as needed. 8 tablet 3   norethindrone  (CAMILA ) 0.35 MG tablet Take 1 tablet (0.35 mg total) by mouth daily. 90 tablet 3   omalizumab (XOLAIR) 150 MG/ML prefilled syringe Inject into the skin every 14 (fourteen) days.     tiZANidine  (ZANAFLEX ) 4 MG tablet Take 1 tablet (4 mg total) by mouth at bedtime as needed for muscle spasms. 30 tablet 5   topiramate  (TOPAMAX ) 200 MG tablet Take 1 tablet (200 mg total) by mouth daily. 90 tablet 3   Ubrogepant  (UBRELVY ) 100 MG TABS Take 1 tablet (100 mg total) by mouth as needed (migraine). 48 tablet 1   No current facility-administered medications for this visit.   No results found.  Review of Systems:   A ROS was performed including pertinent positives and negatives as documented in the  HPI.   Musculoskeletal Exam:     Hip incisions are well-appearing without erythema or drainage.  Internal/external rotation to 30 degrees.  Good abduction strength.  Distal neurosensory exam is intact  Imaging:      I personally reviewed and interpreted the radiographs.   Assessment:   6 Weeks status post left hip arthroscopic labral pair doing extremely well.  Overall she is doing quite well.  She will continue to work on range of motion strengthening I will plan to see her back as needed  Plan :    - Return to clinic as needed      I personally saw and evaluated the patient, and participated in the management and treatment plan.  Elspeth Parker, MD Attending Physician, Orthopedic Surgery  This document was dictated using Dragon voice recognition software. A reasonable attempt at proof reading has been made to minimize errors.

## 2023-12-08 ENCOUNTER — Encounter (HOSPITAL_BASED_OUTPATIENT_CLINIC_OR_DEPARTMENT_OTHER): Payer: Self-pay | Admitting: Orthopaedic Surgery

## 2023-12-09 ENCOUNTER — Encounter (HOSPITAL_BASED_OUTPATIENT_CLINIC_OR_DEPARTMENT_OTHER): Admitting: Physical Therapy

## 2023-12-18 ENCOUNTER — Encounter (HOSPITAL_BASED_OUTPATIENT_CLINIC_OR_DEPARTMENT_OTHER): Admitting: Physical Therapy

## 2023-12-20 ENCOUNTER — Other Ambulatory Visit: Payer: Self-pay | Admitting: *Deleted

## 2023-12-20 MED ORDER — NURTEC 75 MG PO TBDP: 75.0000 mg | ORAL_TABLET | ORAL | 2 refills | Status: AC | PRN

## 2023-12-22 ENCOUNTER — Telehealth: Payer: Self-pay | Admitting: *Deleted

## 2023-12-22 NOTE — Telephone Encounter (Signed)
 Approved. NURTEC 75MG  Tablet Disintegrating is approved from 12/21/2023 to 06/19/2024. All strengths of the drug are approved.

## 2023-12-22 NOTE — Telephone Encounter (Signed)
 Received Denial for Reyvow 

## 2023-12-29 ENCOUNTER — Telehealth: Admitting: Physician Assistant

## 2023-12-29 DIAGNOSIS — J069 Acute upper respiratory infection, unspecified: Secondary | ICD-10-CM | POA: Diagnosis not present

## 2023-12-29 DIAGNOSIS — R3989 Other symptoms and signs involving the genitourinary system: Secondary | ICD-10-CM

## 2023-12-29 DIAGNOSIS — T3695XA Adverse effect of unspecified systemic antibiotic, initial encounter: Secondary | ICD-10-CM | POA: Diagnosis not present

## 2023-12-29 DIAGNOSIS — B379 Candidiasis, unspecified: Secondary | ICD-10-CM

## 2023-12-29 MED ORDER — FLUCONAZOLE 150 MG PO TABS: 150.0000 mg | ORAL_TABLET | ORAL | 0 refills | Status: AC | PRN

## 2023-12-29 MED ORDER — PSEUDOEPH-BROMPHEN-DM 30-2-10 MG/5ML PO SYRP: 5.0000 mL | ORAL_SOLUTION | Freq: Four times a day (QID) | ORAL | 0 refills | Status: AC | PRN

## 2023-12-29 MED ORDER — AMOXICILLIN-POT CLAVULANATE 875-125 MG PO TABS: 1.0000 | ORAL_TABLET | Freq: Two times a day (BID) | ORAL | 0 refills | Status: AC

## 2023-12-29 MED ORDER — FLUTICASONE PROPIONATE 50 MCG/ACT NA SUSP: 2.0000 | Freq: Every day | NASAL | 0 refills | Status: AC

## 2023-12-29 NOTE — Progress Notes (Signed)
 Virtual Visit Consent   Sharon King, you are scheduled for a virtual visit with a [facility] provider today. Just as with appointments in the office, your consent must be obtained to participate. Your consent will be active for this visit and any virtual visit you may have with one of our providers in the next 365 days. If you have a MyChart account, a copy of this consent can be sent to you electronically.  As this is a virtual visit, video technology does not allow for your provider to perform a traditional examination. This may limit your provider's ability to fully assess your condition. If your provider identifies any concerns that need to be evaluated in person or the need to arrange testing (such as labs, EKG, etc.), we will make arrangements to do so. Although advances in technology are sophisticated, we cannot ensure that it will always work on either your end or our end. If the connection with a video visit is poor, the visit may have to be switched to a telephone visit. With either a video or telephone visit, we are not always able to ensure that we have a secure connection.  By engaging in this virtual visit, you consent to the provision of healthcare and authorize for your insurance to be billed (if applicable) for the services provided during this visit. Depending on your insurance coverage, you may receive a charge related to this service.  I need to obtain your verbal consent now. Are you willing to proceed with your visit today? Gennie Eisinger has provided verbal consent on 12/29/2023 for a virtual visit (video or telephone). Delon CHRISTELLA Dickinson, PA-C  Date: 12/29/2023 1:02 PM   Virtual Visit via Video Note   I, Delon CHRISTELLA Dickinson, connected with  Levana Minetti  ([id number], 1985/05/31) on 12/29/23 at 12:30 PM EDT by a video-enabled telemedicine application and verified that I am speaking with the correct person using two identifiers.  Location: Patient:  Virtual Visit Location Patient: Home Provider: Virtual Visit Location Provider: Home Office   I discussed the limitations of evaluation and management by telemedicine and the availability of in person appointments. The patient expressed understanding and agreed to proceed.    History of Present Illness: Sharon King is a 38 y.o. who identifies as a female who was assigned female at birth, and is being seen today for URI and UTI symptoms.  HPI: URI  This is a new problem. The current episode started today. The problem has been gradually worsening. The maximum temperature recorded prior to her arrival was 101 - 101.9 F (101). The fever has been present for Less than 1 day. Associated symptoms include congestion, coughing, headaches, a plugged ear sensation (right is muffled), sinus pain and a sore throat. Pertinent negatives include no diarrhea, ear pain, nausea, rhinorrhea, vomiting or wheezing. Associated symptoms comments: Fatigue, post nasal drainage, hoarse voice this morning. She has tried acetaminophen  for the symptoms. The treatment provided mild relief.  Urinary Tract Infection  This is a new problem. The current episode started in the past 7 days. The problem occurs every urination. The problem has been gradually worsening. The quality of the pain is described as burning. The pain is mild. The maximum temperature recorded prior to her arrival was 101 - 101.9 F (101 today with URI symptom onset). There is A history of pyelonephritis (once). Associated symptoms include chills, frequency, hesitancy (improving some) and urgency. Pertinent negatives include no discharge, flank pain, hematuria, nausea or vomiting.  She has tried increased fluids and acetaminophen  (water and cranberry juice) for the symptoms. The treatment provided mild relief.     Problems:  Patient Active Problem List   Diagnosis Date Noted   Tear of left acetabular labrum 10/25/2023   Right ovarian cyst 09/30/2023    Adjustment disorder with mixed anxiety and depressed mood 01/20/2022   Urticaria 12/22/2019   Chronic migraine without aura 06/23/2019   Muscle spasm 02/18/2016   Allergic rhinitis 04/22/2007   Asthma 11/06/2006   Migraines 11/06/2006    Allergies:  Allergies  Allergen Reactions   Egg-Derived Products Rash   Erythromycin Rash   Paroxetine Palpitations   Sulfonamide Derivatives Rash   Medications:  Current Outpatient Medications:    amoxicillin -clavulanate (AUGMENTIN ) 875-125 MG tablet, Take 1 tablet by mouth 2 (two) times daily., Disp: 14 tablet, Rfl: 0   brompheniramine-pseudoephedrine-DM 30-2-10 MG/5ML syrup, Take 5 mLs by mouth 4 (four) times daily as needed., Disp: 120 mL, Rfl: 0   fluconazole  (DIFLUCAN ) 150 MG tablet, Take 1 tablet (150 mg total) by mouth every 3 (three) days as needed., Disp: 2 tablet, Rfl: 0   fluticasone  (FLONASE ) 50 MCG/ACT nasal spray, Place 2 sprays into both nostrils daily., Disp: 16 g, Rfl: 0   Rimegepant Sulfate (NURTEC) 75 MG TBDP, Take 1 tablet (75 mg total) by mouth as needed (migraine)., Disp: 16 tablet, Rfl: 2   cetirizine  (ZYRTEC  ALLERGY) 10 MG tablet, Take 1 tablet (10 mg total) by mouth at bedtime., Disp: 90 tablet, Rfl: 1   EPINEPHrine  0.3 mg/0.3 mL IJ SOAJ injection, Inject 0.3 mg into the muscle as needed for anaphylaxis., Disp: 1 each, Rfl: 1   famotidine  (PEPCID ) 20 MG tablet, Take 1 tablet (20 mg total) by mouth 2 (two) times daily., Disp: 60 tablet, Rfl: 0   hydrOXYzine  (ATARAX /VISTARIL ) 25 MG tablet, TAKE ONE TABLET BY MOUTH EVERY 6 HOURS AS NEEDED FOR ITCHING, Disp: 30 tablet, Rfl: 1   Lasmiditan  Succinate (REYVOW ) 100 MG TABS, Take 1 tablet (100 mg total) by mouth as needed., Disp: 8 tablet, Rfl: 3   norethindrone  (CAMILA ) 0.35 MG tablet, Take 1 tablet (0.35 mg total) by mouth daily., Disp: 90 tablet, Rfl: 3   omalizumab (XOLAIR) 150 MG/ML prefilled syringe, Inject into the skin every 14 (fourteen) days., Disp: , Rfl:    tiZANidine   (ZANAFLEX ) 4 MG tablet, Take 1 tablet (4 mg total) by mouth at bedtime as needed for muscle spasms., Disp: 30 tablet, Rfl: 5   topiramate  (TOPAMAX ) 200 MG tablet, Take 1 tablet (200 mg total) by mouth daily., Disp: 90 tablet, Rfl: 3   Ubrogepant  (UBRELVY ) 100 MG TABS, Take 1 tablet (100 mg total) by mouth as needed (migraine)., Disp: 48 tablet, Rfl: 1  Observations/Objective: Patient is well-developed, well-nourished in no acute distress.  Resting comfortably at home.  Head is normocephalic, atraumatic.  No labored breathing.  Speech is clear and coherent with logical content.  Patient is alert and oriented at baseline.    Assessment and Plan: 1. Viral URI with cough (Primary) - brompheniramine-pseudoephedrine-DM 30-2-10 MG/5ML syrup; Take 5 mLs by mouth 4 (four) times daily as needed.  Dispense: 120 mL; Refill: 0 - fluticasone  (FLONASE ) 50 MCG/ACT nasal spray; Place 2 sprays into both nostrils daily.  Dispense: 16 g; Refill: 0  2. Suspected UTI - amoxicillin -clavulanate (AUGMENTIN ) 875-125 MG tablet; Take 1 tablet by mouth 2 (two) times daily.  Dispense: 14 tablet; Refill: 0  3. Antibiotic-induced yeast infection - fluconazole  (DIFLUCAN ) 150 MG tablet; Take  1 tablet (150 mg total) by mouth every 3 (three) days as needed.  Dispense: 2 tablet; Refill: 0  - Worsening urinary symptoms.  - Will treat empirically with Augmentin  - May use AZO for bladder spasms - Diflucan  given as prophylaxis as patient tends to get vaginal yeast infections with antibiotic use  - Suspect viral URI, advised to test for Covid - Symptomatic medications of choice over the counter as needed - Added Bromfed DM and Flonase  for symptomatic management of cough and congestion  - Push fluids - Rest  - Seek further evaluation if symptoms change or worsen    Follow Up Instructions: I discussed the assessment and treatment plan with the patient. The patient was provided an opportunity to ask questions and all  were answered. The patient agreed with the plan and demonstrated an understanding of the instructions.  A copy of instructions were sent to the patient via MyChart unless otherwise noted below.    The patient was advised to call back or seek an in-person evaluation if the symptoms worsen or if the condition fails to improve as anticipated.    Delon CHRISTELLA Dickinson, PA-C

## 2023-12-29 NOTE — Patient Instructions (Signed)
 Sharon King, thank you for joining Delon CHRISTELLA Dickinson, PA-C for today's virtual visit.  While this provider is not your primary care provider (PCP), if your PCP is located in our provider database this encounter information will be shared with them immediately following your visit.   A [facility] MyChart account gives you access to today's visit and all your visits, tests, and labs performed at [facility]  click here if you don't have a [facility] MyChart account or go to [email or web address]  Consent: (Patient) Sharon King provided verbal consent for this virtual visit at the beginning of the encounter.  Current Medications:  Current Outpatient Medications:    amoxicillin -clavulanate (AUGMENTIN ) 875-125 MG tablet, Take 1 tablet by mouth 2 (two) times daily., Disp: 14 tablet, Rfl: 0   brompheniramine-pseudoephedrine-DM 30-2-10 MG/5ML syrup, Take 5 mLs by mouth 4 (four) times daily as needed., Disp: 120 mL, Rfl: 0   fluconazole  (DIFLUCAN ) 150 MG tablet, Take 1 tablet (150 mg total) by mouth every 3 (three) days as needed., Disp: 2 tablet, Rfl: 0   fluticasone  (FLONASE ) 50 MCG/ACT nasal spray, Place 2 sprays into both nostrils daily., Disp: 16 g, Rfl: 0   Rimegepant Sulfate (NURTEC) 75 MG TBDP, Take 1 tablet (75 mg total) by mouth as needed (migraine)., Disp: 16 tablet, Rfl: 2   cetirizine  (ZYRTEC  ALLERGY) 10 MG tablet, Take 1 tablet (10 mg total) by mouth at bedtime., Disp: 90 tablet, Rfl: 1   EPINEPHrine  0.3 mg/0.3 mL IJ SOAJ injection, Inject 0.3 mg into the muscle as needed for anaphylaxis., Disp: 1 each, Rfl: 1   famotidine  (PEPCID ) 20 MG tablet, Take 1 tablet (20 mg total) by mouth 2 (two) times daily., Disp: 60 tablet, Rfl: 0   hydrOXYzine  (ATARAX /VISTARIL ) 25 MG tablet, TAKE ONE TABLET BY MOUTH EVERY 6 HOURS AS NEEDED FOR ITCHING, Disp: 30 tablet, Rfl: 1   Lasmiditan  Succinate (REYVOW ) 100 MG TABS, Take 1 tablet (100 mg total) by mouth as needed.,  Disp: 8 tablet, Rfl: 3   norethindrone  (CAMILA ) 0.35 MG tablet, Take 1 tablet (0.35 mg total) by mouth daily., Disp: 90 tablet, Rfl: 3   omalizumab (XOLAIR) 150 MG/ML prefilled syringe, Inject into the skin every 14 (fourteen) days., Disp: , Rfl:    tiZANidine  (ZANAFLEX ) 4 MG tablet, Take 1 tablet (4 mg total) by mouth at bedtime as needed for muscle spasms., Disp: 30 tablet, Rfl: 5   topiramate  (TOPAMAX ) 200 MG tablet, Take 1 tablet (200 mg total) by mouth daily., Disp: 90 tablet, Rfl: 3   Ubrogepant  (UBRELVY ) 100 MG TABS, Take 1 tablet (100 mg total) by mouth as needed (migraine)., Disp: 48 tablet, Rfl: 1   Medications ordered in this encounter:  Meds ordered this encounter  Medications   amoxicillin -clavulanate (AUGMENTIN ) 875-125 MG tablet    Sig: Take 1 tablet by mouth 2 (two) times daily.    Dispense:  14 tablet    Refill:  0    Supervising Provider:   LAMPTEY, PHILIP O [[id number]]   brompheniramine-pseudoephedrine-DM 30-2-10 MG/5ML syrup    Sig: Take 5 mLs by mouth 4 (four) times daily as needed.    Dispense:  120 mL    Refill:  0    Supervising Provider:   LAMPTEY, PHILIP O [id number]   fluticasone  (FLONASE ) 50 MCG/ACT nasal spray    Sig: Place 2 sprays into both nostrils daily.    Dispense:  16 g    Refill:  0    Supervising Provider:  LAMPTEY, PHILIP O [[id number]]   fluconazole  (DIFLUCAN ) 150 MG tablet    Sig: Take 1 tablet (150 mg total) by mouth every 3 (three) days as needed.    Dispense:  2 tablet    Refill:  0    Supervising Provider:   LAMPTEY, PHILIP O [[id number]]     *If you need refills on other medications prior to your next appointment, please contact your pharmacy*  Follow-Up: Call back or seek an in-person evaluation if the symptoms worsen or if the condition fails to improve as anticipated.  [facility] [phone]  Other Instructions  Viral Respiratory Infection A respiratory infection is an illness that affects part of the respiratory  system, such as the lungs, nose, or throat. A respiratory infection that is caused by a virus is called a viral respiratory infection. Common types of viral respiratory infections include: A cold. The flu (influenza). A respiratory syncytial virus (RSV) infection. What are the causes? This condition is caused by a virus. The virus may spread through contact with droplets or direct contact with infected people or their mucus or secretions. The virus may spread from person to person (is contagious). What are the signs or symptoms? Symptoms of this condition include: A stuffy or runny nose. A sore throat or cough. Shortness of breath or difficulty breathing. Yellow or green mucus (sputum). Other symptoms may include: A fever. Sweating or chills. Fatigue. Achy muscles. A headache. How is this diagnosed? This condition may be diagnosed based on: Your symptoms. A physical exam. Testing of secretions from the nose or throat. Chest X-ray. How is this treated? This condition may be treated with medicines, such as: Antiviral medicine. This may shorten the length of time a person has symptoms. Expectorants. These make it easier to cough up mucus. Decongestant nasal sprays. Acetaminophen  or NSAIDs, such as ibuprofen , to relieve fever and pain. Antibiotic medicines are not prescribed for viral infections.This is because antibiotics are designed to kill bacteria. They do not kill viruses. Follow these instructions at home: Managing pain and congestion Take over-the-counter and prescription medicines only as told by your health care provider. If you have a sore throat, gargle with a mixture of salt and water 3-4 times a day or as needed. To make salt water, completely dissolve -1 tsp (3-6 g) of salt in 1 cup (237 mL) of warm water. Use nose drops made from salt water to ease congestion and soften raw skin around your nose. Take 2 tsp (10 mL) of honey at bedtime to lessen coughing at night. Do  not give honey to children who are younger than 1 year. Drink enough fluid to keep your urine pale yellow. This helps prevent dehydration and helps loosen up mucus. General instructions  Rest as much as possible. Do not drink alcohol. Do not use any products that contain nicotine or tobacco. These products include cigarettes, chewing tobacco, and vaping devices, such as e-cigarettes. If you need help quitting, ask your health care provider. Keep all follow-up visits. This is important. How is this prevented?     Get an annual flu shot. You may get the flu shot in late summer, fall, or winter. Ask your health care provider when you should get your flu shot. Avoid spreading your infection to other people. If you are sick: Wash your hands with soap and water often, especially after you cough or sneeze. Wash for at least 20 seconds. If soap and water are not available, use alcohol-based hand sanitizer.  Cover your mouth when you cough. Cover your nose and mouth when you sneeze. Do not share cups or eating utensils. Clean commonly used objects often. Clean commonly touched surfaces. Stay home from work or school as told by your health care provider. Avoid contact with people who are sick during cold and flu season. This is generally fall and winter. Contact a health care provider if: Your symptoms last for 10 days or longer. Your symptoms get worse over time. You have severe sinus pain in your face or forehead. The glands in your jaw or neck become very swollen. You have shortness of breath. Get help right away if you: Feel pain or pressure in your chest. Have trouble breathing. Faint or feel like you will faint. Have severe and persistent vomiting. Feel confused or disoriented. These symptoms may represent a serious problem that is an emergency. Do not wait to see if the symptoms will go away. Get medical help right away. Call your local emergency services (911 in the [place] Do not drive  yourself to the hospital. Summary A respiratory infection is an illness that affects part of the respiratory system, such as the lungs, nose, or throat. A respiratory infection that is caused by a virus is called a viral respiratory infection. Common types of viral respiratory infections include a cold, influenza, and respiratory syncytial virus (RSV) infection. Symptoms of this condition include a stuffy or runny nose, cough, fatigue, achy muscles, sore throat, and fevers or chills. Antibiotic medicines are not prescribed for viral infections. This is because antibiotics are designed to kill bacteria. They are not effective against viruses. This information is not intended to replace advice given to you by your health care provider. Make sure you discuss any questions you have with your health care provider. Document Revised: 07/04/2020 Document Reviewed: 07/04/2020 Elsevier Patient Education  2024 [employer]   If you have been instructed to have an in-person evaluation today at a local Urgent Care facility, please use the link below. It will take you to a list of all of our available [facility], including address, phone number and hours of operation. Please do not delay care.  [facility]  If you or a family member do not have a primary care provider, use the link below to schedule a visit and establish care. When you choose a [facility] primary care physician or advanced practice provider, you gain a long-term partner in health. Find a Primary Care Provider  Learn more about [facility]'s in-office and virtual care options: [facility] - Get Care Now

## 2024-01-03 ENCOUNTER — Ambulatory Visit (HOSPITAL_BASED_OUTPATIENT_CLINIC_OR_DEPARTMENT_OTHER)
Admission: RE | Admit: 2024-01-03 | Discharge: 2024-01-03 | Disposition: A | Discharge status: Home / Self Care | Source: Ambulatory Visit | Attending: Family Medicine | Admitting: Family Medicine

## 2024-01-03 ENCOUNTER — Encounter (HOSPITAL_BASED_OUTPATIENT_CLINIC_OR_DEPARTMENT_OTHER): Payer: Self-pay

## 2024-01-03 VITALS — BP 136/98 | HR 80 | Temp 98.6°F | Resp 20

## 2024-01-03 DIAGNOSIS — R0981 Nasal congestion: Secondary | ICD-10-CM | POA: Diagnosis not present

## 2024-01-03 LAB — POC COVID19/FLU A&B COMBO
Covid Antigen, POC: NEGATIVE
Influenza A Antigen, POC: NEGATIVE
Influenza B Antigen, POC: NEGATIVE

## 2024-01-03 NOTE — ED Provider Notes (Signed)
 [name]    CSN: [id number] Arrival date & time: 01/03/24  0954      History   Chief Complaint Chief Complaint  Patient presents with   Follow-up    Have been on antibiotic for 5 days no change was told to come back. Headache, nasal, cough, fever, ear pain body aches - Entered by patient   Nasal Congestion    HPI Sharon King is a 38 y.o. female.   Patient is a 38 year old female who presents today with upper respiratory symptoms.  Pt states she had a telehealth visit last Wednesday and was given abx for a urinary tract infection, Augmentin .  Her urinary symptoms have resolved but she continues to still have upper respiratory symptoms.  She is still taking the amoxicillin .  She has taken home covid test and has been neg three times. She has been having possible fever, body aches, nasal congestion-worse morning and night, facial pain, HA, bilateral ear pain, sore throat, and cough-dry. Pt denies chills or chest tightness. She has also been taking tylenol  and advil  regularly with some relief.       Past Medical History:  Diagnosis Date   Acute upper GI bleed 08/02/2019   Allergy    Anxiety    Asthma    Cancer (HCC)    GERD (gastroesophageal reflux disease)    Migraines    Neuromuscular disorder (HCC)    Seizures (HCC)    Last seizure 2014   Sleep apnea    pt denies   Trigeminal neuralgia    Uterine fibroid     Patient Active Problem List   Diagnosis Date Noted   Tear of left acetabular labrum 10/25/2023   Right ovarian cyst 09/30/2023   Adjustment disorder with mixed anxiety and depressed mood 01/20/2022   Urticaria 12/22/2019   Chronic migraine without aura 06/23/2019   Muscle spasm 02/18/2016   Allergic rhinitis 04/22/2007   Asthma 11/06/2006   Migraines 11/06/2006    Past Surgical History:  Procedure Laterality Date   BREAST ENHANCEMENT SURGERY  2008   BREAST SURGERY     CESAREAN SECTION N/A 06/13/2015   Procedure: CESAREAN  SECTION;  Surgeon: Elveria Mungo, MD;  Location: [facility];  Service: Obstetrics;  Laterality: N/A;   COSMETIC SURGERY     ESOPHAGOGASTRODUODENOSCOPY (EGD) WITH PROPOFOL  N/A 08/30/2019   Procedure: ESOPHAGOGASTRODUODENOSCOPY (EGD) WITH PROPOFOL ;  Surgeon: Janalyn Keene NOVAK, MD;  Location: [facility];  Service: Endoscopy;  Laterality: N/A;   HIP ARTHROSCOPY W/ LABRAL REPAIR Left 10/25/2023   wisdom teeth      OB History     Gravida  1   Para  1   Term      Preterm  1   AB      Living  1      SAB      IAB      Ectopic      Multiple  0   Live Births  1            Home Medications    Prior to Admission medications   Medication Sig Start Date End Date Taking? Authorizing Provider  Rimegepant Sulfate (NURTEC) 75 MG TBDP Take 1 tablet (75 mg total) by mouth as needed (migraine). 12/20/23   Nance Gaskins, Darice BRAVO, PA-C  amoxicillin -clavulanate (AUGMENTIN ) 875-125 MG tablet Take 1 tablet by mouth 2 (two) times daily. 12/29/23   Vivienne Delon HERO, PA-C  brompheniramine-pseudoephedrine-DM 30-2-10 MG/5ML syrup Take 5 mLs by mouth 4 (  four) times daily as needed. 12/29/23   Vivienne Delon HERO, PA-C  cetirizine  (ZYRTEC  ALLERGY) 10 MG tablet Take 1 tablet (10 mg total) by mouth at bedtime. 04/10/22 10/25/23  Joesph Shaver Scales, PA-C  EPINEPHrine  0.3 mg/0.3 mL IJ SOAJ injection Inject 0.3 mg into the muscle as needed for anaphylaxis. 12/26/19   Ashok Kathrine HERO, PA-C  famotidine  (PEPCID ) 20 MG tablet Take 1 tablet (20 mg total) by mouth 2 (two) times daily. 12/26/19 10/25/23  Ashok Kathrine HERO, PA-C  fluconazole  (DIFLUCAN ) 150 MG tablet Take 1 tablet (150 mg total) by mouth every 3 (three) days as needed. 12/29/23   Vivienne Delon HERO, PA-C  fluticasone  (FLONASE ) 50 MCG/ACT nasal spray Place 2 sprays into both nostrils daily. 12/29/23   Vivienne Delon HERO, PA-C  hydrOXYzine  (ATARAX /VISTARIL ) 25 MG tablet TAKE ONE TABLET BY MOUTH EVERY 6 HOURS AS NEEDED FOR ITCHING  06/13/20   Ashok Kathrine M, PA-C  Lasmiditan  Succinate (REYVOW ) 100 MG TABS Take 1 tablet (100 mg total) by mouth as needed. 11/26/23   Nance Gaskins, Darice BRAVO, PA-C  norethindrone  (CAMILA ) 0.35 MG tablet Take 1 tablet (0.35 mg total) by mouth daily. 05/21/23   Izell Harari, MD  omalizumab CIPRIANO) 150 MG/ML prefilled syringe Inject into the skin every 14 (fourteen) days.    [provider]  tiZANidine  (ZANAFLEX ) 4 MG tablet Take 1 tablet (4 mg total) by mouth at bedtime as needed for muscle spasms. 11/26/23   Nance Gaskins, Darice BRAVO, PA-C  topiramate  (TOPAMAX ) 200 MG tablet Take 1 tablet (200 mg total) by mouth daily. 11/26/23   Nance Gaskins, Darice BRAVO, PA-C  Ubrogepant  (UBRELVY ) 100 MG TABS Take 1 tablet (100 mg total) by mouth as needed (migraine). 10/09/22   Nance Gaskins, Darice BRAVO, PA-C    Family History Family History  Problem Relation Age of Onset   Hyperlipidemia Mother    Arthritis Mother    Depression Mother    Arthritis Father    Depression Father    Alcohol abuse Father     Social History Social History   Tobacco Use   Smoking status: Never   Smokeless tobacco: Former  Building services engineer status: Never Used  Substance Use Topics   Alcohol use: Yes    Comment: socially   Drug use: No     Allergies   Egg-derived products, Erythromycin, Paroxetine, and Sulfonamide derivatives   Review of Systems Review of Systems  See HPI Physical Exam Triage Vital Signs ED Triage Vitals  Encounter Vitals Group     BP 01/03/24 1023 (!) 136/98     Girls Systolic BP Percentile --      Girls Diastolic BP Percentile --      Boys Systolic BP Percentile --      Boys Diastolic BP Percentile --      Pulse Rate 01/03/24 1023 80     Resp 01/03/24 1023 20     Temp 01/03/24 1023 98.6 F (37 C)     Temp Source 01/03/24 1023 Oral     SpO2 01/03/24 1023 97 %     Weight --      Height --      Head Circumference --      Peak Flow --      Pain Score 01/03/24 1020 4     Pain Loc --       Pain Education --      Exclude from Growth Chart --    No data found.  Updated Vital Signs BP (!) 136/98 (BP Location: Right Arm)   Pulse 80   Temp 98.6 F (37 C) (Oral)   Resp 20   LMP 12/13/2023 (Exact Date)   SpO2 97%   Visual Acuity Right Eye Distance:   Left Eye Distance:   Bilateral Distance:    Right Eye Near:   Left Eye Near:    Bilateral Near:     Physical Exam Constitutional:      General: She is not in acute distress.    Appearance: Normal appearance. She is not ill-appearing, toxic-appearing or diaphoretic.  HENT:     Head: Normocephalic and atraumatic.     Right Ear: Tympanic membrane and ear canal normal.     Left Ear: Tympanic membrane and ear canal normal.     Nose: Congestion and rhinorrhea present.     Mouth/Throat:     Pharynx: Oropharynx is clear.  Eyes:     Conjunctiva/sclera: Conjunctivae normal.  Cardiovascular:     Rate and Rhythm: Normal rate and regular rhythm.     Pulses: Normal pulses.     Heart sounds: Normal heart sounds.  Pulmonary:     Effort: Pulmonary effort is normal.     Breath sounds: Normal breath sounds.  Skin:    General: Skin is warm and dry.  Neurological:     Mental Status: She is alert.  Psychiatric:        Mood and Affect: Mood normal.      UC Treatments / Results  Labs (all labs ordered are listed, but only abnormal results are displayed) Labs Reviewed  POC COVID19/FLU A&B COMBO - Normal    EKG   Radiology No results found.  Procedures Procedures (including critical care time)  Medications Ordered in UC Medications - No data to display  Initial Impression / Assessment and Plan / UC Course  I have reviewed the triage vital signs and the nursing notes.  Pertinent labs & imaging results that were available during my care of the patient were reviewed by me and considered in my medical decision making (see chart for details).     Nasal congestion-COVID and flu testing negative today.  Most  likely some sort of viral illness.  Recommend over-the-counter Mucinex, Sudafed for symptoms.  Can continue with nasal spray and allergy medication.  Rest, hydrate and follow-up as needed. Final Clinical Impressions(s) / UC Diagnoses   Final diagnoses:  Nasal congestion     Discharge Instructions      Your COVID/FLU test is negative.  Recommend over-the-counter medications like Sudafed, Mucinex for your symptoms.  Can continue with the nasal spray and allergy medication. Rest, hydrate and follow-up as needed    ED Prescriptions   None    PDMP not reviewed this encounter.   Adah Wilbert LABOR, FNP 01/03/24 1407

## 2024-01-03 NOTE — Discharge Instructions (Signed)
 Your COVID/FLU test is negative.  Recommend over-the-counter medications like Sudafed, Mucinex for your symptoms.  Can continue with the nasal spray and allergy medication. Rest, hydrate and follow-up as needed

## 2024-01-03 NOTE — ED Triage Notes (Signed)
 Pt states she had a telehealth visit last Wednesday and was given abx but she is still having symptoms. She has taken home covid test and has been neg three times. She has been having possible fever, body aches, nasal congestion-worse morning and night, facial pain, HA, bilateral ear pain, sore throat, and cough-dry. Pt denies chills or chest tightness. She has also been taking tylenol  and advil  regularly with some relief.

## 2024-01-26 ENCOUNTER — Encounter: Payer: Self-pay | Admitting: *Deleted

## 2024-02-03 ENCOUNTER — Encounter (HOSPITAL_BASED_OUTPATIENT_CLINIC_OR_DEPARTMENT_OTHER): Payer: Self-pay

## 2024-02-03 ENCOUNTER — Encounter

## 2024-02-03 ENCOUNTER — Telehealth: Admitting: Family Medicine

## 2024-02-03 ENCOUNTER — Ambulatory Visit (HOSPITAL_BASED_OUTPATIENT_CLINIC_OR_DEPARTMENT_OTHER)
Admission: RE | Admit: 2024-02-03 | Discharge: 2024-02-03 | Disposition: A | Discharge status: Home / Self Care | Source: Ambulatory Visit | Attending: Family Medicine | Admitting: Family Medicine

## 2024-02-03 VITALS — BP 159/101 | HR 81 | Temp 98.3°F | Resp 20

## 2024-02-03 DIAGNOSIS — F526 Dyspareunia not due to a substance or known physiological condition: Secondary | ICD-10-CM

## 2024-02-03 DIAGNOSIS — R1030 Lower abdominal pain, unspecified: Secondary | ICD-10-CM

## 2024-02-03 DIAGNOSIS — R109 Unspecified abdominal pain: Secondary | ICD-10-CM

## 2024-02-03 LAB — POCT URINE DIPSTICK
Bilirubin, UA: NEGATIVE
Blood, UA: NEGATIVE
Glucose, UA: NEGATIVE mg/dL
Ketones, POC UA: NEGATIVE mg/dL
Nitrite, UA: NEGATIVE
Protein Ur, POC: NEGATIVE mg/dL
Spec Grav, UA: 1.01 (ref 1.010–1.025)
Urobilinogen, UA: 0.2 U/dL
pH, UA: 5.5 (ref 5.0–8.0)

## 2024-02-03 MED ORDER — ONDANSETRON 4 MG PO TBDP
4.0000 mg | ORAL_TABLET | Freq: Once | ORAL | Status: AC
  Administered 2024-02-03: 4 mg via ORAL

## 2024-02-03 MED ORDER — TRAMADOL HCL 50 MG PO TABS: 50.0000 mg | ORAL_TABLET | Freq: Two times a day (BID) | ORAL | 0 refills | Status: AC | PRN

## 2024-02-03 MED ORDER — KETOROLAC TROMETHAMINE 30 MG/ML IJ SOLN
30.0000 mg | Freq: Once | INTRAMUSCULAR | Status: AC
  Administered 2024-02-03: 30 mg via INTRAMUSCULAR

## 2024-02-03 MED ORDER — ONDANSETRON 4 MG PO TBDP: 4.0000 mg | ORAL_TABLET | Freq: Three times a day (TID) | ORAL | 0 refills | Status: AC | PRN

## 2024-02-03 NOTE — Discharge Instructions (Addendum)
 Toradol  and Zofran  given here today for pain and nausea.  I am sending some prescriptions to the pharmacy.  Recommend if your symptoms worsen you will need to go to the ER.

## 2024-02-03 NOTE — ED Triage Notes (Signed)
 Pt c/o lower abdominal pain since Tuesday she had ultrasound in July it showed follicle cyst her OB didn't seem like it was a issue. Pt reports the pain come in waves. Pt is nauseated due to the pain

## 2024-02-03 NOTE — Progress Notes (Signed)
  Because Ms. Sheree, I feel your condition warrants further evaluation and I recommend that you be seen in a face-to-face visit.   NOTE: There will be NO CHARGE for this E-Visit   If you are having a true medical emergency, please call 911.     For an urgent face to face visit, [facility] has multiple urgent care centers for your convenience.  Click the link below for the full list of locations and hours, walk-in wait times, appointment scheduling options and driving directions:  Urgent Care - [place], [place], [place], [place], [place], [place]  [place]     Your MyChart E-visit questionnaire answers were reviewed by a board certified advanced clinical practitioner to complete your personal care plan based on your specific symptoms.    Thank you for using e-Visits.

## 2024-02-03 NOTE — ED Provider Notes (Signed)
 [name]    CSN: [id number] Arrival date & time: 02/03/24  1805      History   Chief Complaint Chief Complaint  Patient presents with   Abdominal Pain    I have been having pain as if I have ovarian cysts. The pain has been so bad it has made me nauseous and running up and down my legs and back. - Entered by patient    HPI Sharon King is a 38 y.o. female.   Pt c/o lower abdominal pain since Tuesday she had ultrasound in July it showed follicle cyst her OB didn't seem like it was a issue. Pt reports the pain come in waves. Pt is nauseated due to the pain     Abdominal Pain   Past Medical History:  Diagnosis Date   Acute upper GI bleed 08/02/2019   Allergy    Anxiety    Asthma    Cancer (HCC)    GERD (gastroesophageal reflux disease)    Migraines    Neuromuscular disorder (HCC)    Seizures (HCC)    Last seizure 2014   Sleep apnea    pt denies   Trigeminal neuralgia    Uterine fibroid     Patient Active Problem List   Diagnosis Date Noted   Tear of left acetabular labrum 10/25/2023   Right ovarian cyst 09/30/2023   Adjustment disorder with mixed anxiety and depressed mood 01/20/2022   Urticaria 12/22/2019   Chronic migraine without aura 06/23/2019   Muscle spasm 02/18/2016   Allergic rhinitis 04/22/2007   Asthma 11/06/2006   Migraines 11/06/2006    Past Surgical History:  Procedure Laterality Date   BREAST ENHANCEMENT SURGERY  2008   BREAST SURGERY     CESAREAN SECTION N/A 06/13/2015   Procedure: CESAREAN SECTION;  Surgeon: Elveria Mungo, MD;  Location: [facility];  Service: Obstetrics;  Laterality: N/A;   COSMETIC SURGERY     ESOPHAGOGASTRODUODENOSCOPY (EGD) WITH PROPOFOL  N/A 08/30/2019   Procedure: ESOPHAGOGASTRODUODENOSCOPY (EGD) WITH PROPOFOL ;  Surgeon: Janalyn Keene NOVAK, MD;  Location: [facility];  Service: Endoscopy;  Laterality: N/A;   HIP ARTHROSCOPY W/ LABRAL REPAIR Left 10/25/2023   wisdom teeth      OB  History     Gravida  1   Para  1   Term      Preterm  1   AB      Living  1      SAB      IAB      Ectopic      Multiple  0   Live Births  1            Home Medications    Prior to Admission medications   Medication Sig Start Date End Date Taking? Authorizing Provider  omalizumab (XOLAIR) 150 MG/ML prefilled syringe Inject into the skin every 14 (fourteen) days.   Yes [provider]  ondansetron  (ZOFRAN -ODT) 4 MG disintegrating tablet Take 1 tablet (4 mg total) by mouth every 8 (eight) hours as needed for nausea or vomiting. 02/03/24  Yes Trinidad Petron A, FNP  Rimegepant Sulfate (NURTEC) 75 MG TBDP Take 1 tablet (75 mg total) by mouth as needed (migraine). 12/20/23   Nance Gaskins, Darice BRAVO, PA-C  topiramate  (TOPAMAX ) 200 MG tablet Take 1 tablet (200 mg total) by mouth daily. 11/26/23  Yes Teague Gaskins Darice BRAVO, PA-C  traMADol (ULTRAM) 50 MG tablet Take 1 tablet (50 mg total) by mouth every 12 (twelve) hours  as needed for up to 3 days. 02/03/24 02/06/24 Yes Petrita Blunck A, FNP  amoxicillin -clavulanate (AUGMENTIN ) 875-125 MG tablet Take 1 tablet by mouth 2 (two) times daily. 12/29/23   Vivienne Delon HERO, PA-C  brompheniramine-pseudoephedrine-DM 30-2-10 MG/5ML syrup Take 5 mLs by mouth 4 (four) times daily as needed. 12/29/23   Vivienne Delon HERO, PA-C  cetirizine  (ZYRTEC  ALLERGY) 10 MG tablet Take 1 tablet (10 mg total) by mouth at bedtime. 04/10/22 10/25/23  Joesph Shaver Scales, PA-C  EPINEPHrine  0.3 mg/0.3 mL IJ SOAJ injection Inject 0.3 mg into the muscle as needed for anaphylaxis. 12/26/19   Ashok Kathrine HERO, PA-C  famotidine  (PEPCID ) 20 MG tablet Take 1 tablet (20 mg total) by mouth 2 (two) times daily. 12/26/19 10/25/23  Ashok Kathrine HERO, PA-C  fluconazole  (DIFLUCAN ) 150 MG tablet Take 1 tablet (150 mg total) by mouth every 3 (three) days as needed. 12/29/23   Vivienne Delon HERO, PA-C  fluticasone  (FLONASE ) 50 MCG/ACT nasal spray Place 2 sprays into both  nostrils daily. 12/29/23   Vivienne Delon HERO, PA-C  hydrOXYzine  (ATARAX /VISTARIL ) 25 MG tablet TAKE ONE TABLET BY MOUTH EVERY 6 HOURS AS NEEDED FOR ITCHING 06/13/20   Ashok Kathrine M, PA-C  Lasmiditan  Succinate (REYVOW ) 100 MG TABS Take 1 tablet (100 mg total) by mouth as needed. 11/26/23   Nance Gaskins, Darice BRAVO, PA-C  norethindrone  (CAMILA ) 0.35 MG tablet Take 1 tablet (0.35 mg total) by mouth daily. 05/21/23   Izell Harari, MD  tiZANidine  (ZANAFLEX ) 4 MG tablet Take 1 tablet (4 mg total) by mouth at bedtime as needed for muscle spasms. 11/26/23   Nance Gaskins, Darice BRAVO, PA-C  Ubrogepant  (UBRELVY ) 100 MG TABS Take 1 tablet (100 mg total) by mouth as needed (migraine). 10/09/22   Nance Gaskins Darice BRAVO, PA-C    Family History Family History  Problem Relation Age of Onset   Hyperlipidemia Mother    Arthritis Mother    Depression Mother    Arthritis Father    Depression Father    Alcohol abuse Father     Social History Social History   Tobacco Use   Smoking status: Never   Smokeless tobacco: Former  Building services engineer status: Never Used  Substance Use Topics   Alcohol use: Yes    Comment: socially   Drug use: No     Allergies   Egg protein-containing drug products, Erythromycin, Paroxetine, and Sulfonamide derivatives   Review of Systems Review of Systems  Gastrointestinal:  Positive for abdominal pain.     Physical Exam Triage Vital Signs ED Triage Vitals  Encounter Vitals Group     BP 02/03/24 1815 (!) 159/101     Girls Systolic BP Percentile --      Girls Diastolic BP Percentile --      Boys Systolic BP Percentile --      Boys Diastolic BP Percentile --      Pulse Rate 02/03/24 1815 81     Resp 02/03/24 1815 20     Temp 02/03/24 1815 98.3 F (36.8 C)     Temp Source 02/03/24 1815 Oral     SpO2 02/03/24 1815 97 %     Weight --      Height --      Head Circumference --      Peak Flow --      Pain Score 02/03/24 1813 8     Pain Loc --      Pain Education  --  Exclude from Growth Chart --    No data found.  Updated Vital Signs BP (!) 159/101 (BP Location: Right Arm)   Pulse 81   Temp 98.3 F (36.8 C) (Oral)   Resp 20   LMP 01/12/2024 (Approximate)   SpO2 97%   Visual Acuity Right Eye Distance:   Left Eye Distance:   Bilateral Distance:    Right Eye Near:   Left Eye Near:    Bilateral Near:     Physical Exam   UC Treatments / Results  Labs (all labs ordered are listed, but only abnormal results are displayed) Labs Reviewed  POCT URINE DIPSTICK - Abnormal; Notable for the following components:      Result Value   Color, UA light yellow (*)    Leukocytes, UA Trace (*)    All other components within normal limits    EKG   Radiology No results found.  Procedures Procedures (including critical care time)  Medications Ordered in UC Medications  ketorolac  (TORADOL ) 30 MG/ML injection 30 mg (30 mg Intramuscular Given 02/03/24 1843)  ondansetron  (ZOFRAN -ODT) disintegrating tablet 4 mg (4 mg Oral Given 02/03/24 1843)    Initial Impression / Assessment and Plan / UC Course  I have reviewed the triage vital signs and the nursing notes.  Pertinent labs & imaging results that were available during my care of the patient were reviewed by me and considered in my medical decision making (see chart for details).     *** Final Clinical Impressions(s) / UC Diagnoses   Final diagnoses:  Lower abdominal pain     Discharge Instructions      Toradol  and Zofran  given here today for pain and nausea.  I am sending some prescriptions to the pharmacy.  Recommend if your symptoms worsen you will need to go to the ER.   ED Prescriptions     Medication Sig Dispense Auth. Provider   ondansetron  (ZOFRAN -ODT) 4 MG disintegrating tablet Take 1 tablet (4 mg total) by mouth every 8 (eight) hours as needed for nausea or vomiting. 20 tablet Jaquay Morneault A, FNP   traMADol (ULTRAM) 50 MG tablet Take 1 tablet (50 mg total) by mouth  every 12 (twelve) hours as needed for up to 3 days. 6 tablet Adah Corning A, FNP      I have reviewed the PDMP during this encounter.

## 2024-02-08 ENCOUNTER — Ambulatory Visit (HOSPITAL_BASED_OUTPATIENT_CLINIC_OR_DEPARTMENT_OTHER)

## 2024-02-08 NOTE — ED Provider Notes (Signed)
 [facility] eMERGENCY dEPARTMENT eNCOUnter     CLINICAL IMPRESSION Final diagnoses:  Pelvic pain (Primary)    ASSESSMENT & PLAN Medical Decision Making 38 year old female with a history of uterine fibroids presented with one week of intermittent pelvic pain, worsening last night with associated nausea and new onset heavy vaginal bleeding with clotting this morning, inconsistent with her usual menses. She reports regular cycles, no dysuria, and no fever. Exam notable for lower abdominal tenderness; urine analysis showed blood but no infection, and serum HCG was negative.  Differential diagnosis includes, but is not limited to: - Ovarian Cyst: Ovarian cyst considered due to acute pelvic pain and vaginal bleeding. Ultrasound shows some free pelvic fluid, possibly due to ruptured cyst. Normal R ovary, unable to visualize left.  - Ovarian torsion: ultrasound negative and labs unremarkable so this is less likely. - Menstrual pain and fibroids: Onset of menses considered as cause of her pain. She indicates bleeding is different than her usual. - Uterine Fibroid-Related Bleeding: History of uterine fibroids with prior imaging and chronic heavy periods may contribute to current symptoms. - Pregnancy-Related Complications: Pregnancy ruled out by negative serum HCG. -PID/infection. Reports no vaginal discharge. Has one partner for past 6 yrs and has had STI testing, declines repeat testing at this time.  -Endometriosis. Recommend further workup outpatient with gyn to evaluate  Acute pelvic pain with vaginal bleeding, menstrual/fibroid uterine pain vs. Ruptured cyst most likely. - Administered Toradol  and Tylenol  for pain - Administered Zofran  for nausea - Order pelvic ultrasound to evaluate for ovarian cyst or other abnormalities. This showed small amt free fluid, unable to visualize L ovary but R ovary WNL. - Hgb reassuring, advise follow up with PCP for recheck if ongoing heavy vaginal bleeding - Follow  up with your gynecologist for ongoing workup      Independent interpretation: Ultrasound(s) - vaginal ultrasound difficult to visualize L ovary, normal R, fibroid uterus I have reviewed recent and relavant previous record, including: Outpatient notes - prior outpt notes from urgent care    Diagnostic tests considered but not performed: CT scan abd/pelvis, deferred given normal labs and benign abd exam.   _______________________________________________________________  Time seen: February 08, 2024 1:39 PM  I have reviewed the triage vital signs and the nursing notes.  CHIEF COMPLAINT   Chief Complaint  Patient presents with  . Abdominal Pain  . Vaginal Bleeding    HPI  History of Present Illness Sharon King is a 38 year old female with fibroids who presents with pelvic pain and abnormal vaginal bleeding.  She has been experiencing pelvic pain that began last week while at work. The pain is intermittent, with slight improvement at times but recurring, and significantly increased in severity last night and this morning. The pain is associated with nausea occurring in waves. She has not taken any pain medication but did take Zofran  for nausea this morning.  She reports abnormal vaginal bleeding that started this morning, characterized by heavy clotting and bright red blood, unlike her usual menstrual cycle which typically begins with brown discharge. Her last menstrual period was in September. She has a history of heavy periods and had ultrasound in July.  She has a history of pelvic pain issues and previously consulted her gynecologist, who she has had trouble getting a response from this episode.  She underwent surgery for a labral tear in her hip over the summer, followed by physical therapy, and reports recovery from this condition.  No fever, dysuria, or burning with  urination. She is sexually active with the same partner for six years and has no concerns about STIs.  Recent tests for UTIs and STIs were negative, although her urine showed blood consistent with vaginal bleeding. She is allergic to sulfa and erythromycin.   PAST MEDICAL HISTORY   Past Medical History[1]  Problem List[2]  SURGICAL HISTORY   Past Surgical History[3]  CURRENT MEDICATIONS   No current facility-administered medications for this encounter. No current outpatient medications on file.  ALLERGIES   Allergies[4]  FAMILY HISTORY   Family History[5]  SOCIAL HISTORY Social History[6]   PHYSICAL EXAM    VITAL SIGNS:   ED Triage Vitals [02/08/24 1156]  Enc Vitals Group     BP 146/95     Pulse 89     SpO2 Pulse      Resp 18     Temp 36.7 C (98 F)     Temp Source Temporal     SpO2 100 %     Weight 83 kg (183 lb)     Height 1.626 m (5' 4)     Head Circumference      Peak Flow      Pain Score      Pain Loc      Pain Education      Exclude from Growth Chart    Physical Exam GENERAL: Alert, cooperative, well developed, no acute distress. HEENT: Normocephalic, normal oropharynx, moist mucous membranes. ABDOMEN: Soft, mildly tender B lower quadrants. Non-distended, without organomegaly, normal bowel sounds. No rebound or guarding. EXTREMITIES: No cyanosis or edema. NEUROLOGICAL: Cranial nerves grossly intact, moves all extremities without gross motor or sensory deficit.  RADIOLOGY   US  Endovaginal (Non-OB)  Final Result  -Heterogeneous fibroid uterus, including submucosal fibroid measuring up to 1.6 cm.  -Unremarkable right ovary.  -Nonvisualization of the left ovary. Limited evaluation of the endometrium.           LABS Labs Reviewed  COMPREHENSIVE METABOLIC PANEL - Abnormal; Notable for the following components:      Result Value   Chloride 111 (*)    CO2 21.0 (*)    All other components within normal limits  URINALYSIS WITH MICROSCOPY - Abnormal; Notable for the following components:   Protein, UA 30 mg/dL (*)    Blood, UA Large (*)    RBC, UA  4 (*)    All other components within normal limits  HCG QUANTITATIVE, BLOOD - Normal  CBC W/ DIFFERENTIAL   Narrative:    The following orders were created for panel order CBC w/ Differential.                 Procedure                               Abnormality         Status                                    ---------                               -----------         ------  CBC w/ [id number]]                             Final result                                               Please view results for these tests on the individual orders.  EXTRA TUBES   Narrative:    The following orders were created for panel order ED Extra Tubes.                 Procedure                               Abnormality         Status                                    ---------                               -----------         ------                                    LIGHT BLUE CITRATE EXTR.SABRASABRA[[id number]]                      Final result                                               Please view results for these tests on the individual orders.  CBC W/ AUTO DIFF  LIGHT BLUE CITRATE EXTRA TUBE   Narrative:    Collected and received in lab.     ATTESTATIONS   Portions of this record may have been created using Scientist, clinical (histocompatibility and immunogenetics). Dictation errors have been sought, but may not have been identified and corrected.       [1] Past Medical History: Diagnosis Date  . Migraines   [2] There is no problem list on file for this patient. [3] Past Surgical History: Procedure Laterality Date  . CESAREAN SECTION    . HIP ARTHROSCOPY Left   [4] Allergies Allergen Reactions  . Azithromycin  Other (See Comments)  . Egg Rash  . Paroxetine Palpitations  . Sulfa (Sulfonamide Antibiotics) Rash  [5] History reviewed. No pertinent family history. [6] Social History Tobacco Use  . Smoking status: Never  . Smokeless tobacco: Never  Substance and  Sexual Activity  . Alcohol use: Not Currently  . Drug use: Not Currently

## 2024-02-08 NOTE — ED Triage Notes (Signed)
 Intermittent lower abdominal pain and nausea x1 week. Seen at urgent care and dx with ovarian cyst. Vaginal bleeding starting this morning, LMP 12/16/23. Denies emesis, fever, urinary symptoms, vaginal symptoms. LBM yesterday

## 2024-02-14 ENCOUNTER — Encounter: Payer: Self-pay | Admitting: Obstetrics & Gynecology

## 2024-02-14 ENCOUNTER — Encounter: Payer: Self-pay | Admitting: Radiology

## 2024-02-14 ENCOUNTER — Ambulatory Visit (INDEPENDENT_AMBULATORY_CARE_PROVIDER_SITE_OTHER): Admitting: Obstetrics & Gynecology

## 2024-02-14 VITALS — BP 144/96 | HR 80 | Wt 193.0 lb

## 2024-02-14 DIAGNOSIS — R102 Pelvic and perineal pain unspecified side: Secondary | ICD-10-CM

## 2024-02-14 DIAGNOSIS — N83299 Other ovarian cyst, unspecified side: Secondary | ICD-10-CM | POA: Diagnosis not present

## 2024-02-14 DIAGNOSIS — N939 Abnormal uterine and vaginal bleeding, unspecified: Secondary | ICD-10-CM

## 2024-02-14 DIAGNOSIS — G8929 Other chronic pain: Secondary | ICD-10-CM

## 2024-02-14 MED ORDER — NORETHINDRONE ACETATE 5 MG PO TABS: 10.0000 mg | ORAL_TABLET | Freq: Every day | ORAL | 2 refills | Status: DC

## 2024-02-14 NOTE — Progress Notes (Signed)
 GYNECOLOGY OFFICE VISIT NOTE  History:  Sharon King is a 38 y.o. G1P0101 here today for discussion about chronic pelvic pain and recurrent ovarian cysts. Had recent visit to [facility] ER for this pain.  Also reports AUB due to her fibroids not controlled on the Micronor  she is taking.  Pain is constant, mostly on her left side as she was told she had a left ovarian cyst rupture. She denies any current abnormal vaginal discharge, bleeding or other concerns apart from her pain.  Past Medical History:  Diagnosis Date   Acute upper GI bleed 08/02/2019   Allergy    Anxiety    Asthma    GERD (gastroesophageal reflux disease)    Migraines    Neuromuscular disorder (HCC)    Seizures (HCC)    Last seizure 2014   Sleep apnea    pt denies   Trigeminal neuralgia    Uterine fibroid     Past Surgical History:  Procedure Laterality Date   BREAST ENHANCEMENT SURGERY  2008   BREAST SURGERY     CESAREAN SECTION N/A 06/13/2015   Procedure: CESAREAN SECTION;  Surgeon: Elveria Mungo, MD;  Location: [facility];  Service: Obstetrics;  Laterality: N/A;   COSMETIC SURGERY     ESOPHAGOGASTRODUODENOSCOPY (EGD) WITH PROPOFOL  N/A 08/30/2019   Procedure: ESOPHAGOGASTRODUODENOSCOPY (EGD) WITH PROPOFOL ;  Surgeon: Janalyn Keene NOVAK, MD;  Location: [facility] ENDOSCOPY;  Service: Endoscopy;  Laterality: N/A;   HIP ARTHROSCOPY W/ LABRAL REPAIR Left 10/25/2023   wisdom teeth      The following portions of the patient's history were reviewed and updated as appropriate: allergies, current medications, past family history, past medical history, past social history, past surgical history and problem list.   Health Maintenance:  Normal pap and negative HRHPV on 10/09/2022.    Review of Systems:  Pertinent items noted in HPI and remainder of comprehensive ROS otherwise negative.  Physical Exam:  BP (!) 144/96   Pulse 80   Wt 193 lb (87.5 kg)   LMP 01/12/2024 (Approximate)   BMI 33.13 kg/m   CONSTITUTIONAL: Well-developed, well-nourished female in no acute distress.  HEENT:  Normocephalic, atraumatic. External right and left ear normal. No scleral icterus.  NECK: Normal range of motion, supple, no masses noted on observation SKIN: No rash noted. Not diaphoretic. No erythema. No pallor. MUSCULOSKELETAL: Normal range of motion. No edema noted. NEUROLOGIC: Alert and oriented to person, place, and time. Normal muscle tone coordination. No cranial nerve deficit noted on observation. PSYCHIATRIC: Normal mood and affect. Normal behavior. Normal judgment and thought content. CARDIOVASCULAR: Normal heart rate noted RESPIRATORY: Effort and breath sounds normal, no problems with respiration noted ABDOMEN: Moderate LLQ and left mid abdominal pain on palpation.  No rebound or guarding. No masses or other overt distention noted on observation. No tenderness.   PELVIC: Deferred   Imaging (done at [facility], copied and pasted from CareEverywhere) EXAM: US  ENDOVAGINAL (NON-OB) ACCESSION: [id number] [facility] REPORT DATE: 02/08/2024 3:49 PM  CLINICAL INDICATION: 38 years old with pelvic pain concern for cyst   LMP: 02/08/2024  COMPARISON: None  TECHNIQUE: As per sonographer, the endovaginal pelvic procedure was explained to the patient and the patient verbally consented to the exam. Ultrasound views of the pelvis were obtained endovaginally and transabdominally using gray scale and color Doppler imaging. Spectral Doppler imaging was also performed.   FINDINGS:  UTERUS/CERVIX: Heterogeneous fibroid uterus was anteverted and measured 8.1 x 4.7 x 4.9 cm. Sequela of prior cesarean section. Multiple myometrial leiomyomas, for reference:  Submucosal fibroid measuring 1.6 x 1.1 x 1.4 cm, intramural posterior fibroid measuring 0.9 x 0.5 x 0.6 cm, intramural anterior fibroid measuring 0.7 x 0.4 x 0.6 cm. Limited evaluation of the endometrium due to adjacent fibroids. The partially visualized endometrium was normal  in thickness, measuring 0.54 cm.  OVARIES: The left ovary was not identified transvaginally or transabdominally due to overlying bowel gas. The right ovary was identified transvaginally. Small cystic areas were seen within the right ovary compatible with follicles. Appropriate arterial inflow and venous outflow of the right ovary was documented on color and spectral Doppler imaging. The right ovary measured 3.4 x 1.9 x 3.2 cm.  OTHER: Small volume pelvic free fluid.  IMPRESSION: -Heterogeneous fibroid uterus, including submucosal fibroid measuring up to 1.6 cm.  -Unremarkable right ovary. -Nonvisualization of the left ovary. Limited evaluation of the endometrium. Exam End: 02/08/24 15:57   Specimen Collected: 02/08/24 15:49 Last Resulted: 02/08/24 16:24  Received From: [facility]  Result Received: 02/14/24 11:46   No results found.  Assessment and Plan:     1. Chronic female pelvic pain (Primary) 2. Physiological ovarian cysts 3. Abnormal uterine bleeding (AUB) Discussed all her concerns, patient does not think her current hormonal therapy is working for her. She was on Sprintec in the past, was switched to Micronor  due to elevated BPs.  Offered trial of Aygestin  instead, she was hesitant to do this but will try it.  Discussed other management options including GnRH medications (Orilissa vs, Lupron), surgical management with hysterectomy and she does not want these options for now.  Wanted to schedule follow up appointment to see the effect of the Aygestin  in a few weeks, she declined this.  Recommended Ibuprofen  and Tylenol  as needed for pain.  Patient did not seem satisfied with the options given to her for management of her symptoms today. - norethindrone  (AYGESTIN ) 5 MG tablet; Take 2 tablets (10 mg total) by mouth daily.  Dispense: 60 tablet; Refill: 2  Return for follow up as recommended.    I spent 30 minutes dedicated to the care of this patient including pre-visit review of  records, face to face time with the patient discussing her conditions and treatments, post visit ordering of medications and appropriate tests or procedures, coordinating care and documenting this visit encounter.    GLORIS HUGGER, MD, FACOG Obstetrician & Gynecologist, [facility] for [employer], [facility]

## 2024-02-14 NOTE — Patient Instructions (Signed)
 Research Lupron  Research laparoscopic hysterectomy

## 2024-04-04 ENCOUNTER — Encounter: Payer: Self-pay | Admitting: Obstetrics & Gynecology

## 2024-04-04 ENCOUNTER — Ambulatory Visit (INDEPENDENT_AMBULATORY_CARE_PROVIDER_SITE_OTHER): Admitting: Obstetrics & Gynecology

## 2024-04-04 VITALS — BP 128/86 | HR 102 | Wt 170.0 lb

## 2024-04-04 DIAGNOSIS — Z7689 Persons encountering health services in other specified circumstances: Secondary | ICD-10-CM

## 2024-04-04 DIAGNOSIS — D259 Leiomyoma of uterus, unspecified: Secondary | ICD-10-CM | POA: Diagnosis not present

## 2024-04-04 DIAGNOSIS — R102 Pelvic and perineal pain unspecified side: Secondary | ICD-10-CM | POA: Diagnosis not present

## 2024-04-04 DIAGNOSIS — Z8742 Personal history of other diseases of the female genital tract: Secondary | ICD-10-CM

## 2024-04-04 NOTE — Progress Notes (Signed)
" ° ° °  GYNECOLOGY PROGRESS NOTE  Subjective:    Patient ID: Sharon King, female    DOB: 08-Aug-1985, 38 y.o.   MRN: [id number]  HPI  Patient is a 38 y.o. engaged G73P0101 (79 yo son) here as a new patient. She has a long h/o pelvic pain. In the past her periods were abnormal, severe pain and bleeding. She was started on OCPs in high school and this problem was resolved. Everything was good until she was changed from C OCPs to POP at age 71 due to HTN. She also has migraine with aura (rarely). Since changing to POPs her periods are still okay but pain is now an issue. The pain is sometimes daily, but sometimes not. It is a 8 on the pain scale. She has tried IBU/tylenol  with a little bit of help.  She is now taking NE 10 mg daily for about 3 months and does not have periods. She still has the pain. And she feels like she is having increased hair loss.   She has had several ultrasounds. The last one showed the following:EXAM: US  ENDOVAGINAL (NON-OB) ACCESSION: [id number] [facility] REPORT DATE: 02/08/2024 3:49 PM  CLINICAL INDICATION: 38 years old with pelvic pain concern for cyst   LMP: 02/08/2024  COMPARISON: None  TECHNIQUE: As per sonographer, the endovaginal pelvic procedure was explained to the patient and the patient verbally consented to the exam. Ultrasound views of the pelvis were obtained endovaginally and transabdominally using gray scale and color Doppler imaging. Spectral Doppler imaging was also performed.   FINDINGS:  UTERUS/CERVIX: Heterogeneous fibroid uterus was anteverted and measured 8.1 x 4.7 x 4.9 cm. Sequela of prior cesarean section. Multiple myometrial leiomyomas, for reference: Submucosal fibroid measuring 1.6 x 1.1 x 1.4 cm, intramural posterior fibroid measuring 0.9 x 0.5 x 0.6 cm, intramural anterior fibroid measuring 0.7 x 0.4 x 0.6 cm. Limited evaluation of the endometrium due to adjacent fibroids. The partially visualized endometrium was normal in thickness,  measuring 0.54 cm.  OVARIES: The left ovary was not identified transvaginally or transabdominally due to overlying bowel gas. The right ovary was identified transvaginally. Small cystic areas were seen within the right ovary compatible with follicles. Appropriate arterial inflow and venous outflow of the right ovary was documented on color and spectral Doppler imaging. The right ovary measured 3.4 x 1.9 x 3.2 cm.  OTHER: Small volume pelvic free fluid.  IMPRESSION: -Heterogeneous fibroid uterus, including submucosal fibroid measuring up to 1.6 cm.  -Unremarkable right ovary. -Nonvisualization of the left ovary. Limited evaluation of the endometrium.     Exam End: 02/08/24 15:57      The following portions of the patient's history were reviewed and updated as appropriate: allergies, current medications, past family history, past medical history, past social history, past surgical history, and problem list.  Review of Systems Pertinent items are noted in HPI.  Pap 01/2023 was normal. She is sexually active and denies pain.  Objective:   Blood pressure 128/86, pulse (!) 102, weight 170 lb (77.1 kg), last menstrual period 01/12/2024. Body mass index is 29.18 kg/m. Well nourished, well hydrated White female, no apparent distress She is ambulating and conversing normally. Pelvic exam deferred  Assessment:   Chronic pelvic pain Multiple small fibroids History of ovarian cysts  Plan:  I have suggested a trial of medical menopause with lupron or Orilissa.  She will message me with her decision.  "

## 2024-05-04 ENCOUNTER — Other Ambulatory Visit: Payer: Self-pay | Admitting: *Deleted

## 2024-05-04 ENCOUNTER — Encounter: Payer: Self-pay | Admitting: Obstetrics & Gynecology

## 2024-05-04 DIAGNOSIS — G8929 Other chronic pain: Secondary | ICD-10-CM

## 2024-05-04 DIAGNOSIS — N939 Abnormal uterine and vaginal bleeding, unspecified: Secondary | ICD-10-CM

## 2024-05-04 DIAGNOSIS — N83299 Other ovarian cyst, unspecified side: Secondary | ICD-10-CM

## 2024-05-04 MED ORDER — NORETHINDRONE ACETATE 5 MG PO TABS: 10.0000 mg | ORAL_TABLET | Freq: Every day | ORAL | 1 refills | Status: AC

## 2024-05-04 NOTE — Progress Notes (Signed)
 Refill sent for norethindrone  - pt needs 90 day supply. Advised to make follow up appt.

## 2024-05-09 ENCOUNTER — Telehealth: Payer: Self-pay | Admitting: Pharmacy Technician

## 2024-05-09 ENCOUNTER — Other Ambulatory Visit (HOSPITAL_COMMUNITY): Payer: Self-pay

## 2024-05-09 NOTE — Telephone Encounter (Signed)
 Pharmacy Patient Advocate Encounter  Received notification from [facility] that Prior Authorization for Nurtec 75MG  dispersible tablets has been APPROVED from 05/09/2024 to 05/09/2025. Ran test claim, Copay is $0.00. This test claim was processed through [facility]- copay amounts may vary at other pharmacies due to pharmacy/plan contracts, or as the patient moves through the different stages of their insurance plan.   PA #/Case ID/Reference #: [id number]

## 2024-05-09 NOTE — Telephone Encounter (Signed)
 Pharmacy Patient Advocate Encounter   Received notification from [employer] that prior authorization for Nurtec 75MG  dispersible tablets is required/requested.   Insurance verification completed.   The patient is insured through [employer].   Per test claim: PA required; PA submitted to above mentioned insurance via Latent Key/confirmation #/EOC [id number] Status is pending
